# Patient Record
Sex: Female | Born: 1960 | Race: White | Hispanic: No | Smoking: Never smoker
Health system: Southern US, Community
[De-identification: ages and names within clinical notes are randomized; demographics above are authoritative.]

## PROBLEM LIST (undated history)

## (undated) DIAGNOSIS — K219 Gastro-esophageal reflux disease without esophagitis: Secondary | ICD-10-CM

## (undated) DIAGNOSIS — N179 Acute kidney failure, unspecified: Secondary | ICD-10-CM

## (undated) DIAGNOSIS — E039 Hypothyroidism, unspecified: Secondary | ICD-10-CM

## (undated) DIAGNOSIS — E0500 Thyrotoxicosis with diffuse goiter without thyrotoxic crisis or storm: Secondary | ICD-10-CM

## (undated) DIAGNOSIS — I10 Essential (primary) hypertension: Secondary | ICD-10-CM

## (undated) DIAGNOSIS — G479 Sleep disorder, unspecified: Secondary | ICD-10-CM

## (undated) DIAGNOSIS — E7800 Pure hypercholesterolemia, unspecified: Secondary | ICD-10-CM

## (undated) DIAGNOSIS — F419 Anxiety disorder, unspecified: Secondary | ICD-10-CM

## (undated) DIAGNOSIS — E785 Hyperlipidemia, unspecified: Secondary | ICD-10-CM

## (undated) HISTORY — PX: OTHER SURGICAL HISTORY: SHX169

## (undated) HISTORY — DX: Anxiety disorder, unspecified: F41.9

## (undated) HISTORY — DX: Gastro-esophageal reflux disease without esophagitis: K21.9

## (undated) HISTORY — DX: Thyrotoxicosis with diffuse goiter without thyrotoxic crisis or storm: E05.00

## (undated) HISTORY — DX: Pure hypercholesterolemia, unspecified: E78.00

## (undated) HISTORY — DX: Hypothyroidism, unspecified: E03.9

## (undated) HISTORY — DX: Essential (primary) hypertension: I10

## (undated) HISTORY — DX: Hyperlipidemia, unspecified: E78.5

## (undated) HISTORY — PX: CARPAL TUNNEL RELEASE: SHX101

## (undated) HISTORY — DX: Sleep disorder, unspecified: G47.9

---
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
## 2011-06-13 ENCOUNTER — Other Ambulatory Visit (HOSPITAL_BASED_OUTPATIENT_CLINIC_OR_DEPARTMENT_OTHER): Payer: Self-pay | Admitting: Family Medicine

## 2011-06-13 DIAGNOSIS — E041 Nontoxic single thyroid nodule: Secondary | ICD-10-CM

## 2011-06-15 ENCOUNTER — Ambulatory Visit (HOSPITAL_BASED_OUTPATIENT_CLINIC_OR_DEPARTMENT_OTHER)
Admission: RE | Admit: 2011-06-15 | Discharge: 2011-06-15 | Disposition: A | Discharge status: Home / Self Care | Payer: 59 | Source: Ambulatory Visit | Attending: Family Medicine | Admitting: Family Medicine

## 2011-06-15 DIAGNOSIS — E0500 Thyrotoxicosis with diffuse goiter without thyrotoxic crisis or storm: Secondary | ICD-10-CM | POA: Insufficient documentation

## 2011-06-15 DIAGNOSIS — E041 Nontoxic single thyroid nodule: Secondary | ICD-10-CM

## 2013-05-08 ENCOUNTER — Ambulatory Visit: Payer: 59 | Admitting: Cardiology

## 2013-05-15 ENCOUNTER — Ambulatory Visit: Payer: 59 | Admitting: Cardiology

## 2013-05-21 ENCOUNTER — Encounter: Payer: Self-pay | Admitting: Cardiology

## 2013-05-21 ENCOUNTER — Encounter: Payer: Self-pay | Admitting: *Deleted

## 2013-05-21 DIAGNOSIS — E039 Hypothyroidism, unspecified: Secondary | ICD-10-CM | POA: Insufficient documentation

## 2013-05-21 DIAGNOSIS — E785 Hyperlipidemia, unspecified: Secondary | ICD-10-CM | POA: Insufficient documentation

## 2013-05-21 DIAGNOSIS — E7800 Pure hypercholesterolemia, unspecified: Secondary | ICD-10-CM | POA: Insufficient documentation

## 2013-05-21 DIAGNOSIS — K219 Gastro-esophageal reflux disease without esophagitis: Secondary | ICD-10-CM | POA: Insufficient documentation

## 2013-05-21 DIAGNOSIS — F419 Anxiety disorder, unspecified: Secondary | ICD-10-CM | POA: Insufficient documentation

## 2013-05-21 DIAGNOSIS — E0500 Thyrotoxicosis with diffuse goiter without thyrotoxic crisis or storm: Secondary | ICD-10-CM | POA: Insufficient documentation

## 2013-05-21 DIAGNOSIS — G479 Sleep disorder, unspecified: Secondary | ICD-10-CM | POA: Insufficient documentation

## 2013-05-23 ENCOUNTER — Ambulatory Visit (INDEPENDENT_AMBULATORY_CARE_PROVIDER_SITE_OTHER): Payer: 59 | Admitting: Cardiology

## 2013-05-23 ENCOUNTER — Encounter: Payer: Self-pay | Admitting: Cardiology

## 2013-05-23 ENCOUNTER — Encounter (INDEPENDENT_AMBULATORY_CARE_PROVIDER_SITE_OTHER): Payer: Self-pay

## 2013-05-23 VITALS — BP 130/92 | HR 82 | Ht 65.0 in | Wt 169.4 lb

## 2013-05-23 DIAGNOSIS — F411 Generalized anxiety disorder: Secondary | ICD-10-CM

## 2013-05-23 DIAGNOSIS — Z8249 Family history of ischemic heart disease and other diseases of the circulatory system: Secondary | ICD-10-CM

## 2013-05-23 DIAGNOSIS — F419 Anxiety disorder, unspecified: Secondary | ICD-10-CM

## 2013-05-23 DIAGNOSIS — I10 Essential (primary) hypertension: Secondary | ICD-10-CM

## 2013-05-23 NOTE — Patient Instructions (Signed)
Your physician recommends that you continue on your current medications as directed. Please refer to the Current Medication list given to you today.  Your physician wants you to follow-up in: 1 year with Dr. Skains. You will receive a reminder letter in the mail two months in advance. If you don't receive a letter, please call our office to schedule the follow-up appointment.  

## 2013-05-23 NOTE — Progress Notes (Signed)
[address] [place], [place]  [address] Phone: [phone] Fax:  [phone]  Date:  05/23/2013   ID:  Kathryn [name], DOB 10/19/1960, MRN [id number]  PCP:  Joycelyn Rua, MD   History of Present Illness: Kathryn [name] is a 52 y.o. female with family hx of CAD with mother and brother having myocardial infarction at age 35, and she also has hx of hyperlipidemia, hypertension,and insulin resistance.  08/26/12 she saw Lovenia Kim who added norvasc 5 mg due to hypertension. She states she feels it has not really helped and that for the last few weeks she has been getting blood pressure readings in the 175-212/116-129 range.   On 09/02/12 with BP still elevated her Amlodipine was further increased to 10 mg po qd. Readings since then 160/104, 148/102, 177/105, She is very anxious, has headaches and feels exhausted. She previously was on Losartan HCT 100/25 mg qd and was switched due to her formulary and she is now on Triamterene/HCTZ, without losartan since 2/14.  10/03/12-blood pressure is better but still elevated at times. Fluctuating. I decided to increase her carvedilol to 12.5 mg twice a day. Continue with amlodipine 10 mg a day. Her heart rate should be able to tolerate this. Patient denies chest pain, palpitations, syncope, swelling, SOB, nor PND  05/23/13-unfortunately had labrum tear, biceps tear and currently is in a sling, right-sided. Undergoing physical therapy. No problems with chest pain, shortness of breath, syncope. Her blood pressure is actually under pretty good control.    Wt Readings from Last 3 Encounters:  05/23/13 169 lb 6.4 oz (76.839 kg)     Past Medical History  Diagnosis Date  . GERD (gastroesophageal reflux disease)   . Hyperlipidemia   . Hypothyroidism   . Anxiety   . Sleep disturbance   . Hypercholesteremia   . Graves disease     with hyperthyroidism diagnosed at age 89 (treated with propylthiouracil for 2 years)    Past Surgical  History  Procedure Laterality Date  . Rt ovary and fallopian tube removed due to cysts    . Carpal tunnel release      Current Outpatient Prescriptions  Medication Sig Dispense Refill  . ALPRAZolam (XANAX) 0.5 MG tablet Take 0.5 mg by mouth at bedtime as needed for anxiety.      Marland Kitchen amLODipine (NORVASC) 10 MG tablet Take 10 mg by mouth daily.      Marland Kitchen aspirin 81 MG tablet Take 81 mg by mouth daily.      . B Complex-C (SUPER B COMPLEX PO) Take 1 tablet by mouth daily.      Marland Kitchen BLACK COHOSH PO Take 135 mg by mouth daily.      . carvedilol (COREG) 6.25 MG tablet Take 6.25 mg by mouth 2 (two) times daily with a meal.      . cholecalciferol (VITAMIN D) 1000 UNITS tablet Take 1,000 Units by mouth 2 (two) times daily.      Marland Kitchen desvenlafaxine (PRISTIQ) 50 MG 24 hr tablet Take 50 mg by mouth daily.      Marland Kitchen estrogens, conjugated, (PREMARIN) 0.625 MG tablet Take 0.625 mg by mouth once a week. Take daily for 21 days then do not take for 7 days.      Marland Kitchen levothyroxine (SYNTHROID, LEVOTHROID) 75 MCG tablet Take 75 mcg by mouth daily before breakfast.      . lisdexamfetamine (VYVANSE) 40 MG capsule Take 40 mg by mouth every morning.      Marland Kitchen  losartan (COZAAR) 100 MG tablet Take 100 mg by mouth daily.      Marland Kitchen lovastatin (MEVACOR) 40 MG tablet Take 40 mg by mouth at bedtime.      . metFORMIN (GLUCOPHAGE-XR) 750 MG 24 hr tablet Take 750 mg by mouth daily with breakfast.      . traZODone (DESYREL) 100 MG tablet Take 50 mg by mouth as needed for sleep.      Marland Kitchen triamterene-hydrochlorothiazide (DYAZIDE) 37.5-25 MG per capsule Take 1 capsule by mouth daily.      . valACYclovir (VALTREX) 500 MG tablet Take 500 mg by mouth as needed.       No current facility-administered medications for this visit.    Allergies:   Not on File  Social History:  The patient   is a nonsmoker  ROS:  Please see the history of present illness.   Denies any syncope, bleeding, orthopnea, PND. Occasional ankle edema.    PHYSICAL EXAM: VS:  BP  130/92  Pulse 82  Ht 5\' 5"  (1.651 m)  Wt 169 lb 6.4 oz (76.839 kg)  BMI 28.19 kg/m2 Well nourished, well developed, in no acute distress HEENT: normal Neck: no JVD Cardiac:  normal S1, S2; RRR; no murmur Lungs:  clear to auscultation bilaterally, no wheezing, rhonchi or rales Abd: soft, nontender, no hepatomegaly Ext: no edema Skin: warm and dry Neuro: no focal abnormalities noted  EKG:  Sinus rhythm, rate 82.  Nuclear stress test-2012-no ischemia, normal EF  ASSESSMENT AND PLAN:  1. Strong family history of CAD-mother and brother MI at age 98. Continue with aggressive primary prevention. Blood pressure control, statin. Exercise. 2. Hypertension-currently well controlled. It is likely that some of her mild ankle edema is secondary from amlodipine side effect. Nonetheless, continue.  Signed, Donato Schultz, MD [facility]  05/23/2013 2:59 PM

## 2013-07-22 ENCOUNTER — Other Ambulatory Visit: Payer: Self-pay | Admitting: Obstetrics and Gynecology

## 2013-07-22 DIAGNOSIS — R928 Other abnormal and inconclusive findings on diagnostic imaging of breast: Secondary | ICD-10-CM

## 2013-07-30 ENCOUNTER — Ambulatory Visit
Admission: RE | Admit: 2013-07-30 | Discharge: 2013-07-30 | Disposition: A | Discharge status: Home / Self Care | Payer: No Typology Code available for payment source | Source: Ambulatory Visit | Attending: Obstetrics and Gynecology | Admitting: Obstetrics and Gynecology

## 2013-07-30 DIAGNOSIS — R928 Other abnormal and inconclusive findings on diagnostic imaging of breast: Secondary | ICD-10-CM

## 2013-11-27 ENCOUNTER — Other Ambulatory Visit (HOSPITAL_COMMUNITY): Payer: Self-pay | Admitting: Ophthalmology

## 2013-11-27 ENCOUNTER — Ambulatory Visit (HOSPITAL_COMMUNITY)
Admission: RE | Admit: 2013-11-27 | Discharge: 2013-11-27 | Disposition: A | Discharge status: Home / Self Care | Payer: No Typology Code available for payment source | Source: Ambulatory Visit | Attending: Ophthalmology | Admitting: Ophthalmology

## 2013-11-27 DIAGNOSIS — G319 Degenerative disease of nervous system, unspecified: Secondary | ICD-10-CM | POA: Insufficient documentation

## 2013-11-27 DIAGNOSIS — J3489 Other specified disorders of nose and nasal sinuses: Secondary | ICD-10-CM | POA: Insufficient documentation

## 2013-11-27 DIAGNOSIS — H469 Unspecified optic neuritis: Secondary | ICD-10-CM | POA: Insufficient documentation

## 2013-11-27 DIAGNOSIS — H4710 Unspecified papilledema: Secondary | ICD-10-CM

## 2013-11-27 DIAGNOSIS — R930 Abnormal findings on diagnostic imaging of skull and head, not elsewhere classified: Secondary | ICD-10-CM | POA: Insufficient documentation

## 2013-11-27 MED ORDER — GADOBENATE DIMEGLUMINE 529 MG/ML IV SOLN
15.0000 mL | Freq: Once | INTRAVENOUS | Status: AC | PRN
  Administered 2013-11-27: 15 mL via INTRAVENOUS

## 2013-12-04 ENCOUNTER — Inpatient Hospital Stay (HOSPITAL_COMMUNITY)
Admission: AD | Admit: 2013-12-04 | Payer: No Typology Code available for payment source | Source: Ambulatory Visit | Admitting: Internal Medicine

## 2013-12-05 ENCOUNTER — Ambulatory Visit (INDEPENDENT_AMBULATORY_CARE_PROVIDER_SITE_OTHER): Payer: No Typology Code available for payment source | Admitting: Neurology

## 2013-12-05 ENCOUNTER — Encounter: Payer: Self-pay | Admitting: Neurology

## 2013-12-05 VITALS — BP 138/80 | HR 81 | Ht 65.0 in | Wt 173.1 lb

## 2013-12-05 DIAGNOSIS — H469 Unspecified optic neuritis: Secondary | ICD-10-CM | POA: Insufficient documentation

## 2013-12-05 NOTE — Progress Notes (Signed)
NEUROLOGY CONSULTATION NOTE  Kathryn [name] MRN: [id number] DOB: 02/15/1981  Referring provider: Dr. Stephannie Li Primary care provider: Dr. Joycelyn Rua  Reason for consult:  Optic neuritis  Dear Dr Allyne Gee:  Thank you for your kind referral of Kathryn [name] for consultation of the above symptoms. Although her history is well known to you, please allow me to reiterate it for the purpose of our medical record. The patient was accompanied to the clinic by her husband who also provides collateral information. Records and images were personally reviewed where available.  HISTORY OF PRESENT ILLNESS: This is a pleasant 53 year old right-handed woman with vascular risk factors including hypertension, hyperlipidemia, and "insulin resistance" on Metformin, presenting for evaluation of right optic neuritis.  She woke up with a change in her vision on the right eye last 11/17/2013.  She denied any eye pain, stating that there was a sheer curtain in her vision that made everything look gray or brown.  She could not make out the details behind the sheer curtain.  She had seen retina specialist Dr. Allyne Gee, diagnosed with optic neuritis OD, with note of visual acuity of 20/800 OD and subtle APD.  She returned a week later on 12/03/2013 reporting that the grayish brown spot was bigger that she could see clearly only above the person's hairline. Visual acuity was improved at 20/350-1.  She started having bilateral temporal headaches, more on the right side, occurring multiple times a day, lasting up to 2 hours.  There is no associated nausea/vomiting, she has some sensitivity to bright lights and loud sounds. She has been taking Ibuprofen 2 tabs twice a day for the past 2 weeks.  The left eye is unaffected.  She denies any focal numbness/tingling/weakness, but reports that she occasionally has numbness in both feet when her ankles swell up.  She does feel the grip on her right hand is weaker, but has  attributed this to right rotator cuff surgery last October 2014.  She denies any prior history of vision loss.  She denies any neck/back pain, dysarthria/dysphagia, negative Lhermitte's sign.  No bowel/bladder dysfunction except for urinary urgency. No family history of similar symptoms.  IV steroids have been arranged by her ophthalmologist, and she had just received the first dose of IV Solumedrol 250mg  every 6 hours this morning. She will be administering the medication herself.  She has had hypertension and hyperlipidemia for at least 10 years. She reports that last year her BP was fluctuating and labile, but recently better controlled. She has a history of spinal meningitis in 1992 when she had headaches and neck pain.  I personally reviewed MRI brain and orbits with and without contrast done 11/27/2013 which showed bilateral non-enhancing small foci of white matter T2/FLAIR signal abnormality, none clearly seen radially along the corpus callosum.  There was no clear abnormal signal seen in the right optic nerve.  PAST MEDICAL HISTORY: Past Medical History  Diagnosis Date  . GERD (gastroesophageal reflux disease)   . Hyperlipidemia   . Hypothyroidism   . Anxiety   . Sleep disturbance   . Hypercholesteremia   . Graves disease     with hyperthyroidism diagnosed at age 18 (treated with propylthiouracil for 2 years)    PAST SURGICAL HISTORY: Past Surgical History  Procedure Laterality Date  . Rt ovary and fallopian tube removed due to cysts    . Carpal tunnel release      MEDICATIONS: Current Outpatient Prescriptions on File Prior to Visit  Medication Sig Dispense Refill  . ALPRAZolam (XANAX) 0.5 MG tablet Take 0.5 mg by mouth at bedtime as needed for anxiety.      Marland Kitchen amLODipine (NORVASC) 10 MG tablet Take 10 mg by mouth daily.      Marland Kitchen aspirin 81 MG tablet Take 81 mg by mouth daily.      . B Complex-C (SUPER B COMPLEX PO) Take 1 tablet by mouth daily.      Marland Kitchen BLACK COHOSH PO Take 135  mg by mouth daily.      . carvedilol (COREG) 6.25 MG tablet Take 6.25 mg by mouth 2 (two) times daily with a meal.      . cholecalciferol (VITAMIN D) 1000 UNITS tablet Take 1,000 Units by mouth 2 (two) times daily.      Marland Kitchen desvenlafaxine (PRISTIQ) 50 MG 24 hr tablet Take 50 mg by mouth daily.      Marland Kitchen estrogens, conjugated, (PREMARIN) 0.625 MG tablet Take 0.625 mg by mouth once a week. Take daily for 21 days then do not take for 7 days.      Marland Kitchen levothyroxine (SYNTHROID, LEVOTHROID) 75 MCG tablet Take 75 mcg by mouth daily before breakfast.      . lisdexamfetamine (VYVANSE) 40 MG capsule Take 40 mg by mouth every morning.      Marland Kitchen losartan (COZAAR) 100 MG tablet Take 100 mg by mouth daily.      Marland Kitchen lovastatin (MEVACOR) 40 MG tablet Take 40 mg by mouth at bedtime.      . metFORMIN (GLUCOPHAGE-XR) 750 MG 24 hr tablet Take 750 mg by mouth daily with breakfast.      . traZODone (DESYREL) 100 MG tablet Take 50 mg by mouth as needed for sleep.      Marland Kitchen triamterene-hydrochlorothiazide (DYAZIDE) 37.5-25 MG per capsule Take 1 capsule by mouth daily.      . valACYclovir (VALTREX) 500 MG tablet Take 500 mg by mouth as needed.       No current facility-administered medications on file prior to visit.    ALLERGIES: No Known Allergies  FAMILY HISTORY: History reviewed. No pertinent family history.  SOCIAL HISTORY: History   Social History  . Marital Status: Married    Spouse Name: N/A    Number of Children: N/A  . Years of Education: N/A   Occupational History  . Not on file.   Social History Main Topics  . Smoking status: Never Smoker   . Smokeless tobacco: Not on file  . Alcohol Use: Yes  . Drug Use: No  . Sexual Activity: Not on file   Other Topics Concern  . Not on file   Social History Narrative  . No narrative on file    REVIEW OF SYSTEMS: Constitutional: No fevers, chills, or sweats, no generalized fatigue, change in appetite Eyes: as above Ear, nose and throat: No hearing loss, ear  pain, nasal congestion, sore throat Cardiovascular: No chest pain, palpitations Respiratory:  No shortness of breath at rest or with exertion, wheezes GastrointestinaI: No nausea, vomiting, diarrhea, abdominal pain, fecal incontinence Genitourinary:  No dysuria, urinary retention or frequency Musculoskeletal:  No neck pain, back pain Integumentary: No rash, pruritus, skin lesions Neurological: as above Psychiatric: No depression, insomnia, anxiety Endocrine: No palpitations, fatigue, diaphoresis, mood swings, change in appetite, change in weight, increased thirst Hematologic/Lymphatic:  No anemia, purpura, petechiae. Allergic/Immunologic: no itchy/runny eyes, nasal congestion, recent allergic reactions, rashes  PHYSICAL EXAM: Filed Vitals:   12/05/13 1609  BP: 138/80  Pulse: 81  General: No acute distress Head:  Normocephalic/atraumatic Eyes: Fundoscopic exam shows blurred disc on the right, sharp on left.   Neck: supple, no paraspinal tenderness, full range of motion Back: No paraspinal tenderness Heart: regular rate and rhythm Lungs: Clear to auscultation bilaterally. Vascular: No carotid bruits. Skin/Extremities: No rash, no edema Neurological Exam: Mental status: alert and oriented to person, place, and time, no dysarthria or aphasia, Fund of knowledge is appropriate.  Recent and remote memory are intact.  Attention and concentration are normal.    Able to name objects and repeat phrases. Cranial nerves: CN I: not tested CN II: pupils equal, round and reactive to light, no APD noted today. visual fields intact but has some difficulty with inferior field on the right eye. Fundoscopy as above CN III, IV, VI:  full range of motion (no pain with eye movements), no nystagmus, no ptosis CN V: facial sensation intact CN VII: upper and lower face symmetric CN VIII: hearing intact to finger rub CN IX, X: gag intact, uvula midline CN XI: sternocleidomastoid and trapezius muscles  intact CN XII: tongue midline Bulk & Tone: normal, no fasciculations. Motor: 5/5 throughout with no pronator drift. Sensation: intact to light touch, cold, pin, vibration and joint position sense.  No extinction to double simultaneous stimulation.  Romberg test negative Deep Tendon Reflexes: +2 throughout, no ankle clonus Plantar responses: downgoing bilaterally Cerebellar: no incoordination on finger to nose, heel to shin. No dysdiadochokinesia Gait: narrow-based and steady, able to tandem walk adequately. Tremor: none  IMPRESSION: This is a pleasant 53 year old right-handed woman with a history of hypertension, hyperlipidemia, insulin resistance on Metformin, presenting with right optic neuritis with abnormal MRI brain.  I reviewed the MRI brain with and without contrast with the patient and her husband.  There were several bilateral white matter lesions that were non-enhancing, although there was a large amount of these, similar findings can be seen in patients with chronic small vessel disease.  Certainly in a patient with new optic neuritis, MS is a consideration, and further evaluation with an MRI C-spine and T-spine with and without contrast to evaluate for spinal lesions will be ordered.  A lumbar puncture under fluoroscopy to send for CSF cell count, protein, glucose, and oligoclonal bands will be ordered, in addition to other MS mimickers.  Bloodwork for HbA1c, TSH, CRP, vitamin B12, vitamin D, ANA, ACE level, RPR, NMO Ab will be ordered.  We discussed the typical prognosis of optic neuritis, vision usually beings to improve over a few weeks and can continue over months, IV steroids can hasten clinical improvement, however long-term visual outcome has not been found to be affected by this treatment.  She will finish the IV steroid course and  follow-up after the tests.  Thank you for allowing me to participate in the care of this patient. Please do not hesitate to call for any questions or  concerns.   Patrcia DollyKaren Aquino, M.D.  CC: Dr. Stephannie LiJason Sanders

## 2013-12-05 NOTE — Patient Instructions (Signed)
1. MRI cervical and thoracic spine with and without contrast 2. Schedule lumbar puncture 3. Continue IV steroids 4 .Minimize Ibuprofen intake

## 2013-12-09 ENCOUNTER — Other Ambulatory Visit: Payer: Self-pay | Admitting: Radiology

## 2013-12-09 ENCOUNTER — Encounter: Payer: Self-pay | Admitting: Neurology

## 2013-12-09 ENCOUNTER — Telehealth: Payer: Self-pay | Admitting: Neurology

## 2013-12-09 NOTE — Telephone Encounter (Signed)
Delice Bison from [facility] needs to have notes faxed to her at [phone] phone number is [phone]

## 2013-12-09 NOTE — Telephone Encounter (Signed)
Please let them know that note is done. Also, can you pls order the bloodwork I detailed on my note. Thanks!!!

## 2013-12-09 NOTE — Telephone Encounter (Signed)
[facility] health radiology department requesting a finished note. Patient not seen until Friday advise them to check again by Thursday evening patient was just seen on Friday afternoon.

## 2013-12-10 ENCOUNTER — Other Ambulatory Visit: Payer: Self-pay | Admitting: *Deleted

## 2013-12-10 ENCOUNTER — Other Ambulatory Visit: Payer: Self-pay | Admitting: Neurology

## 2013-12-10 ENCOUNTER — Telehealth: Payer: Self-pay | Admitting: Neurology

## 2013-12-10 DIAGNOSIS — H469 Unspecified optic neuritis: Secondary | ICD-10-CM

## 2013-12-10 LAB — C-REACTIVE PROTEIN: CRP: <0.5 mg/dL (ref <0.60)

## 2013-12-10 LAB — VITAMIN B12: VITAMIN B 12: 1326 pg/mL — AB (ref 211–911)

## 2013-12-10 LAB — TSH: TSH: 3.042 u[IU]/mL (ref 0.350–4.500)

## 2013-12-10 LAB — HEMOGLOBIN A1C
HEMOGLOBIN A1C: 5.9 % — AB (ref <5.7)
Mean Plasma Glucose: 123 mg/dL — ABNORMAL HIGH (ref <117)

## 2013-12-10 NOTE — Telephone Encounter (Signed)
Pt came by the office to pick up her lab work and wanted to ask what she could do or take for her headaches.

## 2013-12-10 NOTE — Telephone Encounter (Signed)
Please advise 

## 2013-12-10 NOTE — Telephone Encounter (Signed)
Patient notified about extra labs requested

## 2013-12-10 NOTE — Telephone Encounter (Signed)
Left voicemail, can try Aleve 2 tabs BID for now. If not effective, will consider Tramadol.

## 2013-12-11 LAB — RPR

## 2013-12-11 LAB — ANGIOTENSIN CONVERTING ENZYME: ANGIOTENSIN-CONVERTING ENZYME: 17 U/L (ref 8–52)

## 2013-12-11 LAB — VITAMIN D 25 HYDROXY (VIT D DEFICIENCY, FRACTURES): VIT D 25 HYDROXY: 44 ng/mL (ref 30–89)

## 2013-12-11 LAB — ANA: Anti Nuclear Antibody(ANA): NEGATIVE

## 2013-12-11 NOTE — Telephone Encounter (Signed)
Finished the IV steroids last Monday. Has HAs bounding all over the place. Has not noticed any improvement in the eye. Started yesterday on oral prednisone 20mg  TID (30 tabs), no taper instructions given by ophtho. She will call about instructions.  She had a 2-hour HA-free period this morning.  She is scheduled for LP tomorrow. Can use prn Tylenol while on steroids. She knows to call to update [place].

## 2013-12-12 ENCOUNTER — Ambulatory Visit (HOSPITAL_COMMUNITY)
Admission: RE | Admit: 2013-12-12 | Discharge: 2013-12-12 | Disposition: A | Discharge status: Home / Self Care | Payer: No Typology Code available for payment source | Source: Ambulatory Visit | Attending: Neurology | Admitting: Neurology

## 2013-12-12 DIAGNOSIS — H469 Unspecified optic neuritis: Secondary | ICD-10-CM | POA: Diagnosis not present

## 2013-12-12 LAB — GLUCOSE, CSF: Glucose, CSF: 75 mg/dL (ref 43–76)

## 2013-12-12 LAB — CSF CELL COUNT WITH DIFFERENTIAL
RBC Count, CSF: 3 /mm3 — ABNORMAL HIGH
Tube #: 3
WBC, CSF: 3 /mm3 (ref 0–5)

## 2013-12-12 LAB — PROTEIN, CSF: Total  Protein, CSF: 36 mg/dL (ref 15–45)

## 2013-12-12 NOTE — Discharge Instructions (Signed)
Lumbar Puncture Discharge Instructions ° °1. Go home and rest quietly for the next 24 hours.  It is important to lie flat for the next 24 hours.  Get up only to go to the restroom.  You may lie in the bed or on a couch on your back, your stomach, your left side or your right side.  You may have one pillow under your head.  You may have pillows between your knees while you are on your side or under your knees while you are on your back. ° °2. DO NOT drive today.  Recline the seat as far back as it will go, while still wearing your seat belt, on the way home. ° °3. You may get up to go to the bathroom as needed.  You may sit up for 10 minutes to eat.  You may resume your normal diet and medications unless otherwise indicated. ° °4. The incidence of headache, nausea, or vomiting is about 5% (one in 20 patients).  If you develop a headache, lie flat and drink plenty of fluids until the headache goes away.  Caffeinated beverages may be helpful.  If you develop severe nausea and vomiting or a headache that does not go away with flat bed rest, call [phone]. ° °5. You may resume normal activities after your 24 hours of bed rest is over; however, do not exert yourself strongly or do any heavy lifting tomorrow. ° °6. Call your physician for a follow-up appointment.  The results of your myelogram will be sent directly to your physician by the following day. ° °7. If you have any questions or if complications develop after you arrive home, please call [phone]. ° °Discharge instructions have been explained to the patient.  The patient, or the person responsible for the patient, fully understands these instructions. ° ° °

## 2013-12-12 NOTE — Procedures (Signed)
CLINICAL DATA: [Optic neuritis, possible multiple sclerosis.]  EXAM:  DIAGNOSTIC LUMBAR PUNCTURE UNDER FLUOROSCOPIC GUIDANCE  FLUOROSCOPY TIME: [0 min, 22 seconds]  PROCEDURE:  I discussed the risks (including hemorrhage, infection, headache, and nerve damage, among others), benefits, and alternatives to fluoroscopically guided lumbar puncture with the patient.  We specifically discussed the high technical likelihood of success of the procedure. The patient understood and elected to undergo the procedure.      Standard time-out was employed.  Following sterile skin prep and local anesthetic administration consisting of 1 percent lidocaine, a 22 gauge spinal needle was advanced without difficulty into the thecal sac at the at the [L3-4] level.  Clear CSF was returned.  Opening pressure was [19 cm of water].      18 cc of clear CSF was collected.  The needle was subsequently removed and the skin cleansed and bandaged.  No immediate complications were observed.       IMPRESSION: [ Technically successful fluoroscopically guided lumbar puncture yielding 18  cc of clear CSF for testing.  Opening pressure was 19 cm of water.   ]

## 2013-12-14 LAB — ANGIOTENSIN CONVERTING ENZYME, CSF: ANGIO CONVERT ENZYME: 3 U/L (ref <=15)

## 2013-12-15 LAB — MYELIN BASIC PROTEIN, CSF

## 2013-12-16 LAB — NEUROMYELITIS OPTICA AUTOAB, IGG: NMO-IgG: NEGATIVE

## 2013-12-17 LAB — CSF IGG: IGG CSF: 2.4 mg/dL (ref 0.8–7.7)

## 2013-12-19 ENCOUNTER — Ambulatory Visit (HOSPITAL_COMMUNITY)
Admission: RE | Admit: 2013-12-19 | Discharge: 2013-12-19 | Disposition: A | Discharge status: Home / Self Care | Payer: 59 | Source: Ambulatory Visit | Attending: Neurology | Admitting: Neurology

## 2013-12-19 ENCOUNTER — Ambulatory Visit (HOSPITAL_COMMUNITY): Payer: 59

## 2013-12-19 DIAGNOSIS — M5124 Other intervertebral disc displacement, thoracic region: Secondary | ICD-10-CM | POA: Insufficient documentation

## 2013-12-19 DIAGNOSIS — R209 Unspecified disturbances of skin sensation: Secondary | ICD-10-CM | POA: Insufficient documentation

## 2013-12-19 DIAGNOSIS — M5030 Other cervical disc degeneration, unspecified cervical region: Secondary | ICD-10-CM | POA: Insufficient documentation

## 2013-12-19 DIAGNOSIS — N289 Disorder of kidney and ureter, unspecified: Secondary | ICD-10-CM | POA: Diagnosis not present

## 2013-12-19 DIAGNOSIS — H469 Unspecified optic neuritis: Secondary | ICD-10-CM

## 2013-12-19 DIAGNOSIS — M5020 Other cervical disc displacement, unspecified cervical region: Secondary | ICD-10-CM | POA: Diagnosis not present

## 2013-12-19 LAB — OLIGOCLONAL BANDS, CSF + SERM

## 2013-12-19 MED ORDER — GADOBENATE DIMEGLUMINE 529 MG/ML IV SOLN
16.0000 mL | Freq: Once | INTRAVENOUS | Status: AC | PRN
  Administered 2013-12-19: 16 mL via INTRAVENOUS

## 2013-12-23 LAB — B. BURGDORFI ANTIBODIES, CSF: Lyme Ab: NEGATIVE

## 2014-01-02 ENCOUNTER — Ambulatory Visit (INDEPENDENT_AMBULATORY_CARE_PROVIDER_SITE_OTHER): Payer: No Typology Code available for payment source | Admitting: Neurology

## 2014-01-02 ENCOUNTER — Encounter: Payer: Self-pay | Admitting: Neurology

## 2014-01-02 VITALS — BP 140/80 | HR 101 | Ht 65.0 in | Wt 173.1 lb

## 2014-01-02 DIAGNOSIS — H469 Unspecified optic neuritis: Secondary | ICD-10-CM

## 2014-01-02 NOTE — Progress Notes (Signed)
NEUROLOGY FOLLOW UP OFFICE NOTE  Kathryn [name] [id number]  HISTORY OF PRESENT ILLNESS: I had the pleasure of seeing Kathryn [name] in follow-up in the neurology clinic on 01/02/2014.  The patient was last seen a month ago for right optic neuritis.  She is accompanied by her sister today.  Records and images were personally reviewed and reviewed with the patient.  Her MRI cervical and thoracic spine with and without contrast did not show any demyelinating lesions.  She underwent a lumbar puncture which was normal, no oligoclonal bands seen.  Her NMO antibody was negative.    She reports that her vision on the right eye is unchanged.  She has seen her eye doctor and has been told the inflammation has reduced, however she continues to have gray reduced vision after completing IV and PO steroids.  She denies any new symptoms, no focal numbness/tingling/weakness, no eye pain.  The headaches have improved.    PAST MEDICAL HISTORY: Past Medical History  Diagnosis Date  . GERD (gastroesophageal reflux disease)   . Hyperlipidemia   . Hypothyroidism   . Anxiety   . Sleep disturbance   . Hypercholesteremia   . Graves disease     with hyperthyroidism diagnosed at age 53 (treated with propylthiouracil for 2 years)    MEDICATIONS: Current Outpatient Prescriptions on File Prior to Visit  Medication Sig Dispense Refill  . ALPRAZolam (XANAX) 0.5 MG tablet Take 0.5 mg by mouth at bedtime as needed for anxiety.      Marland Kitchen. amLODipine (NORVASC) 10 MG tablet Take 10 mg by mouth daily.      Marland Kitchen. aspirin 81 MG tablet Take 81 mg by mouth daily.      . B Complex-C (SUPER B COMPLEX PO) Take 1 tablet by mouth daily.      Marland Kitchen. BLACK COHOSH PO Take 135 mg by mouth daily.      . carvedilol (COREG) 6.25 MG tablet Take 6.25 mg by mouth 2 (two) times daily with a meal.      . cholecalciferol (VITAMIN D) 1000 UNITS tablet Take 2,000 Units by mouth daily.       Marland Kitchen. desvenlafaxine (PRISTIQ) 50 MG 24 hr tablet Take 50 mg by mouth  daily.      Marland Kitchen. estrogens, conjugated, (PREMARIN) 0.625 MG tablet Take 0.625 mg by mouth once a week. Take daily for 21 days then do not take for 7 days.      Marland Kitchen. levothyroxine (SYNTHROID, LEVOTHROID) 75 MCG tablet Take 75 mcg by mouth daily before breakfast.      . lisdexamfetamine (VYVANSE) 40 MG capsule Take 40 mg by mouth every morning.      Marland Kitchen. losartan (COZAAR) 100 MG tablet Take 100 mg by mouth daily.      Marland Kitchen. lovastatin (MEVACOR) 40 MG tablet Take 40 mg by mouth at bedtime.      . Melatonin 5 MG TABS Take 5 mg by mouth at bedtime as needed (for sleep).      . metFORMIN (GLUCOPHAGE-XR) 750 MG 24 hr tablet Take 750 mg by mouth daily with breakfast.      . predniSONE (DELTASONE) 20 MG tablet Take 20 mg by mouth 3 (three) times daily. For 10 days. Started 12/10/13      . traZODone (DESYREL) 100 MG tablet Take 50 mg by mouth as needed for sleep.      Marland Kitchen. triamterene-hydrochlorothiazide (DYAZIDE) 37.5-25 MG per capsule Take 1 capsule by mouth daily.      .Marland Kitchen  valACYclovir (VALTREX) 500 MG tablet Take 500 mg by mouth daily as needed (for outbreaks).        No current facility-administered medications on file prior to visit.    ALLERGIES: Allergies  Allergen Reactions  . Tetracyclines & Related Nausea And Vomiting    FAMILY HISTORY: History reviewed. No pertinent family history.  SOCIAL HISTORY: History   Social History  . Marital Status: Married    Spouse Name: N/A    Number of Children: N/A  . Years of Education: N/A   Occupational History  . Not on file.   Social History Main Topics  . Smoking status: Never Smoker   . Smokeless tobacco: Not on file  . Alcohol Use: Yes  . Drug Use: No  . Sexual Activity: Not on file   Other Topics Concern  . Not on file   Social History Narrative  . No narrative on file    REVIEW OF SYSTEMS: Constitutional: No fevers, chills, or sweats, no generalized fatigue, change in appetite Eyes: as above Ear, nose and throat: No hearing loss, ear pain,  nasal congestion, sore throat Cardiovascular: No chest pain, palpitations Respiratory:  No shortness of breath at rest or with exertion, wheezes GastrointestinaI: No nausea, vomiting, diarrhea, abdominal pain, fecal incontinence Genitourinary:  No dysuria, urinary retention or frequency Musculoskeletal:  No neck pain, back pain Integumentary: No rash, pruritus, skin lesions Neurological: as above Psychiatric: No depression, insomnia, anxiety Endocrine: No palpitations, fatigue, diaphoresis, mood swings, change in appetite, change in weight, increased thirst Hematologic/Lymphatic:  No anemia, purpura, petechiae. Allergic/Immunologic: no itchy/runny eyes, nasal congestion, recent allergic reactions, rashes  PHYSICAL EXAM: Filed Vitals:   01/02/14 1420  BP: 140/80  Pulse: 101   General: No acute distress Head:  Normocephalic/atraumatic Neck: supple, no paraspinal tenderness, full range of motion Heart:  Regular rate and rhythm Lungs:  Clear to auscultation bilaterally Back: No paraspinal tenderness Skin/Extremities: No rash, no edema Neurological Exam:Mental status: alert and oriented to person, place, and time, no dysarthria or aphasia, Fund of knowledge is appropriate. Recent and remote memory are intact. Attention and concentration are normal. Able to name objects and repeat phrases.  Cranial nerves:  CN I: not tested  CN II: pupils equal, round and reactive to light, no APD. visual fields intact but has some difficulty with inferior field on the right eye (similar to prior). Fundoscopy showed slight blurring of right optic disc, sharp on left. VA 20/400 on the right, 20/40 on left, +red color desaturation CN III, IV, VI: full range of motion (no pain with eye movements), no nystagmus, no ptosis  CN V: facial sensation intact  CN VII: upper and lower face symmetric  CN VIII: hearing intact to finger rub  CN IX, X: gag intact, uvula midline  CN XI: sternocleidomastoid and trapezius  muscles intact  CN XII: tongue midline  Bulk & Tone: normal, no fasciculations.  Motor: 5/5 throughout with no pronator drift.  Sensation: intact to light touch, cold, pin, vibration and joint position sense. No extinction to double simultaneous stimulation. Romberg test negative  Deep Tendon Reflexes: +2 throughout, no ankle clonus  Plantar responses: downgoing bilaterally  Cerebellar: no incoordination on finger to nose, heel to shin. No dysdiadochokinesia  Gait: narrow-based and steady, able to tandem walk adequately.  Tremor: none  IMPRESSION: This is a pleasant 53 yo RH woman with a history of hypertension, hyperlipidemia, insulin resistance on Metformin, with isolated right optic neuritis.  Her MRI brain had shown non-enhancing white  matter changes that are likely due to chronic small vessel disease.  Her CSF was normal, no oligoclonal bands seen.  MRI C-spine and T-spine with and without contrast normal, NMO antibody negative.  We discussed typical prognosis of optic neuritis, vision usually beings to improve over a few weeks and can continue over months, and how IV steroids can hasten clinical improvement, however long-term visual outcome has not been found to be affected by this treatment. With all the unremarkable studies done, we discussed low likelihood for developing MS. She knows to call our office for any change in symptoms at which point repeat imaging will be done.  She will follow-up with ophthalmology and return to clinic in 6 months or earlier if needed.  Thank you for allowing me to participate in her care.  Please do not hesitate to call for any questions or concerns.  The duration of this appointment visit was 25 minutes of face-to-face time with the patient.  Greater than 50% of this time was spent in counseling, explanation of diagnosis, planning of further management, and coordination of care.   Patrcia Dolly, M.D.   CC: Dr. Stephannie Li

## 2014-01-02 NOTE — Patient Instructions (Signed)
1. Continue follow-up with your ophthalmologist 2. Follow-up in 6 months

## 2014-01-06 ENCOUNTER — Encounter: Payer: Self-pay | Admitting: Neurology

## 2014-01-07 LAB — FUNGUS CULTURE W SMEAR: Fungal Smear: NONE SEEN

## 2014-05-26 ENCOUNTER — Ambulatory Visit (INDEPENDENT_AMBULATORY_CARE_PROVIDER_SITE_OTHER): Payer: No Typology Code available for payment source | Admitting: Cardiology

## 2014-05-26 ENCOUNTER — Encounter: Payer: Self-pay | Admitting: Cardiology

## 2014-05-26 VITALS — BP 130/110 | HR 70 | Ht 65.0 in | Wt 180.8 lb

## 2014-05-26 DIAGNOSIS — E7800 Pure hypercholesterolemia, unspecified: Secondary | ICD-10-CM

## 2014-05-26 DIAGNOSIS — I10 Essential (primary) hypertension: Secondary | ICD-10-CM

## 2014-05-26 DIAGNOSIS — H5461 Unqualified visual loss, right eye, normal vision left eye: Secondary | ICD-10-CM

## 2014-05-26 DIAGNOSIS — Z8249 Family history of ischemic heart disease and other diseases of the circulatory system: Secondary | ICD-10-CM

## 2014-05-26 NOTE — Progress Notes (Signed)
[address] [place], [place]  [address] Phone: [phone] Fax:  [phone]  Date:  05/26/2014   ID:  Kathryn [name], DOB 07-19-1960, MRN [id number]  PCP:  Joycelyn Rua, MD   History of Present Illness: Kathryn [name] is a 53 y.o. female with family hx of CAD with mother and brother having myocardial infarction at age 5, and she also has hx of hyperlipidemia, hypertension,and insulin resistance.  08/26/12 she saw Lovenia Kim who added norvasc 5 mg due to hypertension. She states she feels it has not really helped and that for the last few weeks she has been getting blood pressure readings in the 175-212/116-129 range.   On 09/02/12 with BP still elevated her Amlodipine was further increased to 10 mg po qd. Readings since then 160/104, 148/102, 177/105, She is very anxious, has headaches and feels exhausted. She previously was on Losartan HCT 100/25 mg qd and was switched due to her formulary and she is now on Triamterene/HCTZ, without losartan since 2/14.  10/03/12-blood pressure is better but still elevated at times. Fluctuating. I decided to increase her carvedilol to 12.5 mg twice a day. Continue with amlodipine 10 mg a day. Her heart rate should be able to tolerate this. Patient denies chest pain, palpitations, syncope, swelling, SOB, nor PND  05/23/13-unfortunately had labrum tear, biceps tear and currently is in a sling, right-sided. Undergoing physical therapy. No problems with chest pain, shortness of breath, syncope. Her blood pressure is actually under pretty good control.   05/26/14 - right eye vision loss, ischemic optic neuropathy. MRI brain performed. Neurology consultation, 3 separate eye physicians. Blood pressure at today's visit is elevated. At home she states it has been running 137/84. She brought a new blood pressure cuff. She did bring her amlodipine down to 5 mg because of lower extremity edema. Continues with fatigue. Long-term menopause.   Wt  Readings from Last 3 Encounters:  05/26/14 180 lb 12.8 oz (82.01 kg)  01/02/14 173 lb 1.6 oz (78.518 kg)  12/12/13 170 lb (77.111 kg)     Past Medical History  Diagnosis Date  . GERD (gastroesophageal reflux disease)   . Hyperlipidemia   . Hypothyroidism   . Anxiety   . Sleep disturbance   . Hypercholesteremia   . Graves disease     with hyperthyroidism diagnosed at age 47 (treated with propylthiouracil for 2 years)    Past Surgical History  Procedure Laterality Date  . Rt ovary and fallopian tube removed due to cysts    . Carpal tunnel release      Current Outpatient Prescriptions  Medication Sig Dispense Refill  . amLODipine (NORVASC) 10 MG tablet Take 10 mg by mouth daily.    Marland Kitchen aspirin 81 MG tablet Take 81 mg by mouth daily.    . B Complex-C (SUPER B COMPLEX PO) Take 1 tablet by mouth daily.    Marland Kitchen BIOTIN 5000 PO Take by mouth. 5OOO MCG BID    . carvedilol (COREG) 6.25 MG tablet Take 6.25 mg by mouth 2 (two) times daily with a meal.    . cholecalciferol (VITAMIN D) 1000 UNITS tablet Take 2,000 Units by mouth daily.     . cyanocobalamin 1000 MCG tablet Take 100 mcg by mouth daily.    Marland Kitchen desvenlafaxine (PRISTIQ) 50 MG 24 hr tablet Take 50 mg by mouth daily.    Marland Kitchen levothyroxine (SYNTHROID, LEVOTHROID) 75 MCG tablet Take 75 mcg by mouth daily before breakfast.    .  lisdexamfetamine (VYVANSE) 40 MG capsule Take 40 mg by mouth every morning.    Marland Kitchen. losartan (COZAAR) 100 MG tablet Take 100 mg by mouth daily.    Marland Kitchen. lovastatin (MEVACOR) 40 MG tablet Take 40 mg by mouth at bedtime.    . Melatonin 5 MG TABS Take 10 mg by mouth at bedtime as needed (for sleep).     . metFORMIN (GLUCOPHAGE-XR) 750 MG 24 hr tablet Take 750 mg by mouth daily with breakfast.    . pyridOXINE (VITAMIN B-6) 100 MG tablet Take 100 mg by mouth daily.    Marland Kitchen. triamterene-hydrochlorothiazide (DYAZIDE) 37.5-25 MG per capsule Take 1 capsule by mouth daily.    Marland Kitchen. ALPRAZolam (XANAX) 0.5 MG tablet Take 0.5 mg by mouth at  bedtime as needed for anxiety.    Marland Kitchen. BLACK COHOSH PO Take 135 mg by mouth daily.    Marland Kitchen. estrogens, conjugated, (PREMARIN) 0.625 MG tablet Take 0.625 mg by mouth once a week. Take daily for 21 days then do not take for 7 days.    . predniSONE (DELTASONE) 20 MG tablet Take 20 mg by mouth 3 (three) times daily. For 10 days. Started 12/10/13    . traZODone (DESYREL) 100 MG tablet Take 50 mg by mouth as needed for sleep.    . valACYclovir (VALTREX) 500 MG tablet Take 500 mg by mouth daily as needed (for outbreaks).      No current facility-administered medications for this visit.    Allergies:    Allergies  Allergen Reactions  . Tetracyclines & Related Nausea And Vomiting    Social History:  The patient  reports that she has never smoked. She does not have any smokeless tobacco history on file. She reports that she drinks alcohol. She reports that she does not use illicit drugs. is a nonsmoker  ROS:  Please see the history of present illness.   Denies any syncope, bleeding, orthopnea, PND. Occasional ankle edema.    PHYSICAL EXAM: VS:  BP 130/110 mmHg  Pulse 70  Ht 5\' 5"  (1.651 m)  Wt 180 lb 12.8 oz (82.01 kg)  BMI 30.09 kg/m2 Well nourished, well developed, in no acute distress HEENT: normal Neck: no JVD Cardiac:  normal S1, S2; RRR; no murmur Lungs:  clear to auscultation bilaterally, no wheezing, rhonchi or rales Abd: soft, nontender, no hepatomegaly Ext: no edema Skin: warm and dry Neuro: no focal abnormalities noted  EKG:  05/26/14-sinus rhythm, 78, no other abnormalities previously Sinus rhythm, rate 82.  Nuclear stress test-2012-no ischemia, normal EF  ASSESSMENT AND PLAN:  1. Strong family history of CAD-mother and brother MI at age 53. Continue with aggressive primary prevention. Blood pressure control, statin. Exercise. 2. Right eye vision loss-I will check carotid Doppler. She has been diagnosed with anterior optic nerve ischemia. It is essential for her to maintain  excellent blood pressure, cholesterol, weight loss, exercise. Her vision loss has been frustrating for her. She has had Lasix surgery in the past and her right eye was for distance. 3. Hypertension-pulse too low on coreg 12.5. currently well controlled. It is likely that some of her mild ankle edema is secondary from amlodipine side effect. On her own, she decreased her amlodipine to 5 mg. We will continue. Edema is improved. I would like for her to monitor at home her blood pressures on a more regular basis and bring them back with her. Obviously if she has had an ischemic event causing right eye vision loss she needs to maintain excellent  blood pressure. Continue with weight loss. 4. Hyperlipidemia-Kathryn [name] has been checking. On  Lovastatin. 5. Two-month follow-up   Signed, Donato SchultzMark Dannell Gortney, MD [facility] Center[facility]  05/26/2014 2:25 PM

## 2014-05-26 NOTE — Patient Instructions (Addendum)
The current medical regimen is effective;  continue present plan and medications.  Your physician has requested that you have a carotid duplex. This test is an ultrasound of the carotid arteries in your neck. It looks at blood flow through these arteries that supply the brain with blood. Allow one hour for this exam. There are no restrictions or special instructions.  Monitor your blood pressures at home and bring with you to your next office visit.  Follow up in 2 months with Dr Anne FuSkains.  Thank you for choosing [facility]!!

## 2014-06-02 ENCOUNTER — Ambulatory Visit (HOSPITAL_COMMUNITY): Payer: No Typology Code available for payment source | Attending: Cardiology | Admitting: Cardiology

## 2014-06-02 DIAGNOSIS — H539 Unspecified visual disturbance: Secondary | ICD-10-CM

## 2014-06-02 DIAGNOSIS — H538 Other visual disturbances: Secondary | ICD-10-CM | POA: Insufficient documentation

## 2014-06-02 DIAGNOSIS — I10 Essential (primary) hypertension: Secondary | ICD-10-CM | POA: Diagnosis not present

## 2014-06-02 DIAGNOSIS — H5461 Unqualified visual loss, right eye, normal vision left eye: Secondary | ICD-10-CM

## 2014-06-02 DIAGNOSIS — E785 Hyperlipidemia, unspecified: Secondary | ICD-10-CM | POA: Insufficient documentation

## 2014-06-02 DIAGNOSIS — I6523 Occlusion and stenosis of bilateral carotid arteries: Secondary | ICD-10-CM

## 2014-06-02 NOTE — Progress Notes (Signed)
Carotid duplex performed 

## 2014-07-06 ENCOUNTER — Encounter: Payer: Self-pay | Admitting: Neurology

## 2014-07-06 ENCOUNTER — Ambulatory Visit (INDEPENDENT_AMBULATORY_CARE_PROVIDER_SITE_OTHER): Payer: No Typology Code available for payment source | Admitting: Neurology

## 2014-07-06 VITALS — BP 136/88 | HR 76 | Ht 65.0 in | Wt 177.0 lb

## 2014-07-06 DIAGNOSIS — H47011 Ischemic optic neuropathy, right eye: Secondary | ICD-10-CM

## 2014-07-06 NOTE — Patient Instructions (Signed)
1. Continue control of BP, cholesterol, daily aspirin 2. Follow-up in 1 year, call our office for any changes in symptoms

## 2014-07-06 NOTE — Progress Notes (Signed)
NEUROLOGY FOLLOW UP OFFICE NOTE  Kathryn [name] [id number]  HISTORY OF PRESENT ILLNESS: I had the pleasure of seeing Kathryn [name] in follow-up in the neurology clinic on 07/06/2014.  The patient was last seen 7 months ago for vision changes in the right eye concerning for optic neuritis. 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 Other differentials included demyelinating optic neuritis, or infectious, inflammatory, neoplastic, compressive, or other optic neuropathy, which seemed most unlikely in her clinical setting.  She reports that the vision in her right eye is unchanged, vision is covered by a grayish brown film, darker on the top. She occasionally sees a shadow crossing in the right periphery. No further headaches, no dizziness, focal numbness/tingling. She had right shoulder surgery and feels her right hand has not been back to baseline. She had some leg swelling that improved with lower dose Amlodipine, BP has been controlled.  Her MRI brain had shown bilateral non-enhancing small foci of white matter T2/FLAIR signal abnormality, none clearly seen radially along the corpus callosum. There was no clear abnormal signal seen in the right optic nerve. She had an MRI cervical and thoracic spine with and without contrast did not show any demyelinating lesions. She underwent a lumbar puncture which was normal, no oligoclonal bands seen. Her NMO antibody was negative.   HPI:  This is a pleasant 53 yo RH woman with vascular risk factors including hypertension, hyperlipidemia, and "insulin resistance" on Metformin, who presented for evaluation of right optic neuritis. She woke up with a change in her vision on the right eye last 11/17/2013. She denied any eye pain, stating that there was a sheer curtain in her vision that made everything look gray or brown. She could  not make out the details behind the sheer curtain. She had seen retina specialist Dr. [name], diagnosed with optic neuritis OD, with note of visual acuity of 20/800 OD and subtle APD. She returned a week later on 12/03/2013 reporting that the grayish brown spot was bigger that she could see clearly only above the person's hairline. Visual acuity was improved at 20/350-1. She started having bilateral temporal headaches, more on the right side, occurring multiple times a day, lasting up to 2 hours. There is no associated nausea/vomiting, she has some sensitivity to bright lights and loud sounds. She has been taking Ibuprofen 2 tabs twice a day for the past 2 weeks. The left eye is unaffected. She denies any focal numbness/tingling/weakness, but reports that she occasionally has numbness in both feet when her ankles swell up. She does feel the grip on her right hand is weaker, but has attributed this to right rotator cuff surgery last October 2014. She denies any prior history of vision loss. She denies any neck/back pain, dysarthria/dysphagia, negative Lhermitte's sign. No bowel/bladder dysfunction except for urinary urgency. No family history of similar symptoms. IV steroids have been arranged by her ophthalmologist.  She has had hypertension and hyperlipidemia for at least 10 years. She reports that last year her BP was fluctuating and labile, but recently better controlled. She has a history of spinal meningitis in 1992 when she had headaches and neck pain.  PAST MEDICAL HISTORY: Past Medical History  Diagnosis Date  . GERD (gastroesophageal reflux disease)   . Hyperlipidemia   . Hypothyroidism   . Anxiety   . Sleep disturbance   . Hypercholesteremia   . Graves disease  with hyperthyroidism diagnosed at age 53 (treated with propylthiouracil for 2 years)    MEDICATIONS: Current Outpatient Prescriptions on File Prior to Visit  Medication Sig Dispense Refill  . ALPRAZolam (XANAX) 0.5  MG tablet Take 0.5 mg by mouth at bedtime as needed for anxiety.    Marland Kitchen. amLODipine (NORVASC) 5 MG tablet Take 5 mg by mouth daily.    Marland Kitchen. aspirin 81 MG tablet Take 81 mg by mouth daily.    . B Complex-C (SUPER B COMPLEX PO) Take 1 tablet by mouth daily.    Marland Kitchen. BIOTIN 5000 PO Take by mouth. 5OOO MCG BID    . carvedilol (COREG) 6.25 MG tablet Take 6.25 mg by mouth 2 (two) times daily with a meal.    . cholecalciferol (VITAMIN D) 1000 UNITS tablet Take 2,000 Units by mouth daily.     . cyanocobalamin 1000 MCG tablet Take 100 mcg by mouth daily.    Marland Kitchen. desvenlafaxine (PRISTIQ) 50 MG 24 hr tablet Take 50 mg by mouth daily.    Marland Kitchen. levothyroxine (SYNTHROID, LEVOTHROID) 75 MCG tablet Take 75 mcg by mouth daily before breakfast.    . lisdexamfetamine (VYVANSE) 40 MG capsule Take 40 mg by mouth every morning.    Marland Kitchen. losartan (COZAAR) 100 MG tablet Take 100 mg by mouth daily.    Marland Kitchen. lovastatin (MEVACOR) 40 MG tablet Take 40 mg by mouth at bedtime.    . Melatonin 5 MG TABS Take 10 mg by mouth at bedtime as needed (for sleep).     . metFORMIN (GLUCOPHAGE-XR) 750 MG 24 hr tablet Take 750 mg by mouth daily with breakfast.    . pyridOXINE (VITAMIN B-6) 100 MG tablet Take 100 mg by mouth daily.    Marland Kitchen. triamterene-hydrochlorothiazide (DYAZIDE) 37.5-25 MG per capsule Take 1 capsule by mouth daily.    . valACYclovir (VALTREX) 500 MG tablet Take 500 mg by mouth daily as needed (for outbreaks).      No current facility-administered medications on file prior to visit.    ALLERGIES: Allergies  Allergen Reactions  . Tetracyclines & Related Nausea And Vomiting    FAMILY HISTORY: No family history on file.  SOCIAL HISTORY: History   Social History  . Marital Status: Married    Spouse Name: N/A    Number of Children: N/A  . Years of Education: N/A   Occupational History  . Not on file.   Social History Main Topics  . Smoking status: Never Smoker   . Smokeless tobacco: Not on file  . Alcohol Use: Yes  . Drug Use:  No  . Sexual Activity: Not on file   Other Topics Concern  . Not on file   Social History Narrative    REVIEW OF SYSTEMS: Constitutional: No fevers, chills, or sweats, no generalized fatigue, change in appetite Eyes: No visual changes, double vision, eye pain Ear, nose and throat: No hearing loss, ear pain, nasal congestion, sore throat Cardiovascular: No chest pain, palpitations Respiratory:  No shortness of breath at rest or with exertion, wheezes GastrointestinaI: No nausea, vomiting, diarrhea, abdominal pain, fecal incontinence Genitourinary:  No dysuria, urinary retention or frequency Musculoskeletal:  No neck pain, back pain Integumentary: No rash, pruritus, skin lesions Neurological: as above Psychiatric: No depression, insomnia, anxiety Endocrine: No palpitations, fatigue, diaphoresis, mood swings, change in appetite, change in weight, increased thirst Hematologic/Lymphatic:  No anemia, purpura, petechiae. Allergic/Immunologic: no itchy/runny eyes, nasal congestion, recent allergic reactions, rashes  PHYSICAL EXAM: Filed Vitals:   07/06/14 1305  BP:  136/88  Pulse: 76   General: No acute distress Head:  Normocephalic/atraumatic Neck: supple, no paraspinal tenderness, full range of motion Heart:  Regular rate and rhythm Lungs:  Clear to auscultation bilaterally Back: No paraspinal tenderness Skin/Extremities: No rash, no edema Neurological Exam: alert and oriented to person, place, and time, no dysarthria or aphasia, Fund of knowledge is appropriate. Recent and remote memory are intact. Attention and concentration are normal. Able to name objects and repeat phrases.  Cranial nerves:  CN I: not tested  CN II: pupils equal, round and reactive to light, no APD. visual fields intact but again has some difficulty with inferior field on the right eye (similar to prior). Fundoscopy showed slight blurring of right optic disc, sharp on left. CN III, IV, VI: full range of  motion (no pain with eye movements), no nystagmus, no ptosis  CN V: facial sensation intact  CN VII: upper and lower face symmetric  CN VIII: hearing intact to finger rub  CN IX, X: gag intact, uvula midline  CN XI: sternocleidomastoid and trapezius muscles intact  CN XII: tongue midline  Bulk & Tone: normal, no fasciculations.  Motor: 5/5 throughout with no pronator drift.  Sensation: intact to light touch. No extinction to double simultaneous stimulation. Romberg test negative  Deep Tendon Reflexes: +2 throughout, no ankle clonus  Plantar responses: downgoing bilaterally  Cerebellar: no incoordination on finger to nose, heel to shin. No dysdiadochokinesia  Gait: narrow-based and steady, able to tandem walk adequately.  Tremor: none  IMPRESSION: This is a pleasant 53 yo RH woman with a history of hypertension, hyperlipidemia, insulin resistance on Metformin, who presented with vision loss in the right eye diagnosed as optic neuritis. Her MRI brain had shown non-enhancing white matter changes that are likely due to chronic small vessel disease. Her CSF was normal, no oligoclonal bands seen. MRI C-spine and T-spine with and without contrast normal, NMO antibody negative. She had a second opinion with neuro-ophthalmology at [facility] and has been diagnosed with non-arteritic ischemic optic neuropathy. We again discussed that the MRI findings are most likely due to chronic microvascular change, no evidence of demyelinating disease. We discussed control of vascular risk factors and daily aspirin. She will follow-up in 1 year and knows to call our office for any change in symptoms.  Thank you for allowing me to participate in her care.  Please do not hesitate to call for any questions or concerns.  The duration of this appointment visit was 15 minutes of face-to-face time with the patient.  Greater than 50% of this time was spent in counseling, explanation of diagnosis, planning of  further management, and coordination of care.   Patrcia Dolly, M.D.   CC: Dr. Lenise Arena

## 2014-07-07 ENCOUNTER — Other Ambulatory Visit: Payer: Self-pay | Admitting: Obstetrics and Gynecology

## 2014-07-07 DIAGNOSIS — N63 Unspecified lump in unspecified breast: Secondary | ICD-10-CM

## 2014-07-08 ENCOUNTER — Encounter: Payer: Self-pay | Admitting: Neurology

## 2014-07-21 ENCOUNTER — Ambulatory Visit: Payer: No Typology Code available for payment source | Admitting: Cardiology

## 2014-08-03 ENCOUNTER — Ambulatory Visit
Admission: RE | Admit: 2014-08-03 | Discharge: 2014-08-03 | Disposition: A | Discharge status: Home / Self Care | Payer: No Typology Code available for payment source | Source: Ambulatory Visit | Attending: Obstetrics and Gynecology | Admitting: Obstetrics and Gynecology

## 2014-08-03 DIAGNOSIS — N63 Unspecified lump in unspecified breast: Secondary | ICD-10-CM

## 2014-08-12 ENCOUNTER — Other Ambulatory Visit: Payer: Self-pay | Admitting: *Deleted

## 2014-08-12 ENCOUNTER — Ambulatory Visit (INDEPENDENT_AMBULATORY_CARE_PROVIDER_SITE_OTHER): Payer: No Typology Code available for payment source | Admitting: Cardiology

## 2014-08-12 ENCOUNTER — Encounter: Payer: Self-pay | Admitting: Cardiology

## 2014-08-12 VITALS — BP 142/90 | HR 87 | Ht 65.0 in | Wt 176.0 lb

## 2014-08-12 DIAGNOSIS — H5461 Unqualified visual loss, right eye, normal vision left eye: Secondary | ICD-10-CM

## 2014-08-12 MED ORDER — NEBIVOLOL HCL 5 MG PO TABS: 5.0000 mg | ORAL_TABLET | Freq: Every day | ORAL | Status: DC

## 2014-08-12 NOTE — Progress Notes (Signed)
[address] [place], [place]  [address] Phone: [phone] Fax:  [phone]  Date:  08/12/2014   ID:  Kathryn [name], DOB Jan 18, 1961, MRN [id number]  PCP:  Kathryn Rua, MD   History of Present Illness: Kathryn [name] is a 54 y.o. female with family hx of CAD with mother and brother having myocardial infarction at age 50, and she also has hx of hyperlipidemia, hypertension,and insulin resistance.  08/26/12 she saw Lovenia Kim who added norvasc 5 mg due to hypertension. She states she feels it has not really helped and that for the last few weeks she has been getting blood pressure readings in the 175-212/116-129 range.   On 09/02/12 with BP still elevated her Amlodipine was further increased to 10 mg po qd. Readings since then 160/104, 148/102, 177/105, She is very anxious, has headaches and feels exhausted. She previously was on Losartan HCT 100/25 mg qd and was switched due to her formulary and she is now on Triamterene/HCTZ, without losartan since 2/14.  10/03/12-blood pressure is better but still elevated at times. Fluctuating. I decided to increase her carvedilol to 12.5 mg twice a day. Continue with amlodipine 10 mg a day. Her heart rate should be able to tolerate this. Patient denies chest pain, palpitations, syncope, swelling, SOB, nor PND  05/23/13-unfortunately had labrum tear, biceps tear and currently is in a sling, right-sided. Undergoing physical therapy. No problems with chest pain, shortness of breath, syncope. Her blood pressure is actually under pretty good control.   05/26/14 - right eye vision loss, ischemic optic neuropathy. MRI brain performed. Neurology consultation, 3 separate eye physicians. Blood pressure at today's visit is elevated. At home she states it has been running 137/84. She brought a new blood pressure cuff. She did bring her amlodipine down to 5 mg because of lower extremity edema. Continues with fatigue. Long-term  menopause.  08/12/14 - BPs sometimes >90 diastolic. Still not optimal control. Starting Bystolic 5 mg. We are going to stop carvedilol 6.25 mg twice a day.  Wt Readings from Last 3 Encounters:  08/12/14 176 lb (79.833 kg)  07/06/14 177 lb (80.287 kg)  05/26/14 180 lb 12.8 oz (82.01 kg)     Past Medical History  Diagnosis Date  . GERD (gastroesophageal reflux disease)   . Hyperlipidemia   . Hypothyroidism   . Anxiety   . Sleep disturbance   . Hypercholesteremia   . Graves disease     with hyperthyroidism diagnosed at age 67 (treated with propylthiouracil for 2 years)    Past Surgical History  Procedure Laterality Date  . Rt ovary and fallopian tube removed due to cysts    . Carpal tunnel release      Current Outpatient Prescriptions  Medication Sig Dispense Refill  . ALPRAZolam (XANAX) 0.5 MG tablet Take 0.5 mg by mouth at bedtime as needed for anxiety.    Marland Kitchen amLODipine (NORVASC) 5 MG tablet Take 5 mg by mouth daily.    Marland Kitchen aspirin 81 MG tablet Take 81 mg by mouth daily.    . B Complex-C (SUPER B COMPLEX PO) Take 1 tablet by mouth daily.    Marland Kitchen BIOTIN 5000 PO Take by mouth. 5OOO MCG BID    . carvedilol (COREG) 6.25 MG tablet Take 6.25 mg by mouth 2 (two) times daily with a meal.    . cholecalciferol (VITAMIN D) 1000 UNITS tablet Take 2,000 Units by mouth daily.     . cyanocobalamin 1000  MCG tablet Take 100 mcg by mouth daily.    Marland Kitchen. desvenlafaxine (PRISTIQ) 50 MG 24 hr tablet Take 50 mg by mouth daily.    Marland Kitchen. levothyroxine (SYNTHROID, LEVOTHROID) 75 MCG tablet Take 75 mcg by mouth daily before breakfast.    . lisdexamfetamine (VYVANSE) 40 MG capsule Take 40 mg by mouth every morning.    Marland Kitchen. losartan (COZAAR) 100 MG tablet Take 100 mg by mouth daily.    Marland Kitchen. lovastatin (MEVACOR) 40 MG tablet Take 40 mg by mouth at bedtime.    . Melatonin 5 MG TABS Take 10 mg by mouth at bedtime as needed (for sleep).     . metFORMIN (GLUCOPHAGE-XR) 750 MG 24 hr tablet Take 750 mg by mouth daily with  breakfast.    . pyridOXINE (VITAMIN B-6) 100 MG tablet Take 100 mg by mouth daily.    Marland Kitchen. triamterene-hydrochlorothiazide (DYAZIDE) 37.5-25 MG per capsule Take 1 capsule by mouth daily.    . valACYclovir (VALTREX) 500 MG tablet Take 500 mg by mouth daily as needed (for outbreaks).      No current facility-administered medications for this visit.    Allergies:    Allergies  Allergen Reactions  . Tetracyclines & Related Nausea And Vomiting    Social History:  The patient  reports that she has never smoked. She does not have any smokeless tobacco history on file. She reports that she drinks alcohol. She reports that she does not use illicit drugs. is a nonsmoker  ROS:  Please see the history of present illness.   Denies any syncope, bleeding, orthopnea, PND. Prior ankle edema on amlodipine.    PHYSICAL EXAM: VS:  BP 142/90 mmHg  Pulse 87  Ht 5\' 5"  (1.651 m)  Wt 176 lb (79.833 kg)  BMI 29.29 kg/m2 Well nourished, well developed, in no acute distress HEENT: normal Neck: no JVD Cardiac:  normal S1, S2; RRR; no murmur Lungs:  clear to auscultation bilaterally, no wheezing, rhonchi or rales Abd: soft, nontender, no hepatomegaly Ext: no edema Skin: warm and dry Neuro: no focal abnormalities noted  EKG:  05/26/14-sinus rhythm, 78, no other abnormalities previously Sinus rhythm, rate 82.  Nuclear stress test-2012-no ischemia, normal EF  ASSESSMENT AND PLAN:  1. Strong family history of CAD-mother and brother MI at age 54. Continue with aggressive primary prevention. Blood pressure control, statin. Exercise. 2. Right eye vision loss-carotid Doppler with mild bilateral plaque. Recommendation was to repeat in 2 years. This study was performed on 06/02/14. She has been diagnosed with anterior optic nerve ischemia. It is essential for her to maintain excellent blood pressure, cholesterol, weight loss, exercise. Her vision loss has been frustrating for her. She has had Lasix surgery in the past  and her right eye was for distance. 3. Hypertension-pulse too low on coreg 12.5.  It is likely that some of her mild ankle edema is secondary from amlodipine side effect. On her own, she decreased her amlodipine to 5 mg. We will continue. Edema is improved. I will try Bysotlic 5 QD. Stop coreg 6.25 BID. Obviously if she has had an ischemic event causing right eye vision loss she needs to maintain excellent blood pressure. Continue with weight loss. 4. Hyperlipidemia-Yuleni Burich Hepler has been checking. On  Lovastatin. 5. Two-month follow-up. She will call me with blood pressure readings   Signed, Donato SchultzMark Phinneas Shakoor, MD [facility]  08/12/2014 2:58 PM

## 2014-08-12 NOTE — Patient Instructions (Signed)
Your physician has recommended you make the following change in your medication:  1) STOP Coreg 2) START Bystolic 5 mg daily  Your physician recommends that you schedule a follow-up appointment in: 2 months with Dr. Anne FuSkains.  Please check your blood pressure daily for about 10 days and call with results.

## 2014-08-18 ENCOUNTER — Other Ambulatory Visit: Payer: Self-pay | Admitting: Obstetrics and Gynecology

## 2014-08-19 LAB — CYTOLOGY - PAP

## 2014-08-28 ENCOUNTER — Telehealth: Payer: Self-pay | Admitting: Cardiology

## 2014-08-28 DIAGNOSIS — I10 Essential (primary) hypertension: Secondary | ICD-10-CM

## 2014-08-28 NOTE — Telephone Encounter (Signed)
Spoke with patient who states she takes Losartan 100 mg once daily - states in 2014 there was a shortage of the Losartan with diuretic that she took, so Dr. Anne FuSkains changed her medications to Losartan 100 mg and the triamterene/hctz.  I reviewed Kathryn [name]'s advice with patient who verbalized understanding and agreement to increase dose of triamterene/HCTZ to 2 tabs for total of 75/50 mg daily and to d/c Bystolic.  Patient states her visual symptoms are not more pronounced than before, states symptoms since starting Bystolic are more related to fatigue, nausea, headache.  I scheduled patient for lab appointment for f/u BMET Friday 2/19.  I advised patient to call back with questions or concerns; she verbalized understanding and agreement with plan of care.

## 2014-08-28 NOTE — Telephone Encounter (Signed)
New Message     Pt c/o medication issue:  1. Name of Medication: Bystolic  2. How are you currently taking this medication (dosage and times per day)?5mg  daily   3. Are you having a reaction (difficulty breathing--STAT)? Terrible headaches that won't stop and nausea  4. What is your medication issue? For high blood pressure.

## 2014-08-28 NOTE — Telephone Encounter (Signed)
Patient calling to report recent BP/pulse readings.  On 08/12/14, Dr. Anne FuSkains had patient d/c Coreg and start Bystolic 5 mg  for better pulse control as patient was having some bradycardia and for better blood pressure control.   121/80, P 58 143/93, P 63 118/84, P 64 142/90, P 61 123/83, P 57 133/85, P 68 122/79, P 59 138/89, P 58 123/80, P 58 130/88, P 64 126/79, P 62 Today: 120/77, P 53 Patient c/o mild to medium nausea, states it is constant.  States she has headaches, feels lethargic and has noticed increased visuall problems, difficulty focusing; symptoms started 2 days after starting Bystolic Patient states today, changed from Pristiq to Venlafaxine HCL XR 75 mg due to insurance change - states this is just to note as her symptoms started well before today.  I advised patient that Dr. Anne FuSkains is out of the office today and Monday.  I advised that I will discuss with Ronie Spiesayna Dunn, PA-C who is APP flex in the office today and call her back.  Patient verbalized understanding and agreement.

## 2014-08-28 NOTE — Telephone Encounter (Signed)
Please have patient stop Bystolic. Per Dr. Anne FuSkains' note he recently changed beta blockers due to bradycardia. I called Dr. Lenise ArenaMeyers office to obtain most recent lytes/renal function. 06/2014: glu 116, BUN 17, Cr 0.89, Na 141, K 4.4, Cl 104, CO2 29, calcium 9.9.  Please increase triamterene/HCTZ 37.5/25mg  to 75/50mg  daily. Symptoms listed are consistent with what is previously noted in her chart. The headache/nausea could be due to the Bystolic. Visual changes is not usually associated with this medication especially in the presence of normal BP - this does not seem to be related to her current cardiovascular status so if there is any acute worsening of her symptoms she should go for evaluation to urgent care or ER. Otherwise, if this is similar to how she feels on occasion, will observe off beta blocker and see how she does.  Repeat BMET in 1 week.   Also please clarify if patient is still on Losartan. Her chart says she is but in the text of Dr. Anne FuSkains' note he mentions she is off it since 2014. Thank you!   Dayna Dunn PA-C

## 2014-08-31 NOTE — Telephone Encounter (Signed)
Agree with plan.  Celica Kotowski, MD  

## 2014-09-04 ENCOUNTER — Telehealth: Payer: Self-pay | Admitting: *Deleted

## 2014-09-04 ENCOUNTER — Other Ambulatory Visit (INDEPENDENT_AMBULATORY_CARE_PROVIDER_SITE_OTHER): Payer: No Typology Code available for payment source | Admitting: *Deleted

## 2014-09-04 DIAGNOSIS — I10 Essential (primary) hypertension: Secondary | ICD-10-CM

## 2014-09-04 LAB — BASIC METABOLIC PANEL
BUN: 18 mg/dL (ref 6–23)
CALCIUM: 9.8 mg/dL (ref 8.4–10.5)
CO2: 31 meq/L (ref 19–32)
Chloride: 97 mEq/L (ref 96–112)
Creatinine, Ser: 0.89 mg/dL (ref 0.40–1.20)
GFR: 70.37 mL/min (ref >60.00)
Glucose, Bld: 84 mg/dL (ref 70–99)
POTASSIUM: 3.5 meq/L (ref 3.5–5.1)
SODIUM: 137 meq/L (ref 135–145)

## 2014-09-04 NOTE — Telephone Encounter (Signed)
Spoke with pt and made her aware of lab results and recommendations. Scheduled appt for 09/18/14 for labs. Asked pt about BPs and she gave me readings for this week as follows:  Sat  BP 124/83, P 69 Sun  BP 128/89, P 71 Mon BP 128/87, P 71 Wed BP 133/93, P 69 Thurs BP 143/90, P 83 Fri BP 124/89, P 86  Pt also wanted to inform our office of a medication change as well.  Pt states that insurance will no longer cover Pristiq so she is now taking Effexor 75mg  daily. Will forward information to Ronie Spiesayna Dunn for review.

## 2014-09-04 NOTE — Telephone Encounter (Signed)
-----   Message from Laurann Montanaayna N Dunn, [place]PA-C sent at 09/04/2014  5:15 PM EST ----- Please find out how patient's BP is doing. BMET is stable. Potassium is normal but on the low end - please increase dietary sources of potassium (potatoes, squash, yogurt, fish, avocados, mushrooms, and bananas - one good way is to incorporate one banana a day). Recheck BMET in 2 weeks. Dayna Dunn PA-C

## 2014-09-07 NOTE — Telephone Encounter (Signed)
pt notified Ronie Spiesayna Dunn, PA reviewed BP readings from Friday 2/19, with her recommendations to continue exercise as well as to follow a low salt diet (ie) DASH diet. Pt verbalized plan of care. Confirmed lab and Dr. Anne FuSkains appt.

## 2014-09-07 NOTE — Telephone Encounter (Signed)
Diastolics still running a little high but in general BPs are better. Would also advise she work on lifestyle modification with regular physical exercise and DASH-type diet (lower salt). Keep f/u as planned with Dr. Anne FuSkains. Dayna Dunn PA-C

## 2014-09-18 ENCOUNTER — Other Ambulatory Visit: Payer: Self-pay

## 2014-09-18 ENCOUNTER — Other Ambulatory Visit (INDEPENDENT_AMBULATORY_CARE_PROVIDER_SITE_OTHER): Payer: No Typology Code available for payment source | Admitting: *Deleted

## 2014-09-18 DIAGNOSIS — I10 Essential (primary) hypertension: Secondary | ICD-10-CM

## 2014-09-18 LAB — BASIC METABOLIC PANEL
BUN: 19 mg/dL (ref 6–23)
CALCIUM: 9.4 mg/dL (ref 8.4–10.5)
CHLORIDE: 99 meq/L (ref 96–112)
CO2: 27 meq/L (ref 19–32)
Creatinine, Ser: 0.98 mg/dL (ref 0.40–1.20)
GFR: 62.96 mL/min (ref >60.00)
GLUCOSE: 156 mg/dL — AB (ref 70–99)
Potassium: 3.3 mEq/L — ABNORMAL LOW (ref 3.5–5.1)
Sodium: 136 mEq/L (ref 135–145)

## 2014-09-18 NOTE — Telephone Encounter (Signed)
Patient informed of results and verbal understanding expressed.   Instructed patient to START KCl 40 meq daily. Repeat BMET scheduled for next Friday.   Patient also gave BP log: 3/4: BP 126/90 (now)        BP 135/93 HR 101 (earlier today)  3/3: BP 139/98 HR 104 3/2: BP 145/96 HR 88 3/1: BP 150/98 HR 88 2/29: BP 137/93 HR 93 2/28: BP 139/92 HR 75 2/27: BP 138/96 HR 103 2/26: BP 139/97 HR 78 2/25: BP 139/94 HR 98  To Dr. Anne FuSkains for review.

## 2014-09-18 NOTE — Addendum Note (Signed)
Addended by: Tonita PhoenixBOWDEN, ROBIN K on: 09/18/2014 01:56 PM   Modules accepted: Orders

## 2014-09-18 NOTE — Telephone Encounter (Signed)
-----   Message from Laurann Montanaayna N Dunn, PA-C sent at 09/18/2014  4:48 PM EST ----- Please start KCl 40meq daily. Recheck BMET 1 week. Dayna Dunn PA-C

## 2014-09-21 ENCOUNTER — Other Ambulatory Visit: Payer: Self-pay

## 2014-09-21 ENCOUNTER — Telehealth: Payer: Self-pay

## 2014-09-21 MED ORDER — POTASSIUM CHLORIDE ER 20 MEQ PO TBCR: 40.0000 meq | EXTENDED_RELEASE_TABLET | Freq: Every day | ORAL | Status: DC

## 2014-09-21 NOTE — Telephone Encounter (Signed)
Per result note 09/18/14, verified with patient to start potassium 40 meq daily.  Med list updated.

## 2014-09-25 ENCOUNTER — Other Ambulatory Visit: Payer: No Typology Code available for payment source

## 2014-09-29 ENCOUNTER — Other Ambulatory Visit (INDEPENDENT_AMBULATORY_CARE_PROVIDER_SITE_OTHER): Payer: No Typology Code available for payment source | Admitting: *Deleted

## 2014-09-29 DIAGNOSIS — I10 Essential (primary) hypertension: Secondary | ICD-10-CM

## 2014-09-29 LAB — BASIC METABOLIC PANEL
BUN: 18 mg/dL (ref 6–23)
CALCIUM: 9.7 mg/dL (ref 8.4–10.5)
CO2: 29 meq/L (ref 19–32)
CREATININE: 0.93 mg/dL (ref 0.40–1.20)
Chloride: 101 mEq/L (ref 96–112)
GFR: 66.87 mL/min (ref >60.00)
GLUCOSE: 166 mg/dL — AB (ref 70–99)
Potassium: 3.7 mEq/L (ref 3.5–5.1)
SODIUM: 136 meq/L (ref 135–145)

## 2014-10-05 ENCOUNTER — Telehealth: Payer: Self-pay | Admitting: *Deleted

## 2014-10-05 NOTE — Telephone Encounter (Signed)
pt notified of lab results and to continue on the K+ supp. Pt advised to f/u w/PCP about elevated glucose 166. pt said ok and thank you.

## 2014-10-14 ENCOUNTER — Encounter: Payer: Self-pay | Admitting: Cardiology

## 2014-10-14 ENCOUNTER — Ambulatory Visit (INDEPENDENT_AMBULATORY_CARE_PROVIDER_SITE_OTHER): Payer: No Typology Code available for payment source | Admitting: Cardiology

## 2014-10-14 VITALS — BP 136/98 | HR 98 | Ht 65.0 in | Wt 179.0 lb

## 2014-10-14 DIAGNOSIS — I10 Essential (primary) hypertension: Secondary | ICD-10-CM | POA: Diagnosis not present

## 2014-10-14 DIAGNOSIS — F419 Anxiety disorder, unspecified: Secondary | ICD-10-CM | POA: Diagnosis not present

## 2014-10-14 DIAGNOSIS — E7800 Pure hypercholesterolemia, unspecified: Secondary | ICD-10-CM

## 2014-10-14 DIAGNOSIS — H5461 Unqualified visual loss, right eye, normal vision left eye: Secondary | ICD-10-CM | POA: Diagnosis not present

## 2014-10-14 MED ORDER — TRIAMTERENE-HCTZ 37.5-25 MG PO CAPS: 2.0000 | ORAL_CAPSULE | Freq: Every day | ORAL | Status: DC

## 2014-10-14 MED ORDER — AMLODIPINE BESYLATE 5 MG PO TABS: 5.0000 mg | ORAL_TABLET | Freq: Every day | ORAL | Status: DC

## 2014-10-14 MED ORDER — HYDRALAZINE HCL 25 MG PO TABS: 25.0000 mg | ORAL_TABLET | Freq: Two times a day (BID) | ORAL | Status: DC

## 2014-10-14 NOTE — Patient Instructions (Signed)
Your physician has recommended you make the following change in your medication:  1) START taking Hydralazine 25mg  twice daily  Your physician recommends that you schedule a follow-up appointment in: 4 weeks with Dr. Anne FuSkains.

## 2014-10-14 NOTE — Progress Notes (Signed)
[address] [place], [place]  [address] Phone: [phone]([phone] Fax:  [phone]  Date:  10/14/2014   ID:  Kathryn [name], DOB 04/01/1961, MRN [id number]  PCP:  Lenora BoysFRIED, ROBERT L, MD   History of Present Illness: Kathryn [name] is a 54 y.o. female with family hx of CAD with mother and brother having myocardial infarction at age 54, and she also has hx of hyperlipidemia, hypertension,and insulin resistance.  08/26/12 she saw Lovenia KimMark Hepler who added norvasc 5 mg due to hypertension. She states she feels it has not really helped and that for the last few weeks she has been getting blood pressure readings in the 175-212/116-129 range.   On 09/02/12 with BP still elevated her Amlodipine was further increased to 10 mg po qd. Readings since then 160/104, 148/102, 177/105, She is very anxious, has headaches and feels exhausted. She previously was on Losartan HCT 100/25 mg qd and was switched due to her formulary and she is now on Triamterene/HCTZ, without losartan since 2/14.  10/03/12-blood pressure is better but still elevated at times. Fluctuating. I decided to increase her carvedilol to 12.5 mg twice a day. Continue with amlodipine 10 mg a day. Her heart rate should be able to tolerate this. Patient denies chest pain, palpitations, syncope, swelling, SOB, nor PND  05/23/13-unfortunately had labrum tear, biceps tear and currently is in a sling, right-sided. Undergoing physical therapy. No problems with chest pain, shortness of breath, syncope. Her blood pressure is actually under pretty good control.   05/26/14 - right eye vision loss, ischemic optic neuropathy. MRI brain performed. Neurology consultation, 3 separate eye physicians. Blood pressure at today's visit is elevated. At home she states it has been running 137/84. She brought a new blood pressure cuff. She did bring her amlodipine down to 5 mg because of lower extremity edema. Continues with fatigue. Long-term  menopause.  08/12/14 - BPs sometimes >90 diastolic. Still not optimal control. Starting Bystolic 5 mg. We are going to stop carvedilol 6.25 mg twice a day.  10/14/14 - Bystolic did not tolerate. Doubled triamterine/HCT. Does not want to try beta blocker again. Depression. Cost of meds. No CP. SOB with stairs.   Wt Readings from Last 3 Encounters:  10/14/14 179 lb (81.194 kg)  08/12/14 176 lb (79.833 kg)  07/06/14 177 lb (80.287 kg)     Past Medical History  Diagnosis Date  . GERD (gastroesophageal reflux disease)   . Hyperlipidemia   . Hypothyroidism   . Anxiety   . Sleep disturbance   . Hypercholesteremia   . Graves disease     with hyperthyroidism diagnosed at age 54 (treated with propylthiouracil for 2 years)    Past Surgical History  Procedure Laterality Date  . Rt ovary and fallopian tube removed due to cysts    . Carpal tunnel release      Current Outpatient Prescriptions  Medication Sig Dispense Refill  . ALPRAZolam (XANAX) 0.5 MG tablet Take 0.5 mg by mouth at bedtime as needed for anxiety.    Marland Kitchen. amLODipine (NORVASC) 5 MG tablet Take 5 mg by mouth daily.    Marland Kitchen. aspirin 81 MG tablet Take 81 mg by mouth daily.    . B Complex-C (SUPER B COMPLEX PO) Take 1 tablet by mouth daily.    Marland Kitchen. BIOTIN 5000 PO Take by mouth. 5OOO MCG BID    . cholecalciferol (VITAMIN D) 1000 UNITS tablet Take 2,000 Units by mouth daily.     .Marland Kitchen  cyanocobalamin 1000 MCG tablet Take 100 mcg by mouth daily.    Marland Kitchen levothyroxine (SYNTHROID, LEVOTHROID) 88 MCG tablet Take 88 mcg by mouth daily before breakfast.    . lisdexamfetamine (VYVANSE) 40 MG capsule Take 40 mg by mouth every morning.    Marland Kitchen losartan (COZAAR) 100 MG tablet Take 100 mg by mouth daily.    Marland Kitchen lovastatin (MEVACOR) 40 MG tablet Take 40 mg by mouth at bedtime.    . Melatonin 5 MG TABS Take 10 mg by mouth at bedtime as needed (for sleep).     . metFORMIN (GLUCOPHAGE) 500 MG tablet Take 500 mg by mouth 2 (two) times daily with a meal.    . potassium  chloride 20 MEQ TBCR Take 40 mEq by mouth daily. 60 tablet 3  . pyridOXINE (VITAMIN B-6) 100 MG tablet Take 100 mg by mouth daily.    Marland Kitchen triamterene-hydrochlorothiazide (DYAZIDE) 37.5-25 MG per capsule Take 2 capsules by mouth daily.    . valACYclovir (VALTREX) 500 MG tablet Take 500 mg by mouth daily as needed (for outbreaks).     . venlafaxine XR (EFFEXOR-XR) 75 MG 24 hr capsule Take 75 mg by mouth daily with breakfast.     No current facility-administered medications for this visit.    Allergies:    Allergies  Allergen Reactions  . Flagyl [Metronidazole] Nausea Only    Nausea and Headache  . Tetracyclines & Related Nausea And Vomiting    Social History:  The patient  reports that she has never smoked. She does not have any smokeless tobacco history on file. She reports that she drinks alcohol. She reports that she does not use illicit drugs. is a nonsmoker  ROS:  Please see the history of present illness.   Excessive sweating, fatigue, depression, anxiety, headache Denies any syncope, bleeding, orthopnea, PND. Prior ankle edema on amlodipine.    PHYSICAL EXAM: VS:  BP 136/98 mmHg  Pulse 98  Ht  (1.651 m)  Wt 179 lb (81.194 kg)  BMI 29.79 kg/m2 Well nourished, well developed, in no acute distress HEENT: normal Neck: no JVD Cardiac:  normal S1, S2; RRR; no murmur Lungs:  clear to auscultation bilaterally, no wheezing, rhonchi or rales Abd: soft, nontender, no hepatomegaly Ext: no edema Skin: warm and dry Neuro: no focal abnormalities noted  EKG:  05/26/14-sinus rhythm, 78, no other abnormalities previously Sinus rhythm, rate 82.   Nuclear stress test-2012-no ischemia, normal EF  ASSESSMENT AND PLAN:  1. Strong family history of CAD-mother and brother MI at age 57. Continue with aggressive primary prevention. Blood pressure control, statin. Exercise. 2. Right eye vision loss-carotid Doppler with mild bilateral plaque. Recommendation was to repeat in 2 years. This study  was performed on 06/02/14. She has been diagnosed with anterior optic nerve ischemia. It is essential for her to maintain excellent blood pressure, cholesterol, weight loss, exercise. Her vision loss has been frustrating for her. She has had Lasik surgery in the past and her right eye was for distance. 3. Hypertension-pulse too low on coreg 12.5.  It is likely that some of her mild ankle edema is secondary from amlodipine side effect. On her own, she decreased her amlodipine to 5 mg. We will continue. Edema is improved. Tried Bysotlic 5 QD but did not tolerate and would cost her $150 a month. We will start hydralazine 25 mg twice a day. I think clonidine cause excessive fatigue. She does not wish to go back to a beta blocker because of fatigue.  I also encouraged walking, continued weight loss. Obviously if she has had an ischemic event causing right eye vision loss she needs to maintain excellent blood pressure. Continue with weight loss. 4. Hyperlipidemia-Mark Hepler has been checking. On  Lovastatin. 5. One-month follow-up. Continue to take blood pressure readings. 6. Depression - continues to struggle with this. Encourage exercise. Recent change to Effexor. Cost of her other antidepressant was an issue.    Signed, Donato Schultz, MD [facility]  10/14/2014 11:20 AM

## 2014-10-26 ENCOUNTER — Telehealth: Payer: Self-pay | Admitting: *Deleted

## 2014-10-26 NOTE — Telephone Encounter (Signed)
Pt requesting increase of TRIAM/HCTZ 37.5-25mg  to 75-50mg  tablets sent to [facility] [address]. Will forward to Dr. Anne FuSkains. Medication was filled on 10/15/14 #180 with 3 refills

## 2014-11-19 ENCOUNTER — Ambulatory Visit (INDEPENDENT_AMBULATORY_CARE_PROVIDER_SITE_OTHER): Payer: No Typology Code available for payment source | Admitting: Cardiology

## 2014-11-19 ENCOUNTER — Encounter: Payer: Self-pay | Admitting: Cardiology

## 2014-11-19 VITALS — BP 130/88 | HR 104 | Ht 65.0 in | Wt 180.8 lb

## 2014-11-19 DIAGNOSIS — I10 Essential (primary) hypertension: Secondary | ICD-10-CM

## 2014-11-19 MED ORDER — TRIAMTERENE-HCTZ 75-50 MG PO TABS: 1.0000 | ORAL_TABLET | Freq: Every day | ORAL | Status: DC

## 2014-11-19 NOTE — Patient Instructions (Signed)
Medication Instructions:  Please increase your Triam/HCTZ to 75-50 mg a day. Continue all other medications as listed  Labwork: None  Testing/Procedures: None  Follow-Up: Follow up in 6 months with Dr. Anne FuSkains.  You will receive a letter in the mail 2 months before you are due.  Please call us when you receive this letter to schedule your follow up appointment.  Thank you for choosing [facility]!!

## 2014-11-19 NOTE — Progress Notes (Signed)
[address] [place], [place]  [address] Phone: [phone](336) [phone] Fax:  [phone]  Date:  11/19/2014   ID:  Kathryn [name], DOB 09/04/1960, MRN [id number]  PCP:  Lenora BoysFRIED, ROBERT L, MD   History of Present Illness: Kathryn [name] is a 54 y.o. female with family hx of CAD with mother and brother having myocardial infarction at age 54, and she also has hx of hyperlipidemia, hypertension,and insulin resistance.  08/26/12 she saw Lovenia KimMark Hepler who added norvasc 5 mg due to hypertension. She states she feels it has not really helped and that for the last few weeks she has been getting blood pressure readings in the 175-212/116-129 range.   On 09/02/12 with BP still elevated her Amlodipine was further increased to 10 mg po qd. Readings since then 160/104, 148/102, 177/105, She is very anxious, has headaches and feels exhausted. She previously was on Losartan HCT 100/25 mg qd and was switched due to her formulary and she is now on Triamterene/HCTZ, without losartan since 2/14.  10/03/12-blood pressure is better but still elevated at times. Fluctuating. I decided to increase her carvedilol to 12.5 mg twice a day. Continue with amlodipine 10 mg a day. Her heart rate should be able to tolerate this. Patient denies chest pain, palpitations, syncope, swelling, SOB, nor PND  05/23/13-unfortunately had labrum tear, biceps tear and currently is in a sling, right-sided. Undergoing physical therapy. No problems with chest pain, shortness of breath, syncope. Her blood pressure is actually under pretty good control.   05/26/14 - right eye vision loss, ischemic optic neuropathy. MRI brain performed. Neurology consultation, 3 separate eye physicians. Blood pressure at today's visit is elevated. At home she states it has been running 137/84. She brought a new blood pressure cuff. She did bring her amlodipine down to 5 mg because of lower extremity edema. Continues with fatigue. Long-term  menopause.  08/12/14 - BPs sometimes >90 diastolic. Still not optimal control. Starting Bystolic 5 mg. We are going to stop carvedilol 6.25 mg twice a day.  10/14/14 - Bystolic did not tolerate. Doubled triamterine/HCT. Does not want to try beta blocker again. Depression. Cost of meds. No CP. SOB with stairs.   Wt Readings from Last 3 Encounters:  11/19/14 180 lb 12.8 oz (82.01 kg)  10/14/14 179 lb (81.194 kg)  08/12/14 176 lb (79.833 kg)     Past Medical History  Diagnosis Date  . GERD (gastroesophageal reflux disease)   . Hyperlipidemia   . Hypothyroidism   . Anxiety   . Sleep disturbance   . Hypercholesteremia   . Graves disease     with hyperthyroidism diagnosed at age 89 (treated with propylthiouracil for 2 years)    Past Surgical History  Procedure Laterality Date  . Rt ovary and fallopian tube removed due to cysts    . Carpal tunnel release      Current Outpatient Prescriptions  Medication Sig Dispense Refill  . ADDERALL XR 30 MG 24 hr capsule Take 30 mg by mouth daily.    Marland Kitchen. ALPRAZolam (XANAX) 0.5 MG tablet Take 0.5 mg by mouth at bedtime as needed for anxiety.    Marland Kitchen. amLODipine (NORVASC) 5 MG tablet Take 1 tablet (5 mg total) by mouth daily. 90 tablet 3  . aspirin 81 MG tablet Take 81 mg by mouth daily.    . B Complex-C (SUPER B COMPLEX PO) Take 1 tablet by mouth daily.    Marland Kitchen. BIOTIN 5000 PO Take  by mouth. 5OOO MCG BID    . cholecalciferol (VITAMIN D) 1000 UNITS tablet Take 2,000 Units by mouth daily.     . cyanocobalamin 1000 MCG tablet Take 100 mcg by mouth daily.    . hydrALAZINE (APRESOLINE) 25 MG tablet Take 1 tablet (25 mg total) by mouth 2 (two) times daily. 180 tablet 3  . levothyroxine (SYNTHROID, LEVOTHROID) 88 MCG tablet Take 88 mcg by mouth daily before breakfast.    . losartan (COZAAR) 100 MG tablet Take 100 mg by mouth daily.    Marland Kitchen lovastatin (MEVACOR) 40 MG tablet Take 40 mg by mouth at bedtime.    . Melatonin 5 MG TABS Take 10 mg by mouth at bedtime as  needed (for sleep).     . metFORMIN (GLUCOPHAGE) 500 MG tablet Take 500 mg by mouth 2 (two) times daily with a meal.    . potassium chloride 20 MEQ TBCR Take 40 mEq by mouth daily. 60 tablet 3  . pyridOXINE (VITAMIN B-6) 100 MG tablet Take 100 mg by mouth daily.    Marland Kitchen triamterene-hydrochlorothiazide (DYAZIDE) 37.5-25 MG per capsule Take 2 each (2 capsules total) by mouth daily. 180 capsule 3  . valACYclovir (VALTREX) 500 MG tablet Take 500 mg by mouth daily as needed (for outbreaks).     . venlafaxine XR (EFFEXOR-XR) 150 MG 24 hr capsule Take 150 mg by mouth daily.     No current facility-administered medications for this visit.    Allergies:    Allergies  Allergen Reactions  . Flagyl [Metronidazole] Nausea Only    Nausea and Headache  . Tetracyclines & Related Nausea And Vomiting    Social History:  The patient  reports that she has never smoked. She does not have any smokeless tobacco history on file. She reports that she drinks alcohol. She reports that she does not use illicit drugs. is a nonsmoker  ROS:  Please see the history of present illness.   Excessive sweating, fatigue, depression, anxiety, headache Denies any syncope, bleeding, orthopnea, PND. Prior ankle edema on amlodipine.    PHYSICAL EXAM: VS:  BP 130/88 mmHg  Pulse 104  Ht  (1.651 m)  Wt 180 lb 12.8 oz (82.01 kg)  BMI 30.09 kg/m2 Well nourished, well developed, in no acute distress HEENT: right eye blind Neck: no JVD Cardiac:  normal S1, S2; RRR; no murmur Lungs:  clear to auscultation bilaterally, no wheezing, rhonchi or rales Abd: soft, nontender, no hepatomegaly Ext: no edema Skin: warm and dry Neuro: no focal abnormalities noted  EKG:  05/26/14-sinus rhythm, 78, no other abnormalities previously Sinus rhythm, rate 82.   Nuclear stress test-2012-no ischemia, normal EF  ASSESSMENT AND PLAN:  1. Strong family history of CAD-mother and brother MI at age 66. Continue with aggressive primary  prevention. Heart rate mildly elevated secondary to ADD medication. Blood pressure control, statin. Exercise. 2. Right eye vision loss-carotid Doppler with mild bilateral plaque. Recommendation was to repeat in 2 years. This study was performed on 06/02/14. She has been diagnosed with anterior optic nerve ischemia. It is essential for her to maintain excellent blood pressure, cholesterol, weight loss, exercise. Her vision loss has been frustrating for her. She has had Lasik surgery in the past and her right eye was for distance. 3. Hypertension- better control current medications. in the past we had to stop beta blocker becausepulse too low on coreg 12.5.  It is likely that some of her mild ankle edema is secondary from amlodipine side effect.  On her own, she decreased her amlodipine to 5 mg. We will continue. Edema is improved. Tried Bysotlic 5 QD but did not tolerate and would cost her $150 a month.  Continue hydralazine 25 mg twice a day. I think clonidine cause excessive fatigue. She does not wish to go back to a beta blocker because of fatigue. I also encouraged walking, continued weight loss. Obviously if she has had an ischemic event causing right eye vision loss she needs to maintain excellent blood pressure. Continue with weight loss. Her home blood pressure monitoring has been reassuring. 4. Hyperlipidemia-Mark Hepler has been checking. On  Lovastatin. 5. One-month follow-up. Continue to take blood pressure readings. 6. Depression - continues to struggle with this. Encourage exercise. Recent change to Effexor. Cost of her other antidepressant was an issue. 7. 6 month follow-up    Signed, Donato SchultzMark Skains, MD [facility]  11/19/2014 2:45 PM

## 2015-01-21 ENCOUNTER — Other Ambulatory Visit: Payer: Self-pay | Admitting: Physician Assistant

## 2015-05-15 IMAGING — RF DG FLUORO GUIDE LUMBAR PUNCTURE
1 series · 1 of 1 positions shown · non-contrast
Comparison: none

CLINICAL DATA: Optic neuritis, possible multiple sclerosis.

[Series 1: run · 1 of 1 slices shown]
[im 1/1]
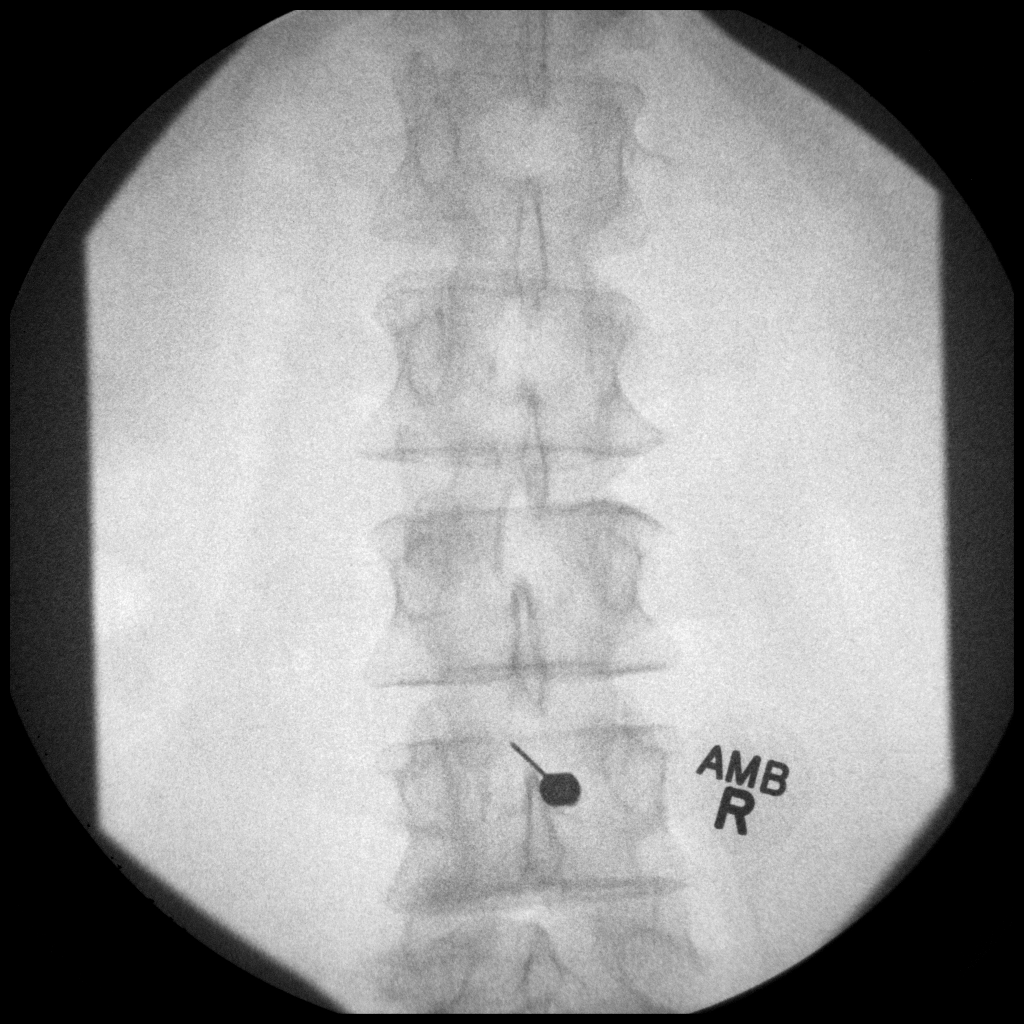

[1 of 1 positions shown; findings below may reference images not displayed]

EXAM:
DIAGNOSTIC LUMBAR PUNCTURE UNDER FLUOROSCOPIC GUIDANCE

FLUOROSCOPY TIME:  0 min, 22 seconds

PROCEDURE:
I discussed the risks (including hemorrhage, infection, headache,
and nerve damage, among others), benefits, and alternatives to
fluoroscopically guided lumbar puncture with the patient. We
specifically discussed the high technical likelihood of success of
the procedure. The patient understood and elected to undergo the
procedure.

Standard time-out was employed. Following sterile skin prep and
local anesthetic administration consisting of 1 percent lidocaine, a
22 gauge spinal needle was advanced without difficulty into the
thecal sac at the at the L3-4 level. Clear CSF was returned. Opening
pressure was 19 cm of water.

18 cc of clear CSF was collected. The needle was subsequently
removed and the skin cleansed and bandaged. No immediate
complications were observed.
IMPRESSION: 1. Technically successful fluoroscopically guided lumbar puncture
yielding 18 cc of clear CSF for testing. Opening pressure was 19 cm
of water.

## 2015-05-26 ENCOUNTER — Ambulatory Visit (INDEPENDENT_AMBULATORY_CARE_PROVIDER_SITE_OTHER): Payer: No Typology Code available for payment source | Admitting: Cardiology

## 2015-05-26 ENCOUNTER — Encounter: Payer: Self-pay | Admitting: Cardiology

## 2015-05-26 VITALS — BP 124/84 | HR 82 | Ht 65.0 in | Wt 173.4 lb

## 2015-05-26 DIAGNOSIS — I10 Essential (primary) hypertension: Secondary | ICD-10-CM | POA: Diagnosis not present

## 2015-05-26 DIAGNOSIS — H5461 Unqualified visual loss, right eye, normal vision left eye: Secondary | ICD-10-CM

## 2015-05-26 DIAGNOSIS — Z8249 Family history of ischemic heart disease and other diseases of the circulatory system: Secondary | ICD-10-CM

## 2015-05-26 DIAGNOSIS — H469 Unspecified optic neuritis: Secondary | ICD-10-CM

## 2015-05-26 DIAGNOSIS — F419 Anxiety disorder, unspecified: Secondary | ICD-10-CM | POA: Diagnosis not present

## 2015-05-26 NOTE — Patient Instructions (Signed)

## 2015-05-26 NOTE — Progress Notes (Signed)
[address] [place], [place]  [address] Phone: [phone] Fax:  [phone]  Date:  05/26/2015   ID:  Kathryn [name], DOB [birth year], MRN [id number]  PCP:  Lenora Boys, MD   History of Present Illness: Kathryn [name] is a 54 y.o. female with family hx of CAD with mother and brother having myocardial infarction at age 41, and she also has hx of hyperlipidemia, hypertension,and insulin resistance.  08/26/12 she saw Lovenia Kim who added norvasc 5 mg due to hypertension. She states she feels it has not really helped and that for the last few weeks she has been getting blood pressure readings in the 175-212/116-129 range.   On 09/02/12 with BP still elevated her Amlodipine was further increased to 10 mg po qd. Readings since then 160/104, 148/102, 177/105, She is very anxious, has headaches and feels exhausted. She previously was on Losartan HCT 100/25 mg qd and was switched due to her formulary and she is now on Triamterene/HCTZ, without losartan since 2/14.  10/03/12-blood pressure is better but still elevated at times. Fluctuating. I decided to increase her carvedilol to 12.5 mg twice a day. Continue with amlodipine 10 mg a day. Her heart rate should be able to tolerate this. Patient denies chest pain, palpitations, syncope, swelling, SOB, nor PND  05/23/13-unfortunately had labrum tear, biceps tear and currently is in a sling, right-sided. Undergoing physical therapy. No problems with chest pain, shortness of breath, syncope. Her blood pressure is actually under pretty good control.   05/26/14 - right eye vision loss, ischemic optic neuropathy. MRI brain performed. Neurology consultation, 3 separate eye physicians. Blood pressure at today's visit is elevated. At home she states it has been running 137/84. She brought a new blood pressure cuff. She did bring her amlodipine down to 5 mg because of lower extremity edema. Continues with fatigue. Long-term  menopause.  08/12/14 - BPs sometimes >90 diastolic. Still not optimal control. Starting Bystolic 5 mg. We are going to stop carvedilol 6.25 mg twice a day.  10/14/14 - Bystolic did not tolerate. Doubled triamterine/HCT. Does not want to try beta blocker again. Depression. Cost of meds. No CP. SOB with stairs.   05/26/15 - excessive sweating, dripping down. Went through menopause.   Wt Readings from Last 3 Encounters:  05/26/15 173 lb 6.4 oz (78.654 kg)  11/19/14 180 lb 12.8 oz (82.01 kg)  10/14/14 179 lb (81.194 kg)     Past Medical History  Diagnosis Date  . GERD (gastroesophageal reflux disease)   . Hyperlipidemia   . Hypothyroidism   . Anxiety   . Sleep disturbance   . Hypercholesteremia   . Graves disease     with hyperthyroidism diagnosed at age 62 (treated with propylthiouracil for 2 years)    Past Surgical History  Procedure Laterality Date  . Rt ovary and fallopian tube removed due to cysts    . Carpal tunnel release      Current Outpatient Prescriptions  Medication Sig Dispense Refill  . ADDERALL XR 30 MG 24 hr capsule Take 30 mg by mouth daily.    Marland Kitchen ALPRAZolam (XANAX) 0.5 MG tablet Take 0.5 mg by mouth at bedtime as needed for anxiety.    Marland Kitchen amLODipine (NORVASC) 5 MG tablet Take 1 tablet (5 mg total) by mouth daily. 90 tablet 3  . aspirin 81 MG tablet Take 81 mg by mouth daily.    . B Complex-C (SUPER B COMPLEX PO) Take  1 tablet by mouth daily.    Marland Kitchen. BIOTIN 5000 PO Take by mouth. 5OOO MCG BID    . cholecalciferol (VITAMIN D) 1000 UNITS tablet Take 2,000 Units by mouth daily.     . cyanocobalamin 1000 MCG tablet Take 100 mcg by mouth daily.    . hydrALAZINE (APRESOLINE) 25 MG tablet Take 1 tablet (25 mg total) by mouth 2 (two) times daily. 180 tablet 3  . levothyroxine (SYNTHROID, LEVOTHROID) 100 MCG tablet Take 100 mcg by mouth daily before breakfast.    . losartan (COZAAR) 100 MG tablet Take 100 mg by mouth daily.    Marland Kitchen. lovastatin (MEVACOR) 40 MG tablet Take 40 mg by  mouth at bedtime.    . Melatonin 5 MG TABS Take 10 mg by mouth at bedtime as needed (for sleep).     . metFORMIN (GLUCOPHAGE) 500 MG tablet Take 500 mg by mouth 2 (two) times daily with a meal.    . Potassium Chloride ER 20 MEQ TBCR TAKE 1 TABLET BY MOUTH 2 TIMES A DAY 60 tablet 6  . pyridOXINE (VITAMIN B-6) 100 MG tablet Take 100 mg by mouth daily.    Marland Kitchen. triamterene-hydrochlorothiazide (MAXZIDE) 75-50 MG per tablet Take 1 tablet by mouth daily. 90 tablet 3  . valACYclovir (VALTREX) 500 MG tablet Take 500 mg by mouth daily as needed (for outbreaks).     . venlafaxine XR (EFFEXOR-XR) 150 MG 24 hr capsule Take 150 mg by mouth daily.     No current facility-administered medications for this visit.    Allergies:    Allergies  Allergen Reactions  . Flagyl [Metronidazole] Nausea Only    Nausea and Headache  . Tetracyclines & Related Nausea And Vomiting    Social History:  The patient  reports that she has never smoked. She does not have any smokeless tobacco history on file. She reports that she drinks alcohol. She reports that she does not use illicit drugs. is a nonsmoker  ROS:  Please see the history of present illness.   Excessive sweating, fatigue, depression, anxiety, headache Denies any syncope, bleeding, orthopnea, PND. Prior ankle edema on amlodipine.    PHYSICAL EXAM: VS:  BP 124/84 mmHg  Pulse 82  Ht 5\' 5"  (1.651 m)  Wt 173 lb 6.4 oz (78.654 kg)  BMI 28.86 kg/m2 Well nourished, well developed, in no acute distress HEENT: right eye blind Neck: no JVD Cardiac:  normal S1, S2; RRR; no murmur Lungs:  clear to auscultation bilaterally, no wheezing, rhonchi or rales Abd: soft, nontender, no hepatomegaly Ext: no edema Skin: warm and dry Neuro: no focal abnormalities noted  EKG:  Today 05/26/15-sinus rhythm with PVC. No other abnormalities. Personally viewed-prior 05/26/14-sinus rhythm, 78, no other abnormalities previously Sinus rhythm, rate 82.   Nuclear stress test-2012-no  ischemia, normal EF  ASSESSMENT AND PLAN:  1. Strong family history of CAD-mother and brother MI at age 54. Continue with aggressive primary prevention. Heart rate mildly elevated secondary to ADD medication. Blood pressure control, statin. Exercise. 2. Right eye vision loss-carotid Doppler with mild bilateral plaque. Recommendation was to repeat in 2 years. This study was performed on 06/02/14. She has been diagnosed with anterior optic nerve ischemia. It is essential for her to maintain excellent blood pressure, cholesterol, weight loss, exercise. Her vision loss has been frustrating for her. She has had Lasik surgery in the past and her right eye was for distance. She describes it like a gray contact lens over her right eye. 3. Hypertension-much  better control with current medications. In the past we had to stop beta blocker because pulse too low on coreg 12.5.  Amlodipine to 5 mg. We will continue. Edema is improved. Tried Bysotlic 5 QD but did not tolerate and would cost her $150 a month.  Continue hydralazine 25 mg twice a day. I think clonidine cause excessive fatigue. She does not wish to go back to a beta blocker because of fatigue. I also encouraged walking, continued weight loss. Obviously if she has had an ischemic event causing right eye vision loss she needs to maintain excellent blood pressure. Continue with weight loss. Her home blood pressure monitoring has been reassuring. 4. Hyperlipidemia-Eann Cleland Hepler has been checking. On  Lovastatin. 5. One-month follow-up. Continue to take blood pressure readings. 6. Depression - continues to struggle with this. Encourage exercise.  Effexor. Cost of her other antidepressant was an issue. 7. Hyperhidrosis-likely hormonal type symptom. 8. 6 month follow-up    Signed, Donato Schultz, MD [facility]  05/26/2015 4:16 PM

## 2015-08-26 ENCOUNTER — Other Ambulatory Visit: Payer: Self-pay | Admitting: Physician Assistant

## 2015-09-09 ENCOUNTER — Other Ambulatory Visit: Payer: Self-pay | Admitting: Obstetrics and Gynecology

## 2015-09-10 LAB — CYTOLOGY - PAP

## 2015-10-01 ENCOUNTER — Other Ambulatory Visit: Payer: Self-pay | Admitting: Cardiology

## 2015-11-11 ENCOUNTER — Other Ambulatory Visit: Payer: Self-pay | Admitting: Cardiology

## 2015-11-30 ENCOUNTER — Ambulatory Visit (INDEPENDENT_AMBULATORY_CARE_PROVIDER_SITE_OTHER): Payer: 59 | Admitting: Cardiology

## 2015-11-30 ENCOUNTER — Encounter: Payer: Self-pay | Admitting: Cardiology

## 2015-11-30 VITALS — BP 118/72 | HR 85 | Ht 65.0 in | Wt 172.6 lb

## 2015-11-30 DIAGNOSIS — E7800 Pure hypercholesterolemia, unspecified: Secondary | ICD-10-CM

## 2015-11-30 DIAGNOSIS — I10 Essential (primary) hypertension: Secondary | ICD-10-CM

## 2015-11-30 DIAGNOSIS — Z79899 Other long term (current) drug therapy: Secondary | ICD-10-CM

## 2015-11-30 DIAGNOSIS — H5461 Unqualified visual loss, right eye, normal vision left eye: Secondary | ICD-10-CM

## 2015-11-30 DIAGNOSIS — Z8249 Family history of ischemic heart disease and other diseases of the circulatory system: Secondary | ICD-10-CM

## 2015-11-30 LAB — BASIC METABOLIC PANEL
BUN: 24 mg/dL (ref 7–25)
CALCIUM: 9.9 mg/dL (ref 8.6–10.4)
CO2: 27 mmol/L (ref 20–31)
Chloride: 99 mmol/L (ref 98–110)
Creat: 0.95 mg/dL (ref 0.50–1.05)
Glucose, Bld: 90 mg/dL (ref 65–99)
POTASSIUM: 3.8 mmol/L (ref 3.5–5.3)
SODIUM: 139 mmol/L (ref 135–146)

## 2015-11-30 MED ORDER — TRIAMTERENE-HCTZ 75-50 MG PO TABS: 1.0000 | ORAL_TABLET | Freq: Every day | ORAL | Status: DC

## 2015-11-30 MED ORDER — LOSARTAN POTASSIUM 100 MG PO TABS: 100.0000 mg | ORAL_TABLET | Freq: Every day | ORAL | Status: DC

## 2015-11-30 MED ORDER — AMLODIPINE BESYLATE 5 MG PO TABS: 5.0000 mg | ORAL_TABLET | Freq: Every day | ORAL | Status: DC

## 2015-11-30 MED ORDER — POTASSIUM CHLORIDE ER 20 MEQ PO TBCR: 1.0000 | EXTENDED_RELEASE_TABLET | Freq: Two times a day (BID) | ORAL | Status: DC

## 2015-11-30 NOTE — Patient Instructions (Signed)
Medication Instructions:  The current medical regimen is effective;  continue present plan and medications.  Labwork: Please have blood work today.  (BMP)  Follow-Up: Follow up in 1 year with Dr. Skains.  You will receive a letter in the mail 2 months before you are due.  Please call us when you receive this letter to schedule your follow up appointment.  If you need a refill on your cardiac medications before your next appointment, please call your pharmacy.  Thank you for choosing [facility]!!     

## 2015-11-30 NOTE — Progress Notes (Signed)
[address] [place], [place]  [address] Phone: [phone]) [phone] Fax:  [phone]  Date:  11/30/2015   ID:  Kathryn GarreLaura C [name], DOB 06/13/1961, MRN [id number]  PCP:  Lenora BoysFRIED, ROBERT L, MD   History of Present Illness: Kathryn GarreLaura C [name] is a 55 y.o. female with family hx of CAD with mother and brother having myocardial infarction at age 55, and she also has hx of hyperlipidemia, hypertension and insulin resistance.  Since our last meeting, she was in the current emergency department in February 2017. She was having chest discomfort, central chest pressure. Troponins were normal, EKG normal. A nuclear stress test was performed and the low risk with no ischemia. Reassurance.  Her blood pressure has been under excellent control over the past year. Medications reviewed. Could not tolerate beta blocker. She does admit to high anxiety at times.  She also had hypokalemia and is taking potassium supplementation despite potassium sparing diuretic combination pill. At one point, she remembers it was hard to raise her legs up because of low potassium.  She still frustrated by her right eye vision loss, anterior optic nerve ischemia.     Wt Readings from Last 3 Encounters:  11/30/15 172 lb 9.6 oz (78.291 kg)  05/26/15 173 lb 6.4 oz (78.654 kg)  11/19/14 180 lb 12.8 oz (82.01 kg)     Past Medical History  Diagnosis Date  . GERD (gastroesophageal reflux disease)   . Hyperlipidemia   . Hypothyroidism   . Anxiety   . Sleep disturbance   . Hypercholesteremia   . Graves disease     with hyperthyroidism diagnosed at age 55 (treated with propylthiouracil for 2 years)    Past Surgical History  Procedure Laterality Date  . Rt ovary and fallopian tube removed due to cysts    . Carpal tunnel release      Current Outpatient Prescriptions  Medication Sig Dispense Refill  . ADDERALL XR 30 MG 24 hr capsule Take 30 mg by mouth daily.    Marland Kitchen. ALPRAZolam (XANAX) 0.5 MG tablet Take 0.5 mg by  mouth at bedtime as needed for anxiety.    Marland Kitchen. amLODipine (NORVASC) 5 MG tablet Take 1 tablet (5 mg total) by mouth daily. 90 tablet 3  . aspirin 81 MG tablet Take 81 mg by mouth daily.    . B Complex-C (SUPER B COMPLEX PO) Take 1 tablet by mouth daily.    Marland Kitchen. BIOTIN 5000 PO Take by mouth. 5OOO MCG BID    . cholecalciferol (VITAMIN D) 1000 UNITS tablet Take 2,000 Units by mouth daily.     . cyanocobalamin 1000 MCG tablet Take 100 mcg by mouth daily.    . hydrALAZINE (APRESOLINE) 25 MG tablet Take 1 tablet (25 mg total) by mouth 2 (two) times daily. 180 tablet 3  . levothyroxine (SYNTHROID, LEVOTHROID) 100 MCG tablet Take 100 mcg by mouth daily before breakfast.    . losartan (COZAAR) 100 MG tablet Take 100 mg by mouth daily.    Marland Kitchen. lovastatin (MEVACOR) 40 MG tablet Take 40 mg by mouth at bedtime.    . Melatonin 5 MG TABS Take 10 mg by mouth at bedtime as needed (for sleep).     . metFORMIN (GLUCOPHAGE) 500 MG tablet Take 500 mg by mouth 2 (two) times daily with a meal.    . Potassium Chloride ER 20 MEQ TBCR TAKE 1 TABLET BY MOUTH 2 TIMES A DAY 60 tablet 8  . pyridOXINE (VITAMIN  B-6) 100 MG tablet Take 100 mg by mouth daily.    Marland Kitchen triamterene-hydrochlorothiazide (MAXZIDE) 75-50 MG per tablet Take 1 tablet by mouth daily. 90 tablet 3  . valACYclovir (VALTREX) 500 MG tablet Take 500 mg by mouth daily as needed (for outbreaks).     . venlafaxine XR (EFFEXOR-XR) 150 MG 24 hr capsule Take 150 mg by mouth daily.     No current facility-administered medications for this visit.    Allergies:    Allergies  Allergen Reactions  . Flagyl [Metronidazole] Nausea Only    Nausea and Headache  . Tetracyclines & Related Nausea And Vomiting    Social History:  The patient  reports that she has never smoked. She does not have any smokeless tobacco history on file. She reports that she drinks alcohol. She reports that she does not use illicit drugs. is a nonsmoker  ROS:  Please see the history of present illness.    Excessive sweating, fatigue, depression, anxiety, headache Denies any syncope, bleeding, orthopnea, PND. Prior ankle edema on amlodipine.    PHYSICAL EXAM: VS:  BP 118/72 mmHg  Pulse 85  Ht  (1.651 m)  Wt 172 lb 9.6 oz (78.291 kg)  BMI 28.72 kg/m2 Well nourished, well developed, in no acute distress HEENT: right eye blind Neck: no JVD Cardiac:  normal S1, S2; RRR; no murmur Lungs:  clear to auscultation bilaterally, no wheezing, rhonchi or rales Abd: soft, nontender, no hepatomegaly Ext: no edema Skin: warm and dry Neuro: no focal abnormalities noted  EKG:  Prior 05/26/15-sinus rhythm with PVC. No other abnormalities. Personally viewed-prior 05/26/14-sinus rhythm, 78, no other abnormalities previously Sinus rhythm, rate 82. Personally viewed  Nuclear stress test-2012-no ischemia, normal EF Nuclear stress test 08/2015-[place], -low risk, no ischemia. Troponin normal.  ASSESSMENT AND PLAN:  1. Strong family history of CAD-mother and brother MI at age 37. Continue with aggressive primary prevention. Heart rate mildly elevated secondary to ADD medication. Blood pressure control, statin. Exercise. 2. Right eye vision loss-carotid Doppler with mild bilateral plaque. Recommendation was to repeat in 2 years. This study was performed on 06/02/14. She has been diagnosed with anterior optic nerve ischemia. It is essential for her to maintain excellent blood pressure, cholesterol, weight loss, exercise. Her vision loss has been frustrating for her. She has had Lasik surgery in the past and her right eye was for distance. She describes it like a gray contact lens over her right eye. 3. Hypertension-much better control with current medications. In the past we had to stop beta blocker because pulse too low on coreg 12.5.  Amlodipine to 5 mg. We will continue. Edema is improved. Tried Bysotlic 5 QD but did not tolerate and would cost her $150 a month.  Continue hydralazine 25 mg twice a day.  I think clonidine could cause excessive fatigue. She does not wish to go back to a beta blocker because of fatigue. I also encouraged walking, continued weight loss. Obviously if she has had an ischemic event causing right eye vision loss she needs to maintain excellent blood pressure. Continue with weight loss. Her home blood pressure monitoring has been reassuring. Doing great over past year.  4. Hyperlipidemia-Mark Hepler has been checking. On  Lovastatin. 5. Hypokalemia-continue with potassium supplementation. Checking basic metabolic profile to see.  6. Depression - continues to struggle with this. Encourage exercise.  Effexor. Cost of her other antidepressant was an issue. 7. Hyperhidrosis-likely hormonal type symptom. 8. 12 month follow-up    Signed,  Donato Schultz, MD [facility]  11/30/2015 4:34 PM

## 2016-10-29 ENCOUNTER — Other Ambulatory Visit: Payer: Self-pay | Admitting: Cardiology

## 2016-11-07 ENCOUNTER — Other Ambulatory Visit: Payer: Self-pay | Admitting: Obstetrics and Gynecology

## 2016-11-09 LAB — CYTOLOGY - PAP

## 2017-01-16 ENCOUNTER — Other Ambulatory Visit: Payer: Self-pay | Admitting: Cardiology

## 2017-01-30 ENCOUNTER — Other Ambulatory Visit: Payer: Self-pay | Admitting: Cardiology

## 2018-09-09 ENCOUNTER — Ambulatory Visit: Payer: PRIVATE HEALTH INSURANCE | Admitting: Psychology

## 2018-09-09 DIAGNOSIS — F331 Major depressive disorder, recurrent, moderate: Secondary | ICD-10-CM

## 2018-09-30 ENCOUNTER — Other Ambulatory Visit: Payer: Self-pay

## 2018-09-30 ENCOUNTER — Ambulatory Visit: Payer: PRIVATE HEALTH INSURANCE | Admitting: Psychology

## 2018-09-30 DIAGNOSIS — F331 Major depressive disorder, recurrent, moderate: Secondary | ICD-10-CM

## 2018-10-14 ENCOUNTER — Ambulatory Visit (INDEPENDENT_AMBULATORY_CARE_PROVIDER_SITE_OTHER): Payer: PRIVATE HEALTH INSURANCE | Admitting: Psychology

## 2018-10-14 DIAGNOSIS — F331 Major depressive disorder, recurrent, moderate: Secondary | ICD-10-CM

## 2018-11-12 ENCOUNTER — Ambulatory Visit (INDEPENDENT_AMBULATORY_CARE_PROVIDER_SITE_OTHER): Payer: PRIVATE HEALTH INSURANCE | Admitting: Psychology

## 2018-11-12 DIAGNOSIS — F331 Major depressive disorder, recurrent, moderate: Secondary | ICD-10-CM

## 2018-11-26 ENCOUNTER — Ambulatory Visit (INDEPENDENT_AMBULATORY_CARE_PROVIDER_SITE_OTHER): Payer: PRIVATE HEALTH INSURANCE | Admitting: Psychology

## 2018-11-26 DIAGNOSIS — F331 Major depressive disorder, recurrent, moderate: Secondary | ICD-10-CM

## 2018-12-10 ENCOUNTER — Ambulatory Visit (INDEPENDENT_AMBULATORY_CARE_PROVIDER_SITE_OTHER): Payer: PRIVATE HEALTH INSURANCE | Admitting: Psychology

## 2018-12-10 DIAGNOSIS — F331 Major depressive disorder, recurrent, moderate: Secondary | ICD-10-CM | POA: Diagnosis not present

## 2018-12-24 ENCOUNTER — Ambulatory Visit (INDEPENDENT_AMBULATORY_CARE_PROVIDER_SITE_OTHER): Payer: PRIVATE HEALTH INSURANCE | Admitting: Psychology

## 2018-12-24 DIAGNOSIS — F331 Major depressive disorder, recurrent, moderate: Secondary | ICD-10-CM

## 2019-01-07 ENCOUNTER — Ambulatory Visit (INDEPENDENT_AMBULATORY_CARE_PROVIDER_SITE_OTHER): Payer: PRIVATE HEALTH INSURANCE | Admitting: Psychology

## 2019-01-07 DIAGNOSIS — F331 Major depressive disorder, recurrent, moderate: Secondary | ICD-10-CM

## 2019-01-20 ENCOUNTER — Emergency Department (HOSPITAL_COMMUNITY): Payer: PRIVATE HEALTH INSURANCE

## 2019-01-20 ENCOUNTER — Inpatient Hospital Stay (HOSPITAL_COMMUNITY)
Admission: EM | Admit: 2019-01-20 | Discharge: 2019-01-23 | DRG: 392 | Disposition: A | Discharge status: Home / Self Care | Payer: PRIVATE HEALTH INSURANCE | Attending: Internal Medicine | Admitting: Internal Medicine

## 2019-01-20 ENCOUNTER — Encounter (HOSPITAL_COMMUNITY): Payer: Self-pay

## 2019-01-20 ENCOUNTER — Other Ambulatory Visit: Payer: Self-pay

## 2019-01-20 DIAGNOSIS — Z8249 Family history of ischemic heart disease and other diseases of the circulatory system: Secondary | ICD-10-CM

## 2019-01-20 DIAGNOSIS — E0500 Thyrotoxicosis with diffuse goiter without thyrotoxic crisis or storm: Secondary | ICD-10-CM | POA: Diagnosis present

## 2019-01-20 DIAGNOSIS — N183 Chronic kidney disease, stage 3 (moderate): Secondary | ICD-10-CM | POA: Diagnosis present

## 2019-01-20 DIAGNOSIS — R079 Chest pain, unspecified: Secondary | ICD-10-CM | POA: Diagnosis not present

## 2019-01-20 DIAGNOSIS — E1122 Type 2 diabetes mellitus with diabetic chronic kidney disease: Secondary | ICD-10-CM | POA: Diagnosis present

## 2019-01-20 DIAGNOSIS — I1310 Hypertensive heart and chronic kidney disease without heart failure, with stage 1 through stage 4 chronic kidney disease, or unspecified chronic kidney disease: Secondary | ICD-10-CM | POA: Diagnosis present

## 2019-01-20 DIAGNOSIS — Z79890 Hormone replacement therapy (postmenopausal): Secondary | ICD-10-CM

## 2019-01-20 DIAGNOSIS — K2970 Gastritis, unspecified, without bleeding: Principal | ICD-10-CM | POA: Diagnosis present

## 2019-01-20 DIAGNOSIS — E039 Hypothyroidism, unspecified: Secondary | ICD-10-CM | POA: Diagnosis present

## 2019-01-20 DIAGNOSIS — E041 Nontoxic single thyroid nodule: Secondary | ICD-10-CM | POA: Diagnosis present

## 2019-01-20 DIAGNOSIS — Z7984 Long term (current) use of oral hypoglycemic drugs: Secondary | ICD-10-CM

## 2019-01-20 DIAGNOSIS — E860 Dehydration: Secondary | ICD-10-CM | POA: Diagnosis present

## 2019-01-20 DIAGNOSIS — Z7982 Long term (current) use of aspirin: Secondary | ICD-10-CM

## 2019-01-20 DIAGNOSIS — Z1159 Encounter for screening for other viral diseases: Secondary | ICD-10-CM

## 2019-01-20 DIAGNOSIS — N179 Acute kidney failure, unspecified: Secondary | ICD-10-CM | POA: Diagnosis present

## 2019-01-20 DIAGNOSIS — K219 Gastro-esophageal reflux disease without esophagitis: Secondary | ICD-10-CM | POA: Diagnosis present

## 2019-01-20 DIAGNOSIS — E7800 Pure hypercholesterolemia, unspecified: Secondary | ICD-10-CM | POA: Diagnosis present

## 2019-01-20 DIAGNOSIS — E876 Hypokalemia: Secondary | ICD-10-CM | POA: Diagnosis present

## 2019-01-20 DIAGNOSIS — I209 Angina pectoris, unspecified: Secondary | ICD-10-CM

## 2019-01-20 DIAGNOSIS — E785 Hyperlipidemia, unspecified: Secondary | ICD-10-CM | POA: Diagnosis present

## 2019-01-20 DIAGNOSIS — Z883 Allergy status to other anti-infective agents status: Secondary | ICD-10-CM

## 2019-01-20 DIAGNOSIS — I119 Hypertensive heart disease without heart failure: Secondary | ICD-10-CM | POA: Diagnosis present

## 2019-01-20 DIAGNOSIS — F419 Anxiety disorder, unspecified: Secondary | ICD-10-CM | POA: Diagnosis present

## 2019-01-20 DIAGNOSIS — I10 Essential (primary) hypertension: Secondary | ICD-10-CM | POA: Diagnosis present

## 2019-01-20 LAB — CBC
HCT: 43 % (ref 36.0–46.0)
Hemoglobin: 15.5 g/dL — ABNORMAL HIGH (ref 12.0–15.0)
MCH: 30.8 pg (ref 26.0–34.0)
MCHC: 36 g/dL (ref 30.0–36.0)
MCV: 85.5 fL (ref 80.0–100.0)
Platelets: 358 10*3/uL (ref 150–400)
RBC: 5.03 MIL/uL (ref 3.87–5.11)
RDW: 11.6 % (ref 11.5–15.5)
WBC: 9.4 10*3/uL (ref 4.0–10.5)
nRBC: 0 % (ref 0.0–0.2)

## 2019-01-20 LAB — BASIC METABOLIC PANEL
Anion gap: 16 — ABNORMAL HIGH (ref 5–15)
BUN: 28 mg/dL — ABNORMAL HIGH (ref 6–20)
CO2: 19 mmol/L — ABNORMAL LOW (ref 22–32)
Calcium: 10 mg/dL (ref 8.9–10.3)
Chloride: 99 mmol/L (ref 98–111)
Creatinine, Ser: 2.11 mg/dL — ABNORMAL HIGH (ref 0.44–1.00)
GFR calc Af Amer: 29 mL/min — ABNORMAL LOW (ref >60)
GFR calc non Af Amer: 25 mL/min — ABNORMAL LOW (ref >60)
Glucose, Bld: 101 mg/dL — ABNORMAL HIGH (ref 70–99)
Potassium: 3.2 mmol/L — ABNORMAL LOW (ref 3.5–5.1)
Sodium: 134 mmol/L — ABNORMAL LOW (ref 135–145)

## 2019-01-20 LAB — I-STAT BETA HCG BLOOD, ED (MC, WL, AP ONLY): I-stat hCG, quantitative: <5 m[IU]/mL (ref <5)

## 2019-01-20 LAB — TROPONIN I (HIGH SENSITIVITY): Troponin I (High Sensitivity): 32 ng/L — ABNORMAL HIGH (ref <18)

## 2019-01-20 NOTE — ED Triage Notes (Signed)
Pt reports [place] was [place] yesterday and stated that she developed chest pressure while hiking yesterday. States associated SOB w/ worsening pain today and HA. Pt endorses diaphoresis, chills and labile BP when checking it at home

## 2019-01-21 ENCOUNTER — Encounter (HOSPITAL_COMMUNITY): Payer: Self-pay | Admitting: General Practice

## 2019-01-21 DIAGNOSIS — R509 Fever, unspecified: Secondary | ICD-10-CM | POA: Diagnosis not present

## 2019-01-21 DIAGNOSIS — E0500 Thyrotoxicosis with diffuse goiter without thyrotoxic crisis or storm: Secondary | ICD-10-CM

## 2019-01-21 DIAGNOSIS — R079 Chest pain, unspecified: Secondary | ICD-10-CM | POA: Diagnosis present

## 2019-01-21 DIAGNOSIS — I10 Essential (primary) hypertension: Secondary | ICD-10-CM | POA: Diagnosis not present

## 2019-01-21 DIAGNOSIS — E041 Nontoxic single thyroid nodule: Secondary | ICD-10-CM | POA: Diagnosis present

## 2019-01-21 DIAGNOSIS — N179 Acute kidney failure, unspecified: Secondary | ICD-10-CM

## 2019-01-21 DIAGNOSIS — E7800 Pure hypercholesterolemia, unspecified: Secondary | ICD-10-CM

## 2019-01-21 HISTORY — DX: Acute kidney failure, unspecified: N17.9

## 2019-01-21 LAB — BASIC METABOLIC PANEL
Anion gap: 13 (ref 5–15)
BUN: 34 mg/dL — ABNORMAL HIGH (ref 6–20)
CO2: 21 mmol/L — ABNORMAL LOW (ref 22–32)
Calcium: 9 mg/dL (ref 8.9–10.3)
Chloride: 102 mmol/L (ref 98–111)
Creatinine, Ser: 2.31 mg/dL — ABNORMAL HIGH (ref 0.44–1.00)
GFR calc Af Amer: 26 mL/min — ABNORMAL LOW (ref >60)
GFR calc non Af Amer: 23 mL/min — ABNORMAL LOW (ref >60)
Glucose, Bld: 106 mg/dL — ABNORMAL HIGH (ref 70–99)
Potassium: 3.5 mmol/L (ref 3.5–5.1)
Sodium: 136 mmol/L (ref 135–145)

## 2019-01-21 LAB — URINALYSIS, COMPLETE (UACMP) WITH MICROSCOPIC
Bilirubin Urine: NEGATIVE
Glucose, UA: NEGATIVE mg/dL
Hgb urine dipstick: NEGATIVE
Ketones, ur: NEGATIVE mg/dL
Nitrite: NEGATIVE
Protein, ur: NEGATIVE mg/dL
Specific Gravity, Urine: 1.01 (ref 1.005–1.030)
pH: 5 (ref 5.0–8.0)

## 2019-01-21 LAB — HIV ANTIBODY (ROUTINE TESTING W REFLEX): HIV Screen 4th Generation wRfx: NONREACTIVE

## 2019-01-21 LAB — T4, FREE: Free T4: 1.51 ng/dL — ABNORMAL HIGH (ref 0.61–1.12)

## 2019-01-21 LAB — CBG MONITORING, ED
Glucose-Capillary: 103 mg/dL — ABNORMAL HIGH (ref 70–99)
Glucose-Capillary: 112 mg/dL — ABNORMAL HIGH (ref 70–99)
Glucose-Capillary: 102 mg/dL — ABNORMAL HIGH (ref 70–99)

## 2019-01-21 LAB — TSH: TSH: 3.858 u[IU]/mL (ref 0.350–4.500)

## 2019-01-21 LAB — HEMOGLOBIN A1C
Hgb A1c MFr Bld: 5.8 % — ABNORMAL HIGH (ref 4.8–5.6)
Mean Plasma Glucose: 119.76 mg/dL

## 2019-01-21 LAB — GLUCOSE, CAPILLARY
Glucose-Capillary: 113 mg/dL — ABNORMAL HIGH (ref 70–99)
Glucose-Capillary: 123 mg/dL — ABNORMAL HIGH (ref 70–99)

## 2019-01-21 LAB — TROPONIN I (HIGH SENSITIVITY)
Troponin I (High Sensitivity): 29 ng/L — ABNORMAL HIGH (ref <18)
Troponin I (High Sensitivity): 27 ng/L — ABNORMAL HIGH (ref <18)

## 2019-01-21 LAB — SARS CORONAVIRUS 2 BY RT PCR (HOSPITAL ORDER, PERFORMED IN ~~LOC~~ HOSPITAL LAB): SARS Coronavirus 2: NEGATIVE

## 2019-01-21 MED ORDER — PANTOPRAZOLE SODIUM 40 MG PO TBEC
40.0000 mg | DELAYED_RELEASE_TABLET | Freq: Every day | ORAL | Status: DC
  Administered 2019-01-21 – 2019-01-23 (×3): 40 mg via ORAL
  Filled 2019-01-21 (×3): qty 1

## 2019-01-21 MED ORDER — ACETAMINOPHEN 325 MG PO TABS
650.0000 mg | ORAL_TABLET | Freq: Once | ORAL | Status: AC
  Administered 2019-01-21: 650 mg via ORAL
  Filled 2019-01-21: qty 2

## 2019-01-21 MED ORDER — HYDRALAZINE HCL 25 MG PO TABS
25.0000 mg | ORAL_TABLET | Freq: Every day | ORAL | Status: DC
  Administered 2019-01-21 – 2019-01-22 (×3): 25 mg via ORAL
  Filled 2019-01-21 (×3): qty 1

## 2019-01-21 MED ORDER — POTASSIUM CHLORIDE CRYS ER 20 MEQ PO TBCR
40.0000 meq | EXTENDED_RELEASE_TABLET | Freq: Once | ORAL | Status: AC
  Administered 2019-01-21: 01:00:00 40 meq via ORAL
  Filled 2019-01-21: qty 2

## 2019-01-21 MED ORDER — SODIUM CHLORIDE 0.9 % IV BOLUS
1000.0000 mL | Freq: Once | INTRAVENOUS | Status: AC
  Administered 2019-01-21: 1000 mL via INTRAVENOUS

## 2019-01-21 MED ORDER — LOSARTAN POTASSIUM 50 MG PO TABS: 100.0000 mg | ORAL_TABLET | Freq: Every day | ORAL | Status: DC

## 2019-01-21 MED ORDER — INSULIN ASPART 100 UNIT/ML ~~LOC~~ SOLN: 0.0000 [IU] | SUBCUTANEOUS | Status: DC

## 2019-01-21 MED ORDER — SODIUM CHLORIDE 0.9 % IV SOLN
INTRAVENOUS | Status: DC
  Administered 2019-01-21 – 2019-01-23 (×5): via INTRAVENOUS

## 2019-01-21 MED ORDER — ALPRAZOLAM 0.5 MG PO TABS
0.5000 mg | ORAL_TABLET | Freq: Every evening | ORAL | Status: DC | PRN
  Administered 2019-01-21 – 2019-01-22 (×2): 0.5 mg via ORAL
  Filled 2019-01-21 (×2): qty 1

## 2019-01-21 MED ORDER — BUPROPION HCL ER (XL) 150 MG PO TB24
300.0000 mg | ORAL_TABLET | Freq: Every day | ORAL | Status: DC
  Administered 2019-01-21 – 2019-01-23 (×3): 300 mg via ORAL
  Filled 2019-01-21 (×3): qty 2

## 2019-01-21 MED ORDER — ASPIRIN 81 MG PO CHEW
324.0000 mg | CHEWABLE_TABLET | Freq: Once | ORAL | Status: AC
  Administered 2019-01-21: 01:00:00 324 mg via ORAL
  Filled 2019-01-21: qty 4

## 2019-01-21 MED ORDER — ENOXAPARIN SODIUM 30 MG/0.3ML ~~LOC~~ SOLN
30.0000 mg | Freq: Every day | SUBCUTANEOUS | Status: DC
  Administered 2019-01-21 – 2019-01-22 (×2): 30 mg via SUBCUTANEOUS
  Filled 2019-01-21 (×3): qty 0.3

## 2019-01-21 MED ORDER — DESVENLAFAXINE SUCCINATE ER 100 MG PO TB24
100.0000 mg | ORAL_TABLET | Freq: Every day | ORAL | Status: DC
  Administered 2019-01-21 – 2019-01-23 (×3): 100 mg via ORAL
  Filled 2019-01-21 (×3): qty 1

## 2019-01-21 MED ORDER — ACETAMINOPHEN 325 MG PO TABS
650.0000 mg | ORAL_TABLET | ORAL | Status: DC | PRN
  Administered 2019-01-22: 650 mg via ORAL
  Filled 2019-01-21: qty 2

## 2019-01-21 MED ORDER — ONDANSETRON HCL 4 MG/2ML IJ SOLN: 4.0000 mg | Freq: Four times a day (QID) | INTRAMUSCULAR | Status: DC | PRN

## 2019-01-21 MED ORDER — VITAMIN B-12 1000 MCG PO TABS
3000.0000 ug | ORAL_TABLET | Freq: Every day | ORAL | Status: DC
  Administered 2019-01-21 – 2019-01-23 (×3): 3000 ug via ORAL
  Filled 2019-01-21 (×3): qty 3

## 2019-01-21 MED ORDER — NITROGLYCERIN 0.4 MG SL SUBL
0.4000 mg | SUBLINGUAL_TABLET | SUBLINGUAL | Status: DC | PRN
  Administered 2019-01-21 (×2): 0.4 mg via SUBLINGUAL
  Filled 2019-01-21: qty 1

## 2019-01-21 MED ORDER — PRAVASTATIN SODIUM 40 MG PO TABS
40.0000 mg | ORAL_TABLET | Freq: Every day | ORAL | Status: DC
  Administered 2019-01-21 – 2019-01-22 (×2): 40 mg via ORAL
  Filled 2019-01-21 (×2): qty 1

## 2019-01-21 MED ORDER — AMLODIPINE BESYLATE 5 MG PO TABS
5.0000 mg | ORAL_TABLET | Freq: Every day | ORAL | Status: DC
  Administered 2019-01-21 – 2019-01-23 (×3): 5 mg via ORAL
  Filled 2019-01-21 (×3): qty 1

## 2019-01-21 MED ORDER — LEVOTHYROXINE SODIUM 112 MCG PO TABS
112.0000 ug | ORAL_TABLET | Freq: Every day | ORAL | Status: DC
  Administered 2019-01-21 – 2019-01-23 (×3): 112 ug via ORAL
  Filled 2019-01-21 (×3): qty 1

## 2019-01-21 MED ORDER — SUCRALFATE 1 GM/10ML PO SUSP
1.0000 g | Freq: Three times a day (TID) | ORAL | Status: DC
  Administered 2019-01-21 – 2019-01-23 (×7): 1 g via ORAL
  Filled 2019-01-21 (×7): qty 10

## 2019-01-21 MED ORDER — ASPIRIN EC 81 MG PO TBEC
81.0000 mg | DELAYED_RELEASE_TABLET | Freq: Every day | ORAL | Status: DC
  Administered 2019-01-21 – 2019-01-23 (×3): 81 mg via ORAL
  Filled 2019-01-21 (×3): qty 1

## 2019-01-21 NOTE — H&P (Signed)
History and Physical    Kathryn [name] RKY:[id number] DOB: 1961-03-02 DOA: 01/20/2019  PCP: Jettie Booze, NP  Patient coming from: Home  I have personally briefly reviewed patient's old medical records in [place]  Chief Complaint: CP  HPI: Kathryn [name] is a 58 y.o. female with medical history significant of Graves disease, HLD.  Patient presents to ED with c/o chest pain.  Pressure like sensation, associated with SOB, worsening throughout day yesterday.  Symptoms onset [place]  Associated chills, and ? Of fever.  Associated diaphoresis.  Not sure if she got dehydrated.   ED Course: Tm 99.9.  Trops were 32 and 29.  K 3.2, Sodium 134, Creat 2.11 (up from 1.2 x1 month ago).  BUN 28.  CXR neg.  EKG just shows borderline left axis deviation.  CP not really helped by NTG.   Review of Systems: As per HPI otherwise 10 point review of systems negative.   Past Medical History:  Diagnosis Date  . Anxiety   . GERD (gastroesophageal reflux disease)   . Graves disease    with hyperthyroidism diagnosed at age 37 (treated with propylthiouracil for 2 years)  . Hypercholesteremia   . Hyperlipidemia   . Hypothyroidism   . Sleep disturbance     Past Surgical History:  Procedure Laterality Date  . CARPAL TUNNEL RELEASE    . Rt ovary and fallopian tube removed due to cysts       reports that she has never smoked. She does not have any smokeless tobacco history on file. She reports current alcohol use. She reports that she does not use drugs.  Allergies  Allergen Reactions  . Flagyl [Metronidazole] Nausea Only    Nausea and Headache  . Tetracyclines & Related Nausea And Vomiting    Family History  Problem Relation Age of Onset  . Heart disease Mother        died of heart failure  . Heart attack Mother 38     Prior to Admission medications   Medication Sig Start Date End Date Taking? Authorizing Provider  ADDERALL XR 30 MG 24 hr capsule Take  30 mg by mouth daily. 10/28/14  Yes [provider]  ALPRAZolam Duanne Moron) 0.5 MG tablet Take 0.5 mg by mouth at bedtime as needed for anxiety.   Yes [provider]  amLODipine (NORVASC) 5 MG tablet TAKE 1 TABLET BY MOUTH  DAILY Patient taking differently: Take 5 mg by mouth daily.  01/16/17  Yes Jerline Pain, MD  aspirin 81 MG tablet Take 81 mg by mouth daily.   Yes [provider]  BIOTIN 5000 PO Take 5,000 mg by mouth at bedtime. 5OOO MCG BID    Yes [provider]  buPROPion (WELLBUTRIN XL) 300 MG 24 hr tablet Take 300 mg by mouth every morning. 10/31/18  Yes [provider]  cyanocobalamin 1000 MCG tablet Take 3,000 mcg by mouth daily.    Yes [provider]  desvenlafaxine (PRISTIQ) 100 MG 24 hr tablet Take 100 mg by mouth every morning. 10/31/18  Yes [provider]  Ginger, Zingiber officinalis, (GINGER PO) Take 12 mg by mouth every morning.   Yes [provider]  hydrALAZINE (APRESOLINE) 25 MG tablet Take 1 tablet (25 mg total) by mouth 2 (two) times daily. Patient taking differently: Take 25 mg by mouth at bedtime.  10/14/14  Yes Jerline Pain, MD  levothyroxine (SYNTHROID, LEVOTHROID) 100 MCG tablet Take 112  mcg by mouth daily before breakfast.    Yes [provider]  losartan (COZAAR) 100 MG tablet TAKE 1 TABLET BY MOUTH  DAILY Patient taking differently: Take 100 mg by mouth every morning.  01/30/17  Yes Jake BatheSkains, Mark C, MD  lovastatin (MEVACOR) 40 MG tablet Take 40 mg by mouth at bedtime.   Yes [provider]  Melatonin 5 MG TABS Take 10 mg by mouth at bedtime as needed (for sleep).    Yes [provider]  metFORMIN (GLUCOPHAGE) 500 MG tablet Take 500 mg by mouth 2 (two) times daily with a meal.   Yes [provider]  Potassium Chloride ER 20 MEQ TBCR TAKE 1 TABLET BY MOUTH TWO  TIMES DAILY Patient taking differently: Take 20 mEq by mouth daily as needed (low potassium).  01/30/17   Yes Jake BatheSkains, Mark C, MD  triamterene-hydrochlorothiazide (MAXZIDE) 75-50 MG tablet Take 1 tablet by mouth daily. 11/30/15  Yes Jake BatheSkains, Mark C, MD  TURMERIC PO Take 270 mg by mouth every morning.   Yes [provider]  valACYclovir (VALTREX) 500 MG tablet Take 500 mg by mouth daily as needed (for outbreaks).    Yes [provider]    Physical Exam: Vitals:   01/20/19 2008 01/20/19 2242 01/21/19 0122 01/21/19 0257  BP: (!) 141/87 (!) 153/92 (!) 153/82 136/75  Pulse: 65 68 61 (!) 54  Resp: 18 18 13 15   Temp:    98.4 F (36.9 C)  TempSrc:    Oral  SpO2: 100% 100% 99% 100%  Weight:      Height:        Constitutional: NAD, calm, comfortable Eyes: PERRL, lids and conjunctivae normal ENMT: Mucous membranes are moist. Posterior pharynx clear of any exudate or lesions.Normal dentition.  Neck: normal, supple, no masses, no thyromegaly Respiratory: clear to auscultation bilaterally, no wheezing, no crackles. Normal respiratory effort. No accessory muscle use.  Cardiovascular: Regular rate and rhythm, no murmurs / rubs / gallops. No extremity edema. 2+ pedal pulses. No carotid bruits.  Abdomen: no tenderness, no masses palpated. No hepatosplenomegaly. Bowel sounds positive.  Musculoskeletal: no clubbing / cyanosis. No joint deformity upper and lower extremities. Good ROM, no contractures. Normal muscle tone.  Skin: no rashes, lesions, ulcers. No induration Neurologic: CN 2-12 grossly intact. Sensation intact, DTR normal. Strength 5/5 in all 4.  Psychiatric: Normal judgment and insight. Alert and oriented x 3. Normal mood.    Labs on Admission: I have personally reviewed following labs and imaging studies  CBC: Recent Labs  Lab 01/20/19 1820  WBC 9.4  HGB 15.5*  HCT 43.0  MCV 85.5  PLT 358   Basic Metabolic Panel: Recent Labs  Lab 01/20/19 1820  NA 134*  K 3.2*  CL 99  CO2 19*  GLUCOSE 101*  BUN 28*  CREATININE 2.11*  CALCIUM 10.0   GFR: Estimated  Creatinine Clearance: 29.8 mL/min (A) (by C-G formula based on SCr of 2.11 mg/dL (H)). Liver Function Tests: No results for input(s): AST, ALT, ALKPHOS, BILITOT, PROT, ALBUMIN in the last 168 hours. No results for input(s): LIPASE, AMYLASE in the last 168 hours. No results for input(s): AMMONIA in the last 168 hours. Coagulation Profile: No results for input(s): INR, PROTIME in the last 168 hours. Cardiac Enzymes: No results for input(s): CKTOTAL, CKMB, CKMBINDEX, TROPONINI in the last 168 hours. BNP (last 3 results) No results for input(s): PROBNP in the last 8760 hours. HbA1C: No results for input(s): HGBA1C in the last 72  hours. CBG: No results for input(s): GLUCAP in the last 168 hours. Lipid Profile: No results for input(s): CHOL, HDL, LDLCALC, TRIG, CHOLHDL, LDLDIRECT in the last 72 hours. Thyroid Function Tests: No results for input(s): TSH, T4TOTAL, FREET4, T3FREE, THYROIDAB in the last 72 hours. Anemia Panel: No results for input(s): VITAMINB12, FOLATE, FERRITIN, TIBC, IRON, RETICCTPCT in the last 72 hours. Urine analysis: No results found for: COLORURINE, APPEARANCEUR, LABSPEC, PHURINE, GLUCOSEU, HGBUR, BILIRUBINUR, KETONESUR, PROTEINUR, UROBILINOGEN, NITRITE, LEUKOCYTESUR  Radiological Exams on Admission: Dg Chest 2 View  Result Date: 01/20/2019 CLINICAL DATA:  Chest pain EXAM: CHEST - 2 VIEW COMPARISON:  None. FINDINGS: The heart size and mediastinal contours are within normal limits. Both lungs are clear. The visualized skeletal structures are unremarkable. IMPRESSION: No active cardiopulmonary disease. Electronically Signed   By: Katherine Mantlehristopher  Green M.D.   On: 01/20/2019 18:56    EKG: Independently reviewed.  Assessment/Plan Principal Problem:   AKI (acute kidney injury) (HCC) Active Problems:   Hypercholesteremia   Graves disease   Essential hypertension   Chest pain, rule out acute myocardial infarction   Fever and chills   Thyroid nodule    1. CP - 1. CP  obs pathway 2. ASA 324 in ED given, continue 81 daily. 3. Serial trops 4. Tele monitor 5. NPO 6. May need cards eval in AM depending on remainder of work up. 2. AKI - 1. Dehydration due to hiking on Sunday? Especially in setting of chronic diuretic use for HTN? 2. IVF: 1L NS bolus then 125 cc/hr NS 3. Strict intake and output 4. Will need further work up (urine lytes, renal US, etc) if not improving with hydration. 3. HTN - 1. Hold diuretics and losartan due to AKI 2. Continue amlodipine 4. Chills and ?Fever - 1. Tm 99.9 in ED 2. COVID-19 testing pending 3. CXR neg and no O2 requirement 4. No pericarditis findings on EKG, and CP not positional 5. UA pending 5. H/o graves - 1. Now hypothyroid chronically and on synthroid 2. TSH and FT4 looked okay last month 3. But given presentation and fever will recheck these 4. Also has a thyroid nodule that it looks like she needs FNA for based on [facility] imaging results from 6/30.  Looks like PCP is scheduling it based on office visit 7/2 note. 6. HLD - continue statin  DVT prophylaxis: Lovenox Code Status: Full Family Communication: No family in room Disposition Plan: Home after admit Consults called: None Admission status: Place in 24obs    GARDNER, JARED M. DO [facility]  How to contact the [facility] Attending or Consulting provider 7A - 7P or covering provider during after hours 7P -7A, for this patient?  1. Check the care team in [facility] and look for a) attending/consulting [facility] provider listed and b) the [facility] team listed 2. Log into [email or web address]  Amion Physician Scheduling and messaging for groups and whole hospitals  On call and physician scheduling software for group practices, residents, hospitalists and other medical providers for call, clinic, rotation and shift schedules. On[employer] is a hospital-wide system for scheduling doctors and paging doctors on call. EasyPlot is for scientific plotting and data analysis.   [email or web address]  and use 's universal password to access. If you do not have the password, please contact the hospital operator.  3. Locate the [facility] provider you are looking for under [facility] and page to a number that you can be directly reached. 4. If you still have difficulty reaching the provider, please page  the [facility] (Director on Call) for the Hospitalists listed on amion for assistance.  01/21/2019, 3:04 AM

## 2019-01-21 NOTE — ED Provider Notes (Signed)
[facility] Provider Note   CSN: [id number] Arrival date & time: 01/20/19  1807    History   Chief Complaint Chief Complaint  Patient presents with  . Chest Pain    HPI Kathryn [name] is a 58 y.o. female.  HPI: A 58 year old patient with a history of hypertension and hypercholesterolemia presents for evaluation of chest pain. Initial onset of pain was more than 6 hours ago. The patient's chest pain is described as heaviness/pressure/tightness and is worse with exertion. The patient complains of nausea and reports some diaphoresis. The patient's chest pain is middle- or left-sided, is not well-localized, is not sharp and does not radiate to the arms/jaw/neck. The patient has a family history of coronary artery disease in a first-degree relative with onset less than age 58. The patient has no history of stroke, has no history of peripheral artery disease, has not smoked in the past 90 days, denies any history of treated diabetes and does not have an elevated BMI (>=30).   Patient's chest pain has been present since Sunday when she had gone for hiking.  Since then she has been having heartburn type discomfort along with pressure and heaviness over her chest described as someone sitting on her.  The pain is also radiated to her neck and to her scapular region.  She has associated shortness of breath and has broke out in sweats.  Pain is not positional.  No recent infection.  HPI  Past Medical History:  Diagnosis Date  . Anxiety   . GERD (gastroesophageal reflux disease)   . Graves disease    with hyperthyroidism diagnosed at age 58 (treated with propylthiouracil for 2 years)  . Hypercholesteremia   . Hyperlipidemia   . Hypothyroidism   . Sleep disturbance     Patient Active Problem List   Diagnosis Date Noted  . AKI (acute kidney injury) (HCC) 01/21/2019  . Chest pain, rule out acute myocardial infarction 01/21/2019  . Fever and chills  01/21/2019  . Thyroid nodule 01/21/2019  . Vision loss, right eye 05/26/2014  . Optic neuritis 12/05/2013  . Essential hypertension 05/23/2013  . Family history of ischemic heart disease 05/23/2013  . GERD (gastroesophageal reflux disease)   . Hyperlipidemia   . Hypothyroidism   . Anxiety   . Sleep disturbance   . Hypercholesteremia   . Graves disease     Past Surgical History:  Procedure Laterality Date  . CARPAL TUNNEL RELEASE    . Rt ovary and fallopian tube removed due to cysts       OB History   No obstetric history on file.      Home Medications    Prior to Admission medications   Medication Sig Start Date End Date Taking? Authorizing Provider  ADDERALL XR 30 MG 24 hr capsule Take 30 mg by mouth daily. 10/28/14  Yes [provider]  ALPRAZolam Prudy Feeler(XANAX) 0.5 MG tablet Take 0.5 mg by mouth at bedtime as needed for anxiety.   Yes [provider]  amLODipine (NORVASC) 5 MG tablet TAKE 1 TABLET BY MOUTH  DAILY Patient taking differently: Take 5 mg by mouth daily.  01/16/17  Yes Jake BatheSkains, Mark C, MD  aspirin 81 MG tablet Take 81 mg by mouth daily.   Yes [provider]  BIOTIN 5000 PO Take 5,000 mg by mouth at bedtime. 5OOO MCG BID    Yes [provider]  buPROPion (WELLBUTRIN XL) 300 MG 24 hr tablet Take 300 mg  by mouth every morning. 10/31/18  Yes [provider]  cyanocobalamin 1000 MCG tablet Take 3,000 mcg by mouth daily.    Yes [provider]  desvenlafaxine (PRISTIQ) 100 MG 24 hr tablet Take 100 mg by mouth every morning. 10/31/18  Yes [provider]  Ginger, Zingiber officinalis, (GINGER PO) Take 12 mg by mouth every morning.   Yes [provider]  hydrALAZINE (APRESOLINE) 25 MG tablet Take 1 tablet (25 mg total) by mouth 2 (two) times daily. Patient taking differently: Take 25 mg by mouth at bedtime.  10/14/14  Yes Jake BatheSkains, Mark C, MD  levothyroxine (SYNTHROID, LEVOTHROID) 100 MCG tablet Take 112 mcg by  mouth daily before breakfast.    Yes [provider]  losartan (COZAAR) 100 MG tablet TAKE 1 TABLET BY MOUTH  DAILY Patient taking differently: Take 100 mg by mouth every morning.  01/30/17  Yes Jake BatheSkains, Mark C, MD  lovastatin (MEVACOR) 40 MG tablet Take 40 mg by mouth at bedtime.   Yes [provider]  Melatonin 5 MG TABS Take 10 mg by mouth at bedtime as needed (for sleep).    Yes [provider]  metFORMIN (GLUCOPHAGE) 500 MG tablet Take 500 mg by mouth 2 (two) times daily with a meal.   Yes [provider]  Potassium Chloride ER 20 MEQ TBCR TAKE 1 TABLET BY MOUTH TWO  TIMES DAILY Patient taking differently: Take 20 mEq by mouth daily as needed (low potassium).  01/30/17  Yes Jake BatheSkains, Mark C, MD  triamterene-hydrochlorothiazide (MAXZIDE) 75-50 MG tablet Take 1 tablet by mouth daily. 11/30/15  Yes Jake BatheSkains, Mark C, MD  TURMERIC PO Take 270 mg by mouth every morning.   Yes [provider]  valACYclovir (VALTREX) 500 MG tablet Take 500 mg by mouth daily as needed (for outbreaks).    Yes [provider]    Family History Family History  Problem Relation Age of Onset  . Heart disease Mother        died of heart failure  . Heart attack Mother 2148    Social History Social History   Tobacco Use  . Smoking status: Never Smoker  Substance Use Topics  . Alcohol use: Yes  . Drug use: No     Allergies   Flagyl [metronidazole] and Tetracyclines & related   Review of Systems Review of Systems  Constitutional: Positive for activity change and diaphoresis.  Respiratory: Positive for chest tightness and shortness of breath.   Cardiovascular: Positive for chest pain.  Gastrointestinal: Negative for nausea and vomiting.  Hematological: Does not bruise/bleed easily.  All other systems reviewed and are negative.    Physical Exam Updated Vital Signs BP 132/70   Pulse (!) 55   Temp 98.4 F (36.9 C) (Oral)   Resp 15   Ht 5\' 5"  (1.651 m)    Wt 74.8 kg   SpO2 100%   BMI 27.46 kg/m   Physical Exam Vitals signs and nursing note reviewed.  Constitutional:      Appearance: She is well-developed.  HENT:     Head: Normocephalic and atraumatic.  Eyes:     Conjunctiva/sclera: Conjunctivae normal.     Pupils: Pupils are equal, round, and reactive to light.  Neck:     Musculoskeletal: Normal range of motion and neck supple.  Cardiovascular:     Rate and Rhythm: Normal rate and regular rhythm.     Heart sounds: Normal heart sounds. No murmur.  Pulmonary:     Effort:  Pulmonary effort is normal. No respiratory distress.     Breath sounds: Normal breath sounds.  Abdominal:     General: Bowel sounds are normal. There is no distension.     Palpations: Abdomen is soft.     Tenderness: There is no abdominal tenderness. There is no guarding or rebound.  Musculoskeletal:     Right lower leg: She exhibits no tenderness. No edema.     Left lower leg: She exhibits no tenderness. No edema.  Skin:    General: Skin is warm and dry.  Neurological:     Mental Status: She is alert and oriented to person, place, and time.      ED Treatments / Results  Labs (all labs ordered are listed, but only abnormal results are displayed) Labs Reviewed  BASIC METABOLIC PANEL - Abnormal; Notable for the following components:      Result Value   Sodium 134 (*)    Potassium 3.2 (*)    CO2 19 (*)    Glucose, Bld 101 (*)    BUN 28 (*)    Creatinine, Ser 2.11 (*)    GFR calc non Af Amer 25 (*)    GFR calc Af Amer 29 (*)    Anion gap 16 (*)    All other components within normal limits  CBC - Abnormal; Notable for the following components:   Hemoglobin 15.5 (*)    All other components within normal limits  TROPONIN I (HIGH SENSITIVITY) - Abnormal; Notable for the following components:   Troponin I (High Sensitivity) 32 (*)    All other components within normal limits  TROPONIN I (HIGH SENSITIVITY) - Abnormal; Notable for the following  components:   Troponin I (High Sensitivity) 29 (*)    All other components within normal limits  SARS CORONAVIRUS 2 (HOSPITAL ORDER, PERFORMED IN [facility])  TROPONIN I (HIGH SENSITIVITY)  HEMOGLOBIN A1C  HIV ANTIBODY (ROUTINE TESTING W REFLEX)  TSH  T4, FREE  BASIC METABOLIC PANEL  URINALYSIS, COMPLETE (UACMP) WITH MICROSCOPIC  I-STAT BETA HCG BLOOD, ED ([facility], [facility], [facility] ONLY)    EKG EKG Interpretation  Date/Time:  Monday January 20 2019 18:19:43 EDT Ventricular Rate:  78 PR Interval:  144 QRS Duration: 80 QT Interval:  402 QTC Calculation: 458 R Axis:   18 Text Interpretation:  Normal sinus rhythm Low voltage QRS Borderline ECG No acute changes No old tracing to compare Confirmed by Derwood KaplanNanavati, [place] [phone]([id number]) on 01/21/2019 1:05:42 AM    EKG Interpretation  Date/Time:  Tuesday January 21 2019 02:06:36 EDT Ventricular Rate:  55 PR Interval:  144 QRS Duration: 99 QT Interval:  470 QTC Calculation: 450 R Axis:   -16 Text Interpretation:  Sinus rhythm Borderline left axis deviation Low voltage, precordial leads No acute changes No significant change since last tracing Confirmed by Derwood KaplanNanavati, Nakhia Levitan [phone]([id number]) on 01/21/2019 3:29:34 AM        Radiology Dg Chest 2 View  Result Date: 01/20/2019 CLINICAL DATA:  Chest pain EXAM: CHEST - 2 VIEW COMPARISON:  None. FINDINGS: The heart size and mediastinal contours are within normal limits. Both lungs are clear. The visualized skeletal structures are unremarkable. IMPRESSION: No active cardiopulmonary disease. Electronically Signed   By: Katherine Mantlehristopher  Green M.D.   On: 01/20/2019 18:56    Procedures Procedures (including critical care time)  Medications Ordered in ED Medications  nitroGLYCERIN (NITROSTAT) SL tablet 0.4 mg (0.4 mg Sublingual Given 01/21/19 0207)  acetaminophen (TYLENOL) tablet 650 mg (has no administration  in time range)  0.9 %  sodium chloride infusion (has no administration in time range)  amLODipine (NORVASC) tablet 5  mg (has no administration in time range)  ALPRAZolam (XANAX) tablet 0.5 mg (has no administration in time range)  aspirin EC tablet 81 mg (has no administration in time range)  buPROPion (WELLBUTRIN XL) 24 hr tablet 300 mg (has no administration in time range)  vitamin B-12 (CYANOCOBALAMIN) tablet 3,000 mcg (has no administration in time range)  desvenlafaxine (PRISTIQ) 24 hr tablet 100 mg (has no administration in time range)  hydrALAZINE (APRESOLINE) tablet 25 mg (has no administration in time range)  levothyroxine (SYNTHROID) tablet 112 mcg (has no administration in time range)  pravastatin (PRAVACHOL) tablet 40 mg (has no administration in time range)  insulin aspart (novoLOG) injection 0-9 Units (has no administration in time range)  acetaminophen (TYLENOL) tablet 650 mg (has no administration in time range)  ondansetron (ZOFRAN) injection 4 mg (has no administration in time range)  enoxaparin (LOVENOX) injection 30 mg (has no administration in time range)  aspirin chewable tablet 324 mg (324 mg Oral Given 01/21/19 0124)  potassium chloride SA (K-DUR) CR tablet 40 mEq (40 mEq Oral Given 01/21/19 0124)  sodium chloride 0.9 % bolus 1,000 mL (1,000 mLs Intravenous New Bag/Given 01/21/19 0125)     Initial Impression / Assessment and Plan / ED Course  I have reviewed the triage vital signs and the nursing notes.  Pertinent labs & imaging results that were available during my care of the patient were reviewed by me and considered in my medical decision making (see chart for details).     HEAR Score: 7  58 year old comes in a chief complaint of chest pain.  She has some typical features to her chest pain, namely the pain is described as heaviness and it is radiating to her back and neck area.  She also has concerning constitutional's like associated shortness of breath and diaphoresis.  However the pain is been present now for more than 24 hours, which is not typical for ACS.  Clinically  however there is no other diagnoses that is more likely than ACS -as we considered PE, pericarditis, myocarditis, gastritis, esophagitis all in the differential diagnosis for this patient.  Given patient's elevated HEAR score with persistent chest discomfort, we will likely admit her. Of note, patient's troponin was slightly elevated at 32 and the repeat was 29.  With the troponin going down, there is a slight possibility that she might have had a small MI few days ago.  Additionally she is also noted to have elevated creatinine.  We will give her IV fluid for her AKI.  Repeat EKG is not showing any concerning findings.  She will get aspirin and nitro.  Holding heparin for now.  Final Clinical Impressions(s) / ED Diagnoses   Final diagnoses:  AKI (acute kidney injury) ([place])  Angina pectoris [facility])    ED Discharge Orders    None       Varney Biles, MD 01/21/19 [phone]

## 2019-01-21 NOTE — ED Notes (Signed)
Urine culture collected and sent to main lab. 

## 2019-01-21 NOTE — ED Notes (Signed)
Lunch Tray Ordered @ [phone].

## 2019-01-21 NOTE — ED Notes (Signed)
Attempted to call report to [phone]  

## 2019-01-21 NOTE — Progress Notes (Signed)
PROGRESS NOTE    Kathryn [name]  CNO:[id number] DOB: Nov 22, 1960 DOA: 01/20/2019 PCP: Jettie Booze, NP    Brief Narrative:  58 year old female who presented with chest pain.  She does have significant past medical history for Graves' disease, dyslipidemia.  She reported pressure-like chest pain, associated with dyspnea, worsening over 24 hours.  Apparently triggered while hiking and doing physical exertion.  On her initial physical examination blood pressure 141/87, heart rate 65, respiratory rate 18, temperature 98.4, oxygen saturation 99%.  Her lungs are clear to auscultation bilaterally, heart S1-S2 present and rhythmic, her abdomen was soft nontender, no lower extremity edema.  Sodium 134, potassium 3.2, chloride 99, bicarb 19, glucose 101, BUN 28, creatinine 2.1, white count 9.4, hemoglobin 15.5, hematocrit 43.0, platelets 358, TSH 3.8.  Troponin 32, 29, 27.  her chest radiograph was negative for infiltrates.  EKG 78 bpm, left axis deviation, normal intervals, sinus rhythm, no ST segment or T wave changes.  Patient was admitted to the hospital working diagnosis of atypical chest pain rule out acute coronary syndrome.   Assessment & Plan:   Principal Problem:   AKI (acute kidney injury) ([place]) Active Problems:   Hypercholesteremia   Graves disease   Essential hypertension   Chest pain, rule out acute myocardial infarction   Fever and chills   Thyroid nodule   1. Atypical chest pain. Patient continue to have chest pressure, troponin high sensitive flat at 32, 29, 27, non ischemic pattern, ekg with no ischemic changes. Will continue telemetry monitoring and check troponin in am. Patient low pretest probability for ACS. Recent stress test 3 year ago negative, per her report.   2. GERD with suspected gastritis. Will add bid pantoprazole and sucralfate with meals, continue as needed antiemetics therapy and will advance diet as tolerated.  3. AKI on CKD stage 3a (base cr 1,2) with  hypokalemia. Renal function with persistent elevation in serum cr at 2,31 with K at 3,5 and serum bicarbonate at 21. Will continue hydration with IV isotonic saline at 75 ml per H, will follow on panel in am, avoid hypotension and nephrotoxic medications.   4, Hypothyroid. Continue levothyroxine.  5. HTN. Continue blood pressure control with amlodipine and hydralazine.   6. Dyslipidemia. Continue pravastatin.   7. Depression/ anxiety. Continue bupropion, alprazolam, desvenlafaxine.   DVT prophylaxis: enoxaparin   Code Status:  full Family Communication: no family at the bedside Disposition Plan/ discharge barriers: pending clinical improvement.  Body mass index is 27.46 kg/m. Malnutrition Type:      Malnutrition Characteristics:      Nutrition Interventions:     RN Pressure Injury Documentation:     Consultants:     Procedures:     Antimicrobials:       Subjective: Patient continue to have indigestion and acid reflux, chest pressure, no nausea or vomiting. Is active at home with no angina symptoms, had routine stress test 3 year ago which was negative   Objective: Vitals:   01/21/19 1130 01/21/19 1145 01/21/19 1215 01/21/19 1230  BP: 137/82 (!) 138/91 136/90 140/82  Pulse: (!) 59 68 (!) 59 62  Resp: 13 14 19 18   Temp:      TempSrc:      SpO2: 96% 97% 98% 98%  Weight:      Height:       No intake or output data in the 24 hours ending 01/21/19 1300 Filed Weights   01/20/19 1818  Weight: 74.8 kg    Examination:  General: deconditioned, not in pain Neurology: Awake and alert, non focal  E ENT: mild pallor, no icterus, oral mucosa moist Cardiovascular: No JVD. S1-S2 present, rhythmic, no gallops, rubs, or murmurs. No lower extremity edema. Pulmonary: vesicular breath sounds bilaterally, adequate air movement, no wheezing, rhonchi or rales. Gastrointestinal. Abdomen mild distended, no organomegaly, non tender, no rebound or guarding Skin. No  rashes Musculoskeletal: no joint deformities     Data Reviewed: I have personally reviewed following labs and imaging studies  CBC: Recent Labs  Lab 01/20/19 1820  WBC 9.4  HGB 15.5*  HCT 43.0  MCV 85.5  PLT 358   Basic Metabolic Panel: Recent Labs  Lab 01/20/19 1820 01/21/19 0333  NA 134* 136  K 3.2* 3.5  CL 99 102  CO2 19* 21*  GLUCOSE 101* 106*  BUN 28* 34*  CREATININE 2.11* 2.31*  CALCIUM 10.0 9.0   GFR: Estimated Creatinine Clearance: 27.2 mL/min (A) (by C-G formula based on SCr of 2.31 mg/dL (H)). Liver Function Tests: No results for input(s): AST, ALT, ALKPHOS, BILITOT, PROT, ALBUMIN in the last 168 hours. No results for input(s): LIPASE, AMYLASE in the last 168 hours. No results for input(s): AMMONIA in the last 168 hours. Coagulation Profile: No results for input(s): INR, PROTIME in the last 168 hours. Cardiac Enzymes: No results for input(s): CKTOTAL, CKMB, CKMBINDEX, TROPONINI in the last 168 hours. BNP (last 3 results) No results for input(s): PROBNP in the last 8760 hours. HbA1C: Recent Labs    01/21/19 0333  HGBA1C 5.8*   CBG: Recent Labs  Lab 01/21/19 0437 01/21/19 0741 01/21/19 1203  GLUCAP 103* 112* 102*   Lipid Profile: No results for input(s): CHOL, HDL, LDLCALC, TRIG, CHOLHDL, LDLDIRECT in the last 72 hours. Thyroid Function Tests: Recent Labs    01/21/19 0333  TSH 3.858  FREET4 1.51*   Anemia Panel: No results for input(s): VITAMINB12, FOLATE, FERRITIN, TIBC, IRON, RETICCTPCT in the last 72 hours.    Radiology Studies: I have reviewed all of the imaging during this hospital visit personally     Scheduled Meds: . amLODipine  5 mg Oral Daily  . aspirin EC  81 mg Oral Daily  . buPROPion  300 mg Oral Daily  . desvenlafaxine  100 mg Oral Daily  . enoxaparin (LOVENOX) injection  30 mg Subcutaneous Daily  . hydrALAZINE  25 mg Oral QHS  . insulin aspart  0-9 Units Subcutaneous Q4H  . levothyroxine  112 mcg Oral QAC  breakfast  . pravastatin  40 mg Oral q1800  . cyanocobalamin  3,000 mcg Oral Daily   Continuous Infusions: . sodium chloride 125 mL/hr at 01/21/19 1207     LOS: 0 days        Glyn Gerads Annett Gulaaniel Abdi Husak, MD

## 2019-01-22 ENCOUNTER — Encounter (HOSPITAL_COMMUNITY): Payer: Self-pay | Admitting: Internal Medicine

## 2019-01-22 ENCOUNTER — Ambulatory Visit: Payer: PRIVATE HEALTH INSURANCE | Admitting: Psychology

## 2019-01-22 ENCOUNTER — Inpatient Hospital Stay (HOSPITAL_COMMUNITY): Payer: PRIVATE HEALTH INSURANCE

## 2019-01-22 DIAGNOSIS — K219 Gastro-esophageal reflux disease without esophagitis: Secondary | ICD-10-CM | POA: Diagnosis present

## 2019-01-22 DIAGNOSIS — E876 Hypokalemia: Secondary | ICD-10-CM | POA: Diagnosis present

## 2019-01-22 DIAGNOSIS — Z1159 Encounter for screening for other viral diseases: Secondary | ICD-10-CM | POA: Diagnosis not present

## 2019-01-22 DIAGNOSIS — N179 Acute kidney failure, unspecified: Secondary | ICD-10-CM | POA: Diagnosis present

## 2019-01-22 DIAGNOSIS — I1310 Hypertensive heart and chronic kidney disease without heart failure, with stage 1 through stage 4 chronic kidney disease, or unspecified chronic kidney disease: Secondary | ICD-10-CM | POA: Diagnosis present

## 2019-01-22 DIAGNOSIS — Z7982 Long term (current) use of aspirin: Secondary | ICD-10-CM | POA: Diagnosis not present

## 2019-01-22 DIAGNOSIS — Z883 Allergy status to other anti-infective agents status: Secondary | ICD-10-CM | POA: Diagnosis not present

## 2019-01-22 DIAGNOSIS — E7800 Pure hypercholesterolemia, unspecified: Secondary | ICD-10-CM | POA: Diagnosis present

## 2019-01-22 DIAGNOSIS — R079 Chest pain, unspecified: Secondary | ICD-10-CM

## 2019-01-22 DIAGNOSIS — K2970 Gastritis, unspecified, without bleeding: Secondary | ICD-10-CM | POA: Diagnosis present

## 2019-01-22 DIAGNOSIS — N183 Chronic kidney disease, stage 3 (moderate): Secondary | ICD-10-CM | POA: Diagnosis present

## 2019-01-22 DIAGNOSIS — Z8249 Family history of ischemic heart disease and other diseases of the circulatory system: Secondary | ICD-10-CM | POA: Diagnosis not present

## 2019-01-22 DIAGNOSIS — Z7984 Long term (current) use of oral hypoglycemic drugs: Secondary | ICD-10-CM | POA: Diagnosis not present

## 2019-01-22 DIAGNOSIS — E785 Hyperlipidemia, unspecified: Secondary | ICD-10-CM | POA: Diagnosis present

## 2019-01-22 DIAGNOSIS — Z79890 Hormone replacement therapy (postmenopausal): Secondary | ICD-10-CM | POA: Diagnosis not present

## 2019-01-22 DIAGNOSIS — F419 Anxiety disorder, unspecified: Secondary | ICD-10-CM | POA: Diagnosis present

## 2019-01-22 DIAGNOSIS — E860 Dehydration: Secondary | ICD-10-CM | POA: Diagnosis present

## 2019-01-22 DIAGNOSIS — E039 Hypothyroidism, unspecified: Secondary | ICD-10-CM | POA: Diagnosis present

## 2019-01-22 DIAGNOSIS — E1122 Type 2 diabetes mellitus with diabetic chronic kidney disease: Secondary | ICD-10-CM | POA: Diagnosis present

## 2019-01-22 LAB — BASIC METABOLIC PANEL
Anion gap: 11 (ref 5–15)
BUN: 29 mg/dL — ABNORMAL HIGH (ref 6–20)
CO2: 24 mmol/L (ref 22–32)
Calcium: 8.5 mg/dL — ABNORMAL LOW (ref 8.9–10.3)
Chloride: 103 mmol/L (ref 98–111)
Creatinine, Ser: 2.14 mg/dL — ABNORMAL HIGH (ref 0.44–1.00)
GFR calc Af Amer: 29 mL/min — ABNORMAL LOW (ref >60)
GFR calc non Af Amer: 25 mL/min — ABNORMAL LOW (ref >60)
Glucose, Bld: 120 mg/dL — ABNORMAL HIGH (ref 70–99)
Potassium: 3.1 mmol/L — ABNORMAL LOW (ref 3.5–5.1)
Sodium: 138 mmol/L (ref 135–145)

## 2019-01-22 LAB — TROPONIN I (HIGH SENSITIVITY): Troponin I (High Sensitivity): 37 ng/L — ABNORMAL HIGH (ref <18)

## 2019-01-22 LAB — ECHOCARDIOGRAM COMPLETE
Height: 65 in
Weight: 2670.4 oz

## 2019-01-22 LAB — GLUCOSE, CAPILLARY: Glucose-Capillary: 119 mg/dL — ABNORMAL HIGH (ref 70–99)

## 2019-01-22 MED ORDER — POTASSIUM CHLORIDE CRYS ER 20 MEQ PO TBCR
40.0000 meq | EXTENDED_RELEASE_TABLET | Freq: Once | ORAL | Status: AC
  Administered 2019-01-22: 12:00:00 40 meq via ORAL
  Filled 2019-01-22: qty 2

## 2019-01-22 MED ORDER — SODIUM CHLORIDE 0.9 % IV BOLUS: 500.0000 mL | Freq: Once | INTRAVENOUS | Status: DC

## 2019-01-22 NOTE — Plan of Care (Signed)
Patient has no C/O chest pain or discomfort at this time, will continue to monitor.

## 2019-01-22 NOTE — Progress Notes (Signed)
[facility] PROGRESS NOTE  SCOTTIE STANISH WUJ:[id number] DOB: April 12, 1961 DOA: 01/20/2019 PCP: Jettie Booze, NP  Assessment/Plan:  1. Atypical chest pain.  Resolved this am. Described as chest pressure, troponin high sensitive flat at 32, 29, 27, non ischemic pattern, ekg with no ischemic changes. No events on telemetry. Recent stress test 3 year ago negative, per her report.  -obtain echo -supportive therapy  2. GERD with suspected gastritis. Resolved this am.  -PPI  3. AKI on CKD stage 3a (base cr 1,2) with hypokalemia. Renal function with persistent elevation in serum cr at 2,31 with K at 3,5 and serum bicarbonate at 21. Creatinine trended up to 2.3. patient reports hiking 2 days ago without water -bolus  500cc now -continue IV fluids -hold nephrotoxins -monitor urine output -recheck in am -if no improvement tomorrow consider renal US  4, Hypothyroid. Continue levothyroxine.  5. HTN. BP high end of normal. Home med include amlodipine, hydralazine, losartan and maxide. -Continue  amlodipine and hydralazine -hold maxide and losartan secondary #3 -monitor   6. Dyslipidemia. Continue pravastatin.   7. Depression/ anxiety.  Stable at baseline. Continue bupropion, alprazolam, desvenlafaxine.   #8. Diabetes. Home meds include metformin. HgA1c 5.8. does not monitor cbg -hold metformin -heart healthy diet  #9. Hypokalemia. Likely related to dehydration in setting of HCTZ -replete -recheck   Code Status: full Family Communication: patient Disposition Plan: home tomorrow   Consultants:    Procedures:  echo  Antibiotics:    HPI/Subjective: 58 yo hx htn, diabetes, anxiety admitted for cp rule out. Found to have aki. Likely related to dehydration in setting of arb, hctz. Ruled out. Echo pending. Iv fluids.   Objective: Vitals:   01/22/19 0542 01/22/19 0821  BP: 131/81 (!) 145/89  Pulse: (!) 58   Resp:    Temp: 98 F (36.7 C)   SpO2: 98%      Intake/Output Summary (Last 24 hours) at 01/22/2019 1152 Last data filed at 01/22/2019 0600 Gross per 24 hour  Intake 3319.59 ml  Output 1400 ml  Net 1919.59 ml   Filed Weights   01/20/19 1818 01/21/19 1606 01/22/19 0542  Weight: 74.8 kg 75.5 kg 75.7 kg    Exam:   General:  Awake alert no acute distress  Cardiovascular: rrr no mgr no LE edema  Respiratory: normal effort BS clear bilaterally no wheeze  Abdomen: non-distended non-tender +BS no guarding or rebounding  Musculoskeletal: joints without swelling /erythema   Data Reviewed: Basic Metabolic Panel: Recent Labs  Lab 01/20/19 1820 01/21/19 0333 01/22/19 0654  NA 134* 136 138  K 3.2* 3.5 3.1*  CL 99 102 103  CO2 19* 21* 24  GLUCOSE 101* 106* 120*  BUN 28* 34* 29*  CREATININE 2.11* 2.31* 2.14*  CALCIUM 10.0 9.0 8.5*   Liver Function Tests: No results for input(s): AST, ALT, ALKPHOS, BILITOT, PROT, ALBUMIN in the last 168 hours. No results for input(s): LIPASE, AMYLASE in the last 168 hours. No results for input(s): AMMONIA in the last 168 hours. CBC: Recent Labs  Lab 01/20/19 1820  WBC 9.4  HGB 15.5*  HCT 43.0  MCV 85.5  PLT 358   Cardiac Enzymes: No results for input(s): CKTOTAL, CKMB, CKMBINDEX, TROPONINI in the last 168 hours. BNP (last 3 results) No results for input(s): BNP in the last 8760 hours.  ProBNP (last 3 results) No results for input(s): PROBNP in the last 8760 hours.  CBG: Recent Labs  Lab 01/21/19 0437 01/21/19 0741 01/21/19 1203 01/21/19 1645  01/21/19 2144  GLUCAP 103* 112* 102* 113* 123*    Recent Results (from the past 240 hour(s))  SARS Coronavirus 2 (CEPHEID - Performed in [facility] hospital lab), Hosp Order     Status: None   Collection Time: 01/21/19  3:03 AM   Specimen: Nasopharyngeal Swab  Result Value Ref Range Status   SARS Coronavirus 2 NEGATIVE NEGATIVE Final    Comment: (NOTE) If result is NEGATIVE SARS-CoV-2 target nucleic acids are NOT  DETECTED. The SARS-CoV-2 RNA is generally detectable in upper and lower  respiratory specimens during the acute phase of infection. The lowest  concentration of SARS-CoV-2 viral copies this assay can detect is 250  copies / mL. A negative result does not preclude SARS-CoV-2 infection  and should not be used as the sole basis for treatment or other  patient management decisions.  A negative result may occur with  improper specimen collection / handling, submission of specimen other  than nasopharyngeal swab, presence of viral mutation(s) within the  areas targeted by this assay, and inadequate number of viral copies  (<250 copies / mL). A negative result must be combined with clinical  observations, patient history, and epidemiological information. If result is POSITIVE SARS-CoV-2 target nucleic acids are DETECTED. The SARS-CoV-2 RNA is generally detectable in upper and lower  respiratory specimens dur ing the acute phase of infection.  Positive  results are indicative of active infection with SARS-CoV-2.  Clinical  correlation with patient history and other diagnostic information is  necessary to determine patient infection status.  Positive results do  not rule out bacterial infection or co-infection with other viruses. If result is PRESUMPTIVE POSTIVE SARS-CoV-2 nucleic acids MAY BE PRESENT.   A presumptive positive result was obtained on the submitted specimen  and confirmed on repeat testing.  While 2019 novel coronavirus  (SARS-CoV-2) nucleic acids may be present in the submitted sample  additional confirmatory testing may be necessary for epidemiological  and / or clinical management purposes  to differentiate between  SARS-CoV-2 and other Sarbecovirus currently known to infect humans.  If clinically indicated additional testing with an alternate test  methodology [phone]([id number]) is advised. The SARS-CoV-2 RNA is generally  detectable in upper and lower respiratory sp ecimens during  the acute  phase of infection. The expected result is Negative. Fact Sheet for Patients:  BoilerBrush.com.cyhttps://www.fda.gov/media/136312/download Fact Sheet for Healthcare Providers: https://pope.com/https://www.fda.gov/media/136313/download This test is not yet approved or cleared by the [place] FDA and has been authorized for detection and/or diagnosis of SARS-CoV-2 by FDA under an Emergency Use Authorization (EUA).  This EUA will remain in effect (meaning this test can be used) for the duration of the COVID-19 declaration under Section 564(b)(1) of the Act, 21 U.S.C. section 360bbb-3(b)(1), unless the authorization is terminated or revoked sooner. Performed at [facility], [address] [place], [place] [address]      Studies: Dg Chest 2 View  Result Date: 01/20/2019 CLINICAL DATA:  Chest pain EXAM: CHEST - 2 VIEW COMPARISON:  None. FINDINGS: The heart size and mediastinal contours are within normal limits. Both lungs are clear. The visualized skeletal structures are unremarkable. IMPRESSION: No active cardiopulmonary disease. Electronically Signed   By: Katherine Mantlehristopher  Green M.D.   On: 01/20/2019 18:56    Scheduled Meds: . amLODipine  5 mg Oral Daily  . aspirin EC  81 mg Oral Daily  . buPROPion  300 mg Oral Daily  . desvenlafaxine  100 mg Oral Daily  . enoxaparin (LOVENOX) injection  30 mg Subcutaneous Daily  . hydrALAZINE  25 mg Oral QHS  . levothyroxine  112 mcg Oral QAC breakfast  . pantoprazole  40 mg Oral Daily  . potassium chloride  40 mEq Oral Once  . pravastatin  40 mg Oral q1800  . sucralfate  1 g Oral TID WC & HS  . cyanocobalamin  3,000 mcg Oral Daily   Continuous Infusions: . sodium chloride Stopped (01/22/19 0906)  . sodium chloride 500 mL/hr at 01/22/19 0907    Principal Problem:   AKI (acute kidney injury) [facility](HCC) Active Problems:   Chest pain, rule out acute myocardial infarction   Hypercholesteremia   Graves disease   Essential hypertension   Thyroid  nodule    Time spent: 45 minutes    Gwenyth BenderBLACK,KAREN M NP  [facility]  If 7PM-7AM, please contact night-coverage at [email or web address], password [email or web address] 01/22/2019, 11:52 AM  LOS: 0 days

## 2019-01-23 DIAGNOSIS — I119 Hypertensive heart disease without heart failure: Secondary | ICD-10-CM | POA: Diagnosis present

## 2019-01-23 LAB — CBC
HCT: 34.7 % — ABNORMAL LOW (ref 36.0–46.0)
Hemoglobin: 12.3 g/dL (ref 12.0–15.0)
MCH: 31.1 pg (ref 26.0–34.0)
MCHC: 35.4 g/dL (ref 30.0–36.0)
MCV: 87.8 fL (ref 80.0–100.0)
Platelets: 211 10*3/uL (ref 150–400)
RBC: 3.95 MIL/uL (ref 3.87–5.11)
RDW: 11.5 % (ref 11.5–15.5)
WBC: 6 10*3/uL (ref 4.0–10.5)
nRBC: 0 % (ref 0.0–0.2)

## 2019-01-23 LAB — BASIC METABOLIC PANEL
Anion gap: 9 (ref 5–15)
BUN: 21 mg/dL — ABNORMAL HIGH (ref 6–20)
CO2: 25 mmol/L (ref 22–32)
Calcium: 8.2 mg/dL — ABNORMAL LOW (ref 8.9–10.3)
Chloride: 107 mmol/L (ref 98–111)
Creatinine, Ser: 1.8 mg/dL — ABNORMAL HIGH (ref 0.44–1.00)
GFR calc Af Amer: 36 mL/min — ABNORMAL LOW (ref >60)
GFR calc non Af Amer: 31 mL/min — ABNORMAL LOW (ref >60)
Glucose, Bld: 97 mg/dL (ref 70–99)
Potassium: 3.2 mmol/L — ABNORMAL LOW (ref 3.5–5.1)
Sodium: 141 mmol/L (ref 135–145)

## 2019-01-23 LAB — GLUCOSE, CAPILLARY: Glucose-Capillary: 102 mg/dL — ABNORMAL HIGH (ref 70–99)

## 2019-01-23 MED ORDER — LOSARTAN POTASSIUM 100 MG PO TABS: 100.0000 mg | ORAL_TABLET | ORAL | Status: DC

## 2019-01-23 MED ORDER — TRIAMTERENE-HCTZ 75-50 MG PO TABS: 1.0000 | ORAL_TABLET | Freq: Every day | ORAL | Status: AC

## 2019-01-23 MED ORDER — POTASSIUM CHLORIDE CRYS ER 20 MEQ PO TBCR
40.0000 meq | EXTENDED_RELEASE_TABLET | Freq: Once | ORAL | Status: AC
  Administered 2019-01-23: 40 meq via ORAL
  Filled 2019-01-23: qty 2

## 2019-01-23 MED ORDER — ENOXAPARIN SODIUM 40 MG/0.4ML ~~LOC~~ SOLN: 40.0000 mg | Freq: Every day | SUBCUTANEOUS | Status: DC

## 2019-01-23 MED ORDER — TRIAMTERENE-HCTZ 75-50 MG PO TABS: 1.0000 | ORAL_TABLET | Freq: Every day | ORAL | 3 refills | Status: DC

## 2019-01-23 NOTE — Discharge Summary (Signed)
Physician Discharge Summary  Kathryn [name] ALP:[id number] DOB: 03/23/1961 DOA: 01/20/2019  PCP: Jettie Booze, NP  Admit date: 01/20/2019 Discharge date: 01/23/2019  Time spent: 45 minutes  Recommendations for Outpatient Follow-up:  1. Follow up with cardiology 1-2 weeks evaluate symptoms. May benefit OP stress test 2. Follow up PCP 1-2 weeks for evaluation of BP control and kidney function as well as diabetes control and need for metformin.  Recommend bmet to evaluate kidney function 3. Hold maxide and losartan for 1 week    Discharge Diagnoses:  Principal Problem:   AKI (acute kidney injury) ([place]) Active Problems:   Chest pain, rule out acute myocardial infarction   Hypercholesteremia   Graves disease   Essential hypertension   Hypokalemia   Thyroid nodule   Chest pain   LVH (left ventricular hypertrophy) due to hypertensive disease   Discharge Condition: stable  Diet recommendation: heart healthy carb modified  Filed Weights   01/21/19 1606 01/22/19 0542 01/23/19 0644  Weight: 75.5 kg 75.7 kg 76.7 kg    History of present illness:  Kathryn [name] is a 58 y.o. female with medical history significant of Graves disease, HLD.  Patient presented to ED 01/21/19 with c/o chest pain.  Pressure like sensation, associated with SOB, worsening throughout day yesterday.  Symptoms onset [place].  Associated chills, and ? Of fever.  Associated diaphoresis.    Hospital Course:  1. Atypical chest pain.  Resolved quickly. Described as chest pressure, troponin high sensitive flat at 32, 29, 27, ekg with no ischemic changes. No events on telemetry. Recent stress test 3 year ago negative, per her report. Likely GI related. Echo with EF >65%, moderate basal septal hypertrophy. Follow up with primary cardiology 1-2 weeks for evaluation of symptoms  2. GERD with suspected gastritis. Resolved   3. AKI on CKD stage 3a (base cr 1,2) with hypokalemia. Renal function  with persistent elevation in serum cr at 2,31 with K at 3,5 and serum bicarbonate at 21. likely related to dehydration in setting of ARB and HCTZ. Provided with IV fluids, nephrotoxins held and creatinine 1.8 on discharge. Recommend holding ARB and HCTZ 1 week with close OP follow up with PCP  4, Hypothyroid. TSH 3.8 Continue levothyroxine.  5. HTN. BP high end of normal. Home med include amlodipine, hydralazine, losartan and maxide. Maxide and losartan held during hospitalization. Recommend holding for 1 more week and following up with PCP 1 week for evaluation of BP control and kidney function.    6. Dyslipidemia. Continue pravastatin.   7. Depression/ anxiety.  Stable at baseline. Continue bupropion, alprazolam, desvenlafaxine.  #8. Diabetes. Home meds include metformin. HgA1c 5.8. does not monitor cbg  #9. Hypokalemia. Likely related to dehydration in setting of HCTZ -repleted and resolved.   Procedures:echo Consultations:    Discharge Exam: Vitals:   01/23/19 0644 01/23/19 0834  BP: 136/87 (!) 142/84  Pulse: (!) 59   Resp: 18   Temp: 98.1 F (36.7 C)   SpO2: 98%     General: awake alert no acute distress Cardiovascular: rrr no mgr no LE edema Respiratory: normal effort BS clear bilaterally  Discharge Instructions   Discharge Instructions    Call MD for:  extreme fatigue   Complete by: As directed    Call MD for:  persistant dizziness or light-headedness   Complete by: As directed    Diet - low sodium heart healthy   Complete by: As directed    Discharge  instructions   Complete by: As directed    Hold losartin and maxide for 1 week before resuming Follow up with PCP 1 week for bmet to track kidney function Monitor BP daily. Take BP at same time every day. Document BP daily. Share results with PCP at follow up visit   Increase activity slowly   Complete by: As directed       Allergies  Allergen Reactions  . Flagyl [Metronidazole] Nausea Only     Nausea and Headache  . Tetracyclines & Related Nausea And Vomiting   Follow-up Information    April MansonWhite, Marsha L, NP. Schedule an appointment as soon as possible for a visit in 1 week(s).   Specialty: Family Medicine Why: with labs Contact information: [address] [place] [address] [phone]        Jake BatheSkains, Mark C, MD. Schedule an appointment as soon as possible for a visit in 1 month(s).   Specialty: Cardiology Contact information: [address] [place] [address] [phone]            The results of significant diagnostics from this hospitalization (including imaging, microbiology, ancillary and laboratory) are listed below for reference.    Significant Diagnostic Studies: Dg Chest 2 View  Result Date: 01/20/2019 CLINICAL DATA:  Chest pain EXAM: CHEST - 2 VIEW COMPARISON:  None. FINDINGS: The heart size and mediastinal contours are within normal limits. Both lungs are clear. The visualized skeletal structures are unremarkable. IMPRESSION: No active cardiopulmonary disease. Electronically Signed   By: Katherine Mantlehristopher  Green M.D.   On: 01/20/2019 18:56    Microbiology: Recent Results (from the past 240 hour(s))  SARS Coronavirus 2 (CEPHEID - Performed in [facility] hospital lab), Hosp Order     Status: None   Collection Time: 01/21/19  3:03 AM   Specimen: Nasopharyngeal Swab  Result Value Ref Range Status   SARS Coronavirus 2 NEGATIVE NEGATIVE Final    Comment: (NOTE) If result is NEGATIVE SARS-CoV-2 target nucleic acids are NOT DETECTED. The SARS-CoV-2 RNA is generally detectable in upper and lower  respiratory specimens during the acute phase of infection. The lowest  concentration of SARS-CoV-2 viral copies this assay can detect is 250  copies / mL. A negative result does not preclude SARS-CoV-2 infection  and should not be used as the sole basis for treatment or other  patient management decisions.  A negative result may occur with   improper specimen collection / handling, submission of specimen other  than nasopharyngeal swab, presence of viral mutation(s) within the  areas targeted by this assay, and inadequate number of viral copies  (<250 copies / mL). A negative result must be combined with clinical  observations, patient history, and epidemiological information. If result is POSITIVE SARS-CoV-2 target nucleic acids are DETECTED. The SARS-CoV-2 RNA is generally detectable in upper and lower  respiratory specimens dur ing the acute phase of infection.  Positive  results are indicative of active infection with SARS-CoV-2.  Clinical  correlation with patient history and other diagnostic information is  necessary to determine patient infection status.  Positive results do  not rule out bacterial infection or co-infection with other viruses. If result is PRESUMPTIVE POSTIVE SARS-CoV-2 nucleic acids MAY BE PRESENT.   A presumptive positive result was obtained on the submitted specimen  and confirmed on repeat testing.  While 2019 novel coronavirus  (SARS-CoV-2) nucleic acids may be present in the submitted sample  additional confirmatory testing may be necessary for  epidemiological  and / or clinical management purposes  to differentiate between  SARS-CoV-2 and other Sarbecovirus currently known to infect humans.  If clinically indicated additional testing with an alternate test  methodology [phone]) is advised. The SARS-CoV-2 RNA is generally  detectable in upper and lower respiratory sp ecimens during the acute  phase of infection. The expected result is Negative. Fact Sheet for Patients:  BoilerBrush.com.cyhttps://www.fda.gov/media/136312/download Fact Sheet for Healthcare Providers: https://pope.com/https://www.fda.gov/media/136313/download This test is not yet approved or cleared by the [place] FDA and has been authorized for detection and/or diagnosis of SARS-CoV-2 by FDA under an Emergency Use Authorization (EUA).  This EUA will  remain in effect (meaning this test can be used) for the duration of the COVID-19 declaration under Section 564(b)(1) of the Act, 21 U.S.C. section 360bbb-3(b)(1), unless the authorization is terminated or revoked sooner. Performed at [facility], [address] [place], [place] [address]      Labs: Basic Metabolic Panel: Recent Labs  Lab 01/20/19 1820 01/21/19 0333 01/22/19 0654 01/23/19 0428  NA 134* 136 138 141  K 3.2* 3.5 3.1* 3.2*  CL 99 102 103 107  CO2 19* 21* 24 25  GLUCOSE 101* 106* 120* 97  BUN 28* 34* 29* 21*  CREATININE 2.11* 2.31* 2.14* 1.80*  CALCIUM 10.0 9.0 8.5* 8.2*   Liver Function Tests: No results for input(s): AST, ALT, ALKPHOS, BILITOT, PROT, ALBUMIN in the last 168 hours. No results for input(s): LIPASE, AMYLASE in the last 168 hours. No results for input(s): AMMONIA in the last 168 hours. CBC: Recent Labs  Lab 01/20/19 1820 01/23/19 0428  WBC 9.4 6.0  HGB 15.5* 12.3  HCT 43.0 34.7*  MCV 85.5 87.8  PLT 358 211   Cardiac Enzymes: No results for input(s): CKTOTAL, CKMB, CKMBINDEX, TROPONINI in the last 168 hours. BNP: BNP (last 3 results) No results for input(s): BNP in the last 8760 hours.  ProBNP (last 3 results) No results for input(s): PROBNP in the last 8760 hours.  CBG: Recent Labs  Lab 01/21/19 1203 01/21/19 1645 01/21/19 2144 01/22/19 2307 01/23/19 0723  GLUCAP 102* 113* 123* 119* 102*       Signed:  Gwenyth BenderBLACK,Cecila Satcher M NP [facility] 01/23/2019, 11:46 AM

## 2019-02-05 ENCOUNTER — Ambulatory Visit: Payer: PRIVATE HEALTH INSURANCE | Admitting: Psychology

## 2019-02-12 ENCOUNTER — Ambulatory Visit (INDEPENDENT_AMBULATORY_CARE_PROVIDER_SITE_OTHER): Payer: PRIVATE HEALTH INSURANCE | Admitting: Psychology

## 2019-02-12 DIAGNOSIS — F331 Major depressive disorder, recurrent, moderate: Secondary | ICD-10-CM

## 2019-03-06 ENCOUNTER — Ambulatory Visit: Payer: PRIVATE HEALTH INSURANCE | Admitting: Psychology

## 2019-03-12 ENCOUNTER — Other Ambulatory Visit (HOSPITAL_COMMUNITY): Payer: Self-pay | Admitting: Nephrology

## 2019-03-12 DIAGNOSIS — I10 Essential (primary) hypertension: Secondary | ICD-10-CM

## 2019-03-14 NOTE — Progress Notes (Signed)
Referring-Karen Black NP Reason for referral-hypertension and chest pain  HPI: 58 year old female for evaluation of hypertension and chest pain at request of Toya SmothersKaren Black NP.  Previously seen by Dr. Anne FuSkains but not since May 2017.  Carotid Dopplers November 2015 showed 1 to 39% bilateral stenosis.  Nuclear stress test 2017 showed no ischemia and normal LV function.  Echocardiogram July 2020 showed normal LV function.  Chest x-ray July 2020 showed no active cardiopulmonary disease.  Patient does have family history of coronary disease with both mother and brother with myocardial infarction in their 440s.  Patient was hospitalized in July following admission for chest pain and acute kidney injury with creatinine of 2.3.  Maxide and losartan were discontinued.  Troponins were not consistent with acute coronary syndrome.  Asked to follow-up with cardiology following discharge.  Patient states that on the day she was admitted she was climbing a hill and had significant dyspnea.  She also developed weakness.  After returning down she developed chest heaviness that did not radiate.  It was not pleuritic, positional or related to food.  She had some nausea and dyspnea.  Pain persisted for 48 hours and resolved.  [facility] as outlined above and following discharge she was asked to follow-up with cardiology.  Current Outpatient Medications  Medication Sig Dispense Refill  . ADDERALL XR 30 MG 24 hr capsule Take 30 mg by mouth daily.    Marland Kitchen. ALPRAZolam (XANAX) 0.5 MG tablet Take 0.5 mg by mouth at bedtime as needed for anxiety.    Marland Kitchen. amLODipine (NORVASC) 5 MG tablet TAKE 1 TABLET BY MOUTH  DAILY (Patient taking differently: Take 5 mg by mouth daily. ) 45 tablet 0  . aspirin 81 MG tablet Take 81 mg by mouth daily.    Marland Kitchen. BIOTIN 5000 PO Take 5,000 mg by mouth at bedtime. 5OOO MCG BID     . buPROPion (WELLBUTRIN XL) 300 MG 24 hr tablet Take 300 mg by mouth every morning.    . cyanocobalamin 1000 MCG  tablet Take 3,000 mcg by mouth daily.     Marland Kitchen. desvenlafaxine (PRISTIQ) 100 MG 24 hr tablet Take 100 mg by mouth every morning.    . Ginger, Zingiber officinalis, (GINGER PO) Take 12 mg by mouth every morning.    . hydrALAZINE (APRESOLINE) 25 MG tablet Take 1 tablet (25 mg total) by mouth 2 (two) times daily. (Patient taking differently: Take 25 mg by mouth 2 (two) times daily. ) 180 tablet 3  . levothyroxine (SYNTHROID, LEVOTHROID) 100 MCG tablet Take 112 mcg by mouth daily before breakfast.     . lovastatin (MEVACOR) 40 MG tablet Take 40 mg by mouth at bedtime.    . Melatonin 5 MG TABS Take 10 mg by mouth at bedtime as needed (for sleep).     . Potassium Chloride ER 20 MEQ TBCR TAKE 1 TABLET BY MOUTH TWO  TIMES DAILY (Patient taking differently: Take 20 mEq by mouth daily as needed (low potassium). ) 30 tablet 0  . triamterene-hydrochlorothiazide (MAXZIDE) 75-50 MG tablet Take 1 tablet by mouth daily. Restart this after FU with PCP    . TURMERIC PO Take 270 mg by mouth every morning.    . valACYclovir (VALTREX) 500 MG tablet Take 500 mg by mouth daily as needed (for outbreaks).      No current facility-administered medications for this visit.     Allergies  Allergen Reactions  . Flagyl [Metronidazole] Nausea Only  Nausea and Headache  . Tetracyclines & Related Nausea And Vomiting     Past Medical History:  Diagnosis Date  . AKI (acute kidney injury) (HCC) 01/21/2019  . Anxiety   . GERD (gastroesophageal reflux disease)   . Graves disease    with hyperthyroidism diagnosed at age 75 (treated with propylthiouracil for 2 years)  . Hyperlipidemia   . Hypertension   . Hypothyroidism   . Sleep disturbance     Past Surgical History:  Procedure Laterality Date  . CARPAL TUNNEL RELEASE    . CESAREAN SECTION    . Rt ovary and fallopian tube removed due to cysts      Social History   Socioeconomic History  . Marital status: Married    Spouse name: Not on file  . Number of  children: 1  . Years of education: Not on file  . Highest education level: Not on file  Occupational History  . Not on file  Social Needs  . Financial resource strain: Not on file  . Food insecurity    Worry: Not on file    Inability: Not on file  . Transportation needs    Medical: Not on file    Non-medical: Not on file  Tobacco Use  . Smoking status: Never Smoker  . Smokeless tobacco: Never Used  Substance and Sexual Activity  . Alcohol use: Yes  . Drug use: No  . Sexual activity: Not on file  Lifestyle  . Physical activity    Days per week: Not on file    Minutes per session: Not on file  . Stress: Not on file  Relationships  . Social Musician on phone: Not on file    Gets together: Not on file    Attends religious service: Not on file    Active member of club or organization: Not on file    Attends meetings of clubs or organizations: Not on file    Relationship status: Not on file  . Intimate partner violence    Fear of current or ex partner: Not on file    Emotionally abused: Not on file    Physically abused: Not on file    Forced sexual activity: Not on file  Other Topics Concern  . Not on file  Social History Narrative  . Not on file    Family History  Problem Relation Age of Onset  . Heart disease Mother        died of heart failure  . Heart attack Mother 33  . CAD Brother     ROS: no fevers or chills, productive cough, hemoptysis, dysphasia, odynophagia, melena, hematochezia, dysuria, hematuria, rash, seizure activity, orthopnea, PND, pedal edema, claudication. Remaining systems are negative.  Physical Exam:   Blood pressure 138/82, pulse 82, temperature (!) 97.5 F (36.4 C), height 5\' 5"  (1.651 m), weight 173 lb (78.5 kg).  General:  Well developed/well nourished in NAD Skin warm/dry Patient not depressed No peripheral clubbing Back-normal HEENT-normal/normal eyelids Neck supple/normal carotid upstroke bilaterally; no bruits; no  JVD; no thyromegaly chest - CTA/ normal expansion CV - RRR/normal S1 and S2; no murmurs, rubs or gallops;  PMI nondisplaced Abdomen -NT/ND, no HSM, no mass, + bowel sounds, no bruit 2+ femoral pulses, no bruits Ext-no edema, chords, 2+ DP Neuro-grossly nonfocal  ECG -January 20, 2019-sinus rhythm, prolonged QT.  Personally reviewed  A/P  1 chest pain-symptoms atypical.  Troponins not consistent with acute coronary syndrome.  We will arrange a  stress nuclear study for risk stratification.  2 hypertension-blood pressure is controlled.  Continue present medications and follow.  3 hyperlipidemia-continue statin.  Followed by primary care.  4 renal insufficiency-follow-up nephrology.  Kirk Ruths, MD

## 2019-03-17 ENCOUNTER — Encounter: Payer: Self-pay | Admitting: Cardiology

## 2019-03-17 ENCOUNTER — Ambulatory Visit (INDEPENDENT_AMBULATORY_CARE_PROVIDER_SITE_OTHER): Payer: PRIVATE HEALTH INSURANCE | Admitting: Cardiology

## 2019-03-17 ENCOUNTER — Other Ambulatory Visit: Payer: Self-pay

## 2019-03-17 VITALS — BP 138/82 | HR 82 | Temp 97.5°F | Ht 65.0 in | Wt 173.0 lb

## 2019-03-17 DIAGNOSIS — R072 Precordial pain: Secondary | ICD-10-CM

## 2019-03-17 DIAGNOSIS — I10 Essential (primary) hypertension: Secondary | ICD-10-CM

## 2019-03-17 DIAGNOSIS — E7800 Pure hypercholesterolemia, unspecified: Secondary | ICD-10-CM

## 2019-03-17 NOTE — Patient Instructions (Signed)
Medication Instructions:  NO CHANGE If you need a refill on your cardiac medications before your next appointment, please call your pharmacy.   Lab work: If you have labs (blood work) drawn today and your tests are completely normal, you will receive your results only by: Marland Kitchen MyChart Message (if you have MyChart) OR . A paper copy in the mail If you have any lab test that is abnormal or we need to change your treatment, we will call you to review the results.  Testing/Procedures: Your physician has requested that you have a lexiscan myoview. For further information please visit [email or web address]. Please follow instruction sheet, as given.[place]    Follow-Up: At [facility], you and your health needs are our priority.  As part of our continuing mission to provide you with exceptional heart care, we have created designated Provider Care Teams.  These Care Teams include your primary Cardiologist (physician) and Advanced Practice Providers (APPs -  Physician Assistants and Nurse Practitioners) who all work together to provide you with the care you need, when you need it. You will need a follow up appointment in 12 months.  Please call our office 2 months in advance to schedule this appointment.  You may see Kirk Ruths MD or one of the following Advanced Practice Providers on your designated Care Team:   Kerin Ransom, PA-C Roby Lofts, [place] . Sande Rives, PA-C

## 2019-03-18 ENCOUNTER — Ambulatory Visit (HOSPITAL_COMMUNITY)
Admission: RE | Admit: 2019-03-18 | Discharge: 2019-03-18 | Disposition: A | Discharge status: Home / Self Care | Payer: PRIVATE HEALTH INSURANCE | Source: Ambulatory Visit | Attending: Nephrology | Admitting: Nephrology

## 2019-03-18 DIAGNOSIS — I10 Essential (primary) hypertension: Secondary | ICD-10-CM | POA: Insufficient documentation

## 2019-03-18 NOTE — Progress Notes (Signed)
Renal artery duplex completed. Refer to "CV Proc" under chart review to view preliminary results.  03/18/2019 10:09 AM Maudry Mayhew, MHA, RVT, RDCS, RDMS

## 2019-03-25 ENCOUNTER — Telehealth (HOSPITAL_COMMUNITY): Payer: Self-pay | Admitting: *Deleted

## 2019-03-25 ENCOUNTER — Encounter (HOSPITAL_COMMUNITY): Payer: Self-pay | Admitting: *Deleted

## 2019-03-25 NOTE — Telephone Encounter (Signed)
Patient given detailed instructions per Myocardial Perfusion Study Information Sheet for the test on 03/27/2019 at 1000. Patient notified to arrive 15 minutes early and that it is imperative to arrive on time for appointment to keep from having the test rescheduled.  If you need to cancel or reschedule your appointment, please call the office within 24 hours of your appointment. . Patient verbalized understanding.Kathryn [name], Ranae Palms My chart letter sent with instructions

## 2019-03-27 ENCOUNTER — Ambulatory Visit (HOSPITAL_COMMUNITY): Payer: PRIVATE HEALTH INSURANCE | Attending: Cardiology

## 2019-03-27 ENCOUNTER — Other Ambulatory Visit: Payer: Self-pay

## 2019-03-27 DIAGNOSIS — R072 Precordial pain: Secondary | ICD-10-CM | POA: Insufficient documentation

## 2019-03-27 LAB — MYOCARDIAL PERFUSION IMAGING
LV dias vol: 78 mL (ref 46–106)
LV sys vol: 35 mL
Peak HR: 97 {beats}/min
Rest HR: 71 {beats}/min
SDS: 0
SRS: 0
SSS: 0
TID: 1.1

## 2019-03-27 MED ORDER — TECHNETIUM TC 99M TETROFOSMIN IV KIT
10.6000 | PACK | Freq: Once | INTRAVENOUS | Status: AC | PRN
  Administered 2019-03-27: 10.6 via INTRAVENOUS
  Filled 2019-03-27: qty 11

## 2019-03-27 MED ORDER — REGADENOSON 0.4 MG/5ML IV SOLN
0.4000 mg | Freq: Once | INTRAVENOUS | Status: AC
  Administered 2019-03-27: 0.4 mg via INTRAVENOUS

## 2019-03-27 MED ORDER — TECHNETIUM TC 99M TETROFOSMIN IV KIT
31.4000 | PACK | Freq: Once | INTRAVENOUS | Status: AC | PRN
  Administered 2019-03-27: 31.4 via INTRAVENOUS
  Filled 2019-03-27: qty 32

## 2019-04-08 ENCOUNTER — Ambulatory Visit (INDEPENDENT_AMBULATORY_CARE_PROVIDER_SITE_OTHER): Payer: PRIVATE HEALTH INSURANCE | Admitting: Psychology

## 2019-04-08 DIAGNOSIS — F331 Major depressive disorder, recurrent, moderate: Secondary | ICD-10-CM | POA: Diagnosis not present

## 2019-04-23 ENCOUNTER — Other Ambulatory Visit: Payer: Self-pay | Admitting: Nephrology

## 2019-04-23 DIAGNOSIS — N183 Chronic kidney disease, stage 3 unspecified: Secondary | ICD-10-CM

## 2019-05-01 ENCOUNTER — Ambulatory Visit (INDEPENDENT_AMBULATORY_CARE_PROVIDER_SITE_OTHER): Payer: PRIVATE HEALTH INSURANCE | Admitting: Psychology

## 2019-05-01 DIAGNOSIS — F331 Major depressive disorder, recurrent, moderate: Secondary | ICD-10-CM | POA: Diagnosis not present

## 2019-05-29 ENCOUNTER — Ambulatory Visit: Payer: PRIVATE HEALTH INSURANCE | Admitting: Psychology

## 2019-06-18 ENCOUNTER — Ambulatory Visit (INDEPENDENT_AMBULATORY_CARE_PROVIDER_SITE_OTHER): Payer: PRIVATE HEALTH INSURANCE | Admitting: Psychology

## 2019-06-18 DIAGNOSIS — F331 Major depressive disorder, recurrent, moderate: Secondary | ICD-10-CM

## 2019-07-10 ENCOUNTER — Ambulatory Visit (INDEPENDENT_AMBULATORY_CARE_PROVIDER_SITE_OTHER): Payer: PRIVATE HEALTH INSURANCE | Admitting: Psychology

## 2019-07-10 DIAGNOSIS — F331 Major depressive disorder, recurrent, moderate: Secondary | ICD-10-CM

## 2019-08-06 ENCOUNTER — Ambulatory Visit: Payer: PRIVATE HEALTH INSURANCE | Admitting: Psychology

## 2020-01-28 ENCOUNTER — Ambulatory Visit (INDEPENDENT_AMBULATORY_CARE_PROVIDER_SITE_OTHER): Payer: PRIVATE HEALTH INSURANCE | Admitting: Psychology

## 2020-01-28 DIAGNOSIS — F4323 Adjustment disorder with mixed anxiety and depressed mood: Secondary | ICD-10-CM | POA: Diagnosis not present

## 2020-02-19 ENCOUNTER — Telehealth: Payer: Self-pay | Admitting: *Deleted

## 2020-02-19 NOTE — Telephone Encounter (Signed)
A detailed message was left, re: her follow up visit. 

## 2020-03-10 ENCOUNTER — Ambulatory Visit (INDEPENDENT_AMBULATORY_CARE_PROVIDER_SITE_OTHER): Payer: PRIVATE HEALTH INSURANCE | Admitting: Psychology

## 2020-03-10 DIAGNOSIS — F4323 Adjustment disorder with mixed anxiety and depressed mood: Secondary | ICD-10-CM

## 2020-03-24 ENCOUNTER — Ambulatory Visit (INDEPENDENT_AMBULATORY_CARE_PROVIDER_SITE_OTHER): Payer: PRIVATE HEALTH INSURANCE | Admitting: Psychology

## 2020-03-24 DIAGNOSIS — F4323 Adjustment disorder with mixed anxiety and depressed mood: Secondary | ICD-10-CM

## 2020-04-07 ENCOUNTER — Ambulatory Visit (INDEPENDENT_AMBULATORY_CARE_PROVIDER_SITE_OTHER): Payer: PRIVATE HEALTH INSURANCE | Admitting: Psychology

## 2020-04-07 DIAGNOSIS — F4323 Adjustment disorder with mixed anxiety and depressed mood: Secondary | ICD-10-CM

## 2020-05-03 ENCOUNTER — Ambulatory Visit (INDEPENDENT_AMBULATORY_CARE_PROVIDER_SITE_OTHER): Payer: PRIVATE HEALTH INSURANCE | Admitting: Psychology

## 2020-05-03 DIAGNOSIS — F4323 Adjustment disorder with mixed anxiety and depressed mood: Secondary | ICD-10-CM | POA: Diagnosis not present

## 2020-05-05 NOTE — Progress Notes (Signed)
HPI: FU hypertension and chest pain. Carotid Dopplers November 2015 showed 1 to 39% bilateral stenosis.  Nuclear stress test 2017 showed no ischemia and normal LV function. Echocardiogram July 2020 showed normal LV function. Patient does have family history of coronary disease with both mother and brother with myocardial infarction in their 41s. Renal Dopplers September 2020 showed 1 to 59% bilateral renal artery stenosis.  Nuclear study September 2020 showed ejection fraction 56% and no ischemia. Since last seen, she denies dyspnea, chest pain, palpitations or syncope.  Current Outpatient Medications  Medication Sig Dispense Refill  . ADDERALL XR 30 MG 24 hr capsule Take 30 mg by mouth daily.    Marland Kitchen ALPRAZolam (XANAX) 0.5 MG tablet Take 0.5 mg by mouth at bedtime as needed for anxiety.    Marland Kitchen amLODipine (NORVASC) 5 MG tablet TAKE 1 TABLET BY MOUTH  DAILY (Patient taking differently: Take 5 mg by mouth daily. ) 45 tablet 0  . aspirin 81 MG tablet Take 81 mg by mouth daily.    Marland Kitchen BIOTIN 5000 PO Take 5,000 mg by mouth at bedtime. 5OOO MCG BID     . buPROPion (WELLBUTRIN XL) 300 MG 24 hr tablet Take 300 mg by mouth every morning.    . cyanocobalamin 1000 MCG tablet Take 3,000 mcg by mouth daily.     Marland Kitchen desvenlafaxine (PRISTIQ) 100 MG 24 hr tablet Take 100 mg by mouth every morning.    . Ginger, Zingiber officinalis, (GINGER PO) Take 12 mg by mouth every morning.    . hydrALAZINE (APRESOLINE) 25 MG tablet Take 1 tablet (25 mg total) by mouth 2 (two) times daily. (Patient taking differently: Take 25 mg by mouth 2 (two) times daily. ) 180 tablet 3  . levothyroxine (SYNTHROID, LEVOTHROID) 100 MCG tablet Take 112 mcg by mouth daily before breakfast.     . liothyronine (CYTOMEL) 5 MCG tablet Take 5 mcg by mouth daily.    Marland Kitchen lovastatin (MEVACOR) 40 MG tablet Take 40 mg by mouth at bedtime.    . Melatonin 5 MG TABS Take 10 mg by mouth at bedtime as needed (for sleep).     . Potassium Chloride ER 20 MEQ  TBCR TAKE 1 TABLET BY MOUTH TWO  TIMES DAILY (Patient taking differently: Take 20 mEq by mouth daily as needed (low potassium). ) 30 tablet 0  . triamterene-hydrochlorothiazide (MAXZIDE) 75-50 MG tablet Take 1 tablet by mouth daily. Restart this after FU with PCP    . TURMERIC PO Take 270 mg by mouth every morning.    . valACYclovir (VALTREX) 500 MG tablet Take 500 mg by mouth daily as needed (for outbreaks).      No current facility-administered medications for this visit.     Past Medical History:  Diagnosis Date  . AKI (acute kidney injury) (HCC) 01/21/2019  . Anxiety   . GERD (gastroesophageal reflux disease)   . Graves disease    with hyperthyroidism diagnosed at age 3 (treated with propylthiouracil for 2 years)  . Hyperlipidemia   . Hypertension   . Hypothyroidism   . Sleep disturbance     Past Surgical History:  Procedure Laterality Date  . CARPAL TUNNEL RELEASE    . CESAREAN SECTION    . Rt ovary and fallopian tube removed due to cysts      Social History   Socioeconomic History  . Marital status: Married    Spouse name: Not on file  . Number of children: 1  .  Years of education: Not on file  . Highest education level: Not on file  Occupational History  . Not on file  Tobacco Use  . Smoking status: Never Smoker  . Smokeless tobacco: Never Used  Vaping Use  . Vaping Use: Never used  Substance and Sexual Activity  . Alcohol use: Yes  . Drug use: No  . Sexual activity: Not on file  Other Topics Concern  . Not on file  Social History Narrative  . Not on file   Social Determinants of Health   Financial Resource Strain:   . Difficulty of Paying Living Expenses: Not on file  Food Insecurity:   . Worried About Programme researcher, broadcasting/film/video in the Last Year: Not on file  . Ran Out of Food in the Last Year: Not on file  Transportation Needs:   . Lack of Transportation (Medical): Not on file  . Lack of Transportation (Non-Medical): Not on file  Physical Activity:     . Days of Exercise per Week: Not on file  . Minutes of Exercise per Session: Not on file  Stress:   . Feeling of Stress : Not on file  Social Connections:   . Frequency of Communication with Friends and Family: Not on file  . Frequency of Social Gatherings with Friends and Family: Not on file  . Attends Religious Services: Not on file  . Active Member of Clubs or Organizations: Not on file  . Attends Banker Meetings: Not on file  . Marital Status: Not on file  Intimate Partner Violence:   . Fear of Current or Ex-Partner: Not on file  . Emotionally Abused: Not on file  . Physically Abused: Not on file  . Sexually Abused: Not on file    Family History  Problem Relation Age of Onset  . Heart disease Mother        died of heart failure  . Heart attack Mother 12  . CAD Brother     ROS: no fevers or chills, productive cough, hemoptysis, dysphasia, odynophagia, melena, hematochezia, dysuria, hematuria, rash, seizure activity, orthopnea, PND, pedal edema, claudication. Remaining systems are negative.  Physical Exam: Well-developed well-nourished in no acute distress.  Skin is warm and dry.  HEENT is normal.  Neck is supple.  Chest is clear to auscultation with normal expansion.  Cardiovascular exam is regular rate and rhythm.  Abdominal exam nontender or distended. No masses palpated. Extremities show no edema. neuro grossly intact  ECG-sinus rhythm at a rate of 84, occasional PVCs, inferior infarct.  Personally reviewed  A/P  1 history of chest pain-previous symptoms felt to be atypical.  Nuclear study showed no ischemia.  Given strong family history of coronary artery disease we will schedule a calcium score for risk stratification.  2 hypertension-patient's blood pressure is controlled.  Continue present medical regimen.  3 hyperlipidemia-continue statin.  If calcium score elevated will discontinue lovastatin and instead treat with Crestor 40 mg daily.  4  renal insufficiency-followed by nephrology.  Olga Millers, MD

## 2020-05-11 ENCOUNTER — Other Ambulatory Visit: Payer: Self-pay

## 2020-05-11 ENCOUNTER — Encounter: Payer: Self-pay | Admitting: Cardiology

## 2020-05-11 ENCOUNTER — Ambulatory Visit: Payer: PRIVATE HEALTH INSURANCE | Admitting: Cardiology

## 2020-05-11 VITALS — BP 126/74 | HR 84 | Ht 65.0 in | Wt 153.4 lb

## 2020-05-11 DIAGNOSIS — R072 Precordial pain: Secondary | ICD-10-CM | POA: Diagnosis not present

## 2020-05-11 DIAGNOSIS — E7800 Pure hypercholesterolemia, unspecified: Secondary | ICD-10-CM

## 2020-05-11 DIAGNOSIS — I10 Essential (primary) hypertension: Secondary | ICD-10-CM

## 2020-05-11 NOTE — Patient Instructions (Signed)
  Testing/Procedures:  CORONARY CALCIUM SCORING CT @ [address]   Follow-Up: At [facility], you and your health needs are our priority.  As part of our continuing mission to provide you with exceptional heart care, we have created designated Provider Care Teams.  These Care Teams include your primary Cardiologist (physician) and Advanced Practice Providers (APPs -  Physician Assistants and Nurse Practitioners) who all work together to provide you with the care you need, when you need it.  We recommend signing up for the patient portal called "MyChart".  Sign up information is provided on this After Visit Summary.  MyChart is used to connect with patients for Virtual Visits (Telemedicine).  Patients are able to view lab/test results, encounter notes, upcoming appointments, etc.  Non-urgent messages can be sent to your provider as well.   To learn more about what you can do with MyChart, go to [email or web address].    Your next appointment:   12 month(s)  The format for your next appointment:   In Person  Provider:   Olga Millers, MD

## 2020-05-17 ENCOUNTER — Ambulatory Visit (INDEPENDENT_AMBULATORY_CARE_PROVIDER_SITE_OTHER): Payer: PRIVATE HEALTH INSURANCE | Admitting: Psychology

## 2020-05-17 DIAGNOSIS — F4323 Adjustment disorder with mixed anxiety and depressed mood: Secondary | ICD-10-CM

## 2020-05-31 ENCOUNTER — Ambulatory Visit (INDEPENDENT_AMBULATORY_CARE_PROVIDER_SITE_OTHER): Payer: PRIVATE HEALTH INSURANCE | Admitting: Psychology

## 2020-05-31 DIAGNOSIS — F4323 Adjustment disorder with mixed anxiety and depressed mood: Secondary | ICD-10-CM | POA: Diagnosis not present

## 2020-06-01 ENCOUNTER — Ambulatory Visit (INDEPENDENT_AMBULATORY_CARE_PROVIDER_SITE_OTHER)
Admission: RE | Admit: 2020-06-01 | Discharge: 2020-06-01 | Disposition: A | Discharge status: Home / Self Care | Payer: Self-pay | Source: Ambulatory Visit | Attending: Cardiology | Admitting: Cardiology

## 2020-06-01 ENCOUNTER — Other Ambulatory Visit: Payer: Self-pay

## 2020-06-01 DIAGNOSIS — R072 Precordial pain: Secondary | ICD-10-CM

## 2020-06-02 ENCOUNTER — Telehealth: Payer: Self-pay | Admitting: *Deleted

## 2020-06-02 DIAGNOSIS — E7800 Pure hypercholesterolemia, unspecified: Secondary | ICD-10-CM

## 2020-06-02 MED ORDER — ROSUVASTATIN CALCIUM 40 MG PO TABS: 40.0000 mg | ORAL_TABLET | Freq: Every day | ORAL | 3 refills | Status: DC

## 2020-06-02 NOTE — Telephone Encounter (Signed)
Spoke with pt, Aware of dr crenshaw's recommendations. New script sent to the pharmacy and Lab orders mailed to the pt  

## 2020-06-02 NOTE — Telephone Encounter (Signed)
-----   Message from Lewayne Bunting, MD sent at 06/01/2020  4:02 PM EST ----- DC lovastatin; crestor 40 mg daily; lipids and liver 12 weeks Olga Millers

## 2020-06-14 ENCOUNTER — Ambulatory Visit (INDEPENDENT_AMBULATORY_CARE_PROVIDER_SITE_OTHER): Payer: PRIVATE HEALTH INSURANCE | Admitting: Psychology

## 2020-06-14 DIAGNOSIS — F4323 Adjustment disorder with mixed anxiety and depressed mood: Secondary | ICD-10-CM

## 2020-06-22 IMAGING — DX CHEST - 2 VIEW
2 series · 2 of 2 positions shown · non-contrast
Comparison: None.

CLINICAL DATA: Chest pain

EXAM:
CHEST - 2 VIEW

[chest pa]
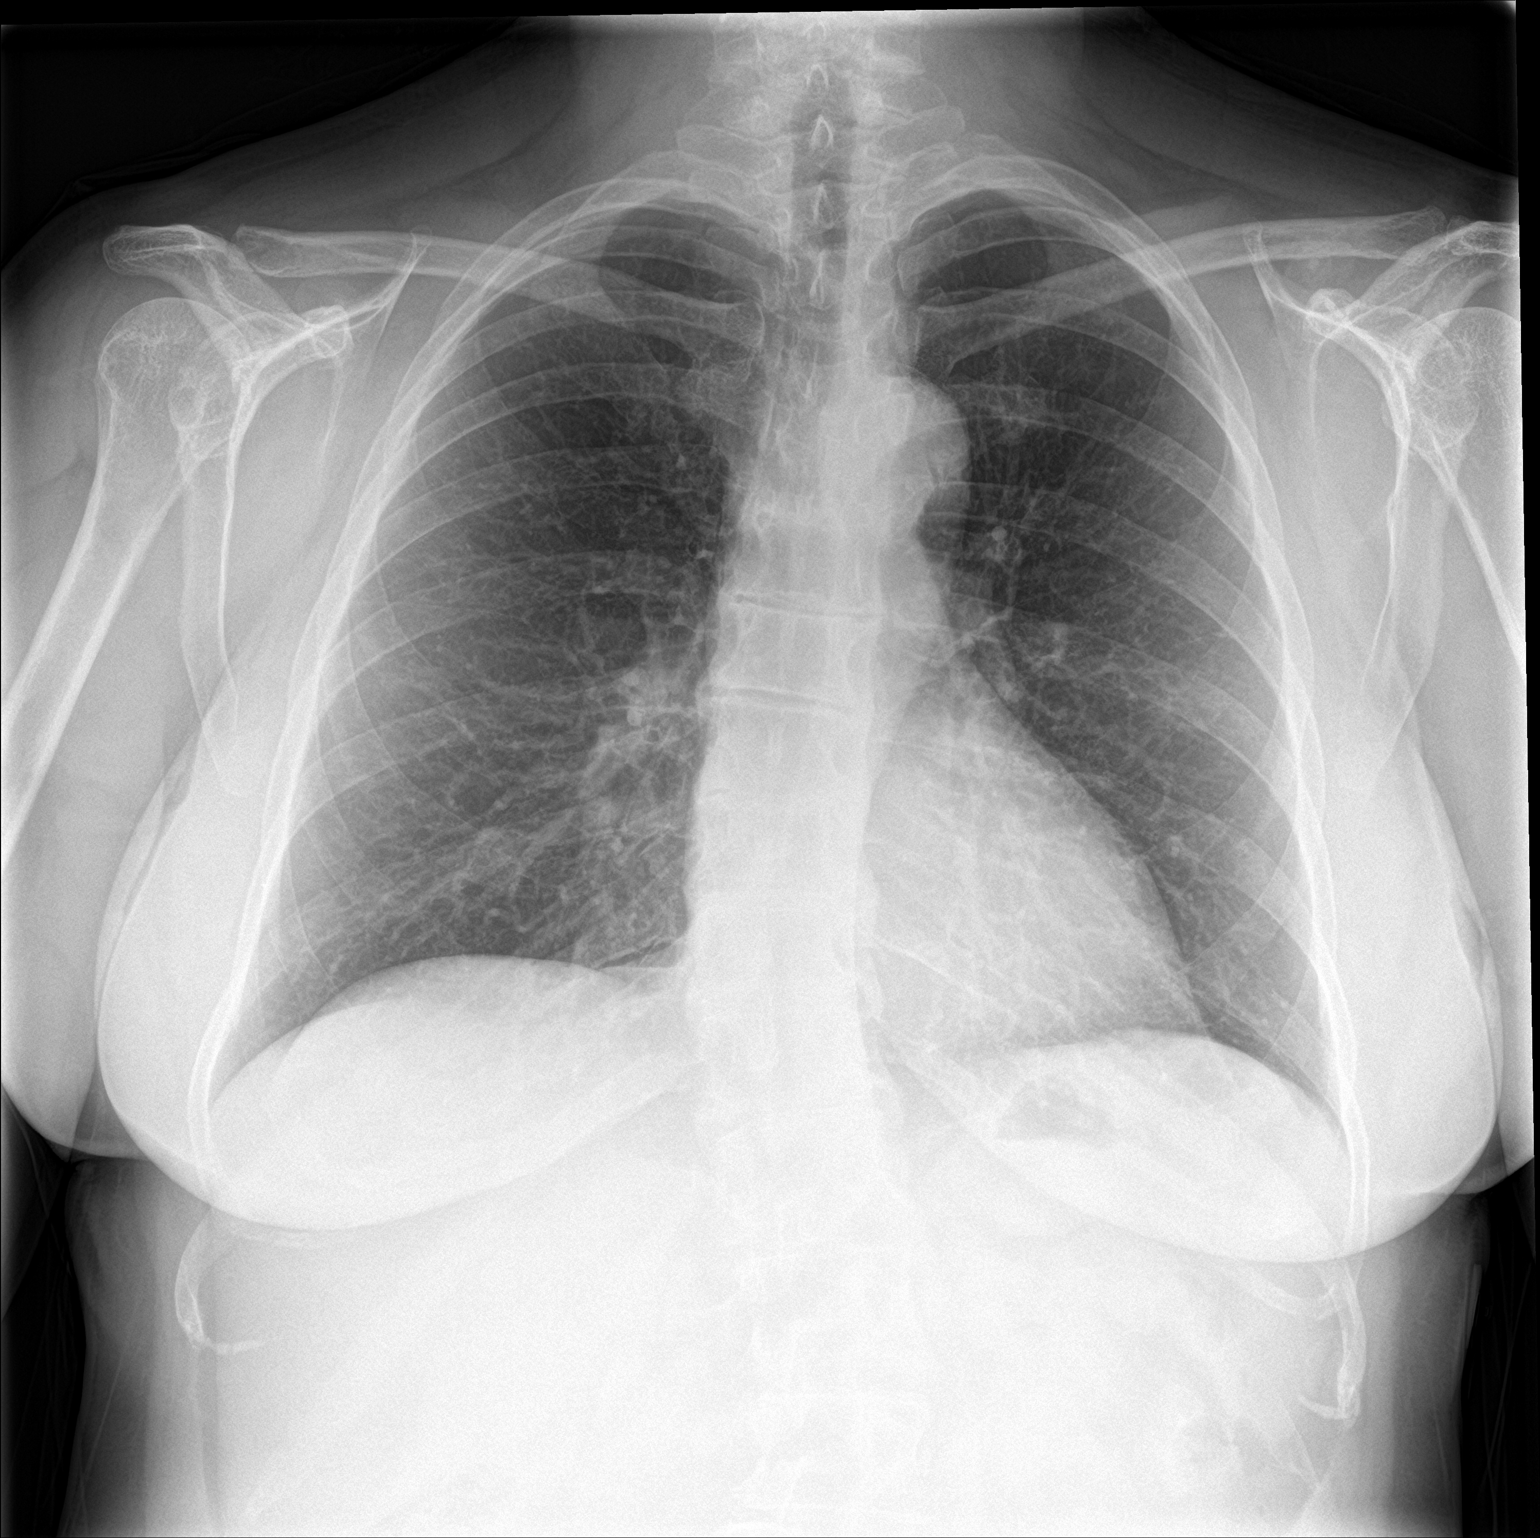

[chest lat]
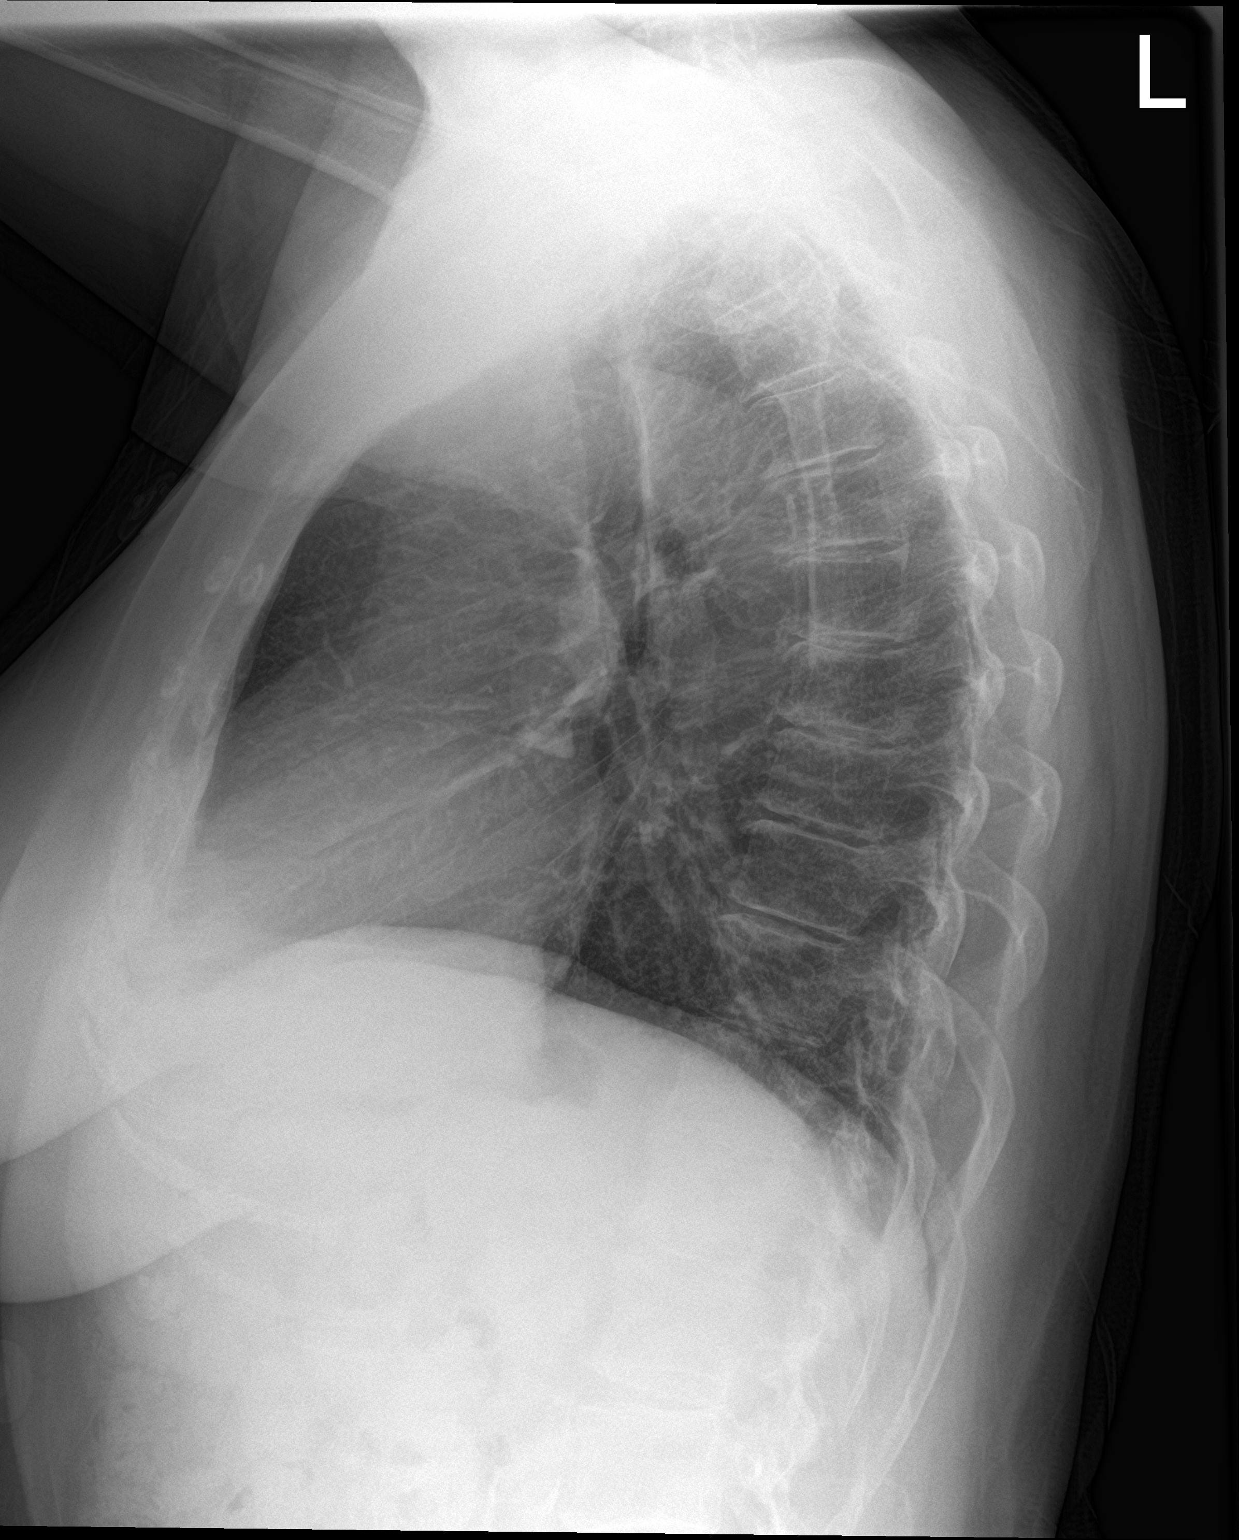

[2 of 2 positions shown; findings below may reference images not displayed]

FINDINGS: The heart size and mediastinal contours are within normal limits.
Both lungs are clear. The visualized skeletal structures are
unremarkable.
IMPRESSION: No active cardiopulmonary disease.

## 2020-06-29 ENCOUNTER — Ambulatory Visit (INDEPENDENT_AMBULATORY_CARE_PROVIDER_SITE_OTHER): Payer: PRIVATE HEALTH INSURANCE | Admitting: Psychology

## 2020-06-29 DIAGNOSIS — F4323 Adjustment disorder with mixed anxiety and depressed mood: Secondary | ICD-10-CM

## 2020-08-03 ENCOUNTER — Ambulatory Visit (INDEPENDENT_AMBULATORY_CARE_PROVIDER_SITE_OTHER): Payer: PRIVATE HEALTH INSURANCE | Admitting: Psychology

## 2020-08-03 DIAGNOSIS — F4323 Adjustment disorder with mixed anxiety and depressed mood: Secondary | ICD-10-CM | POA: Diagnosis not present

## 2020-08-17 ENCOUNTER — Ambulatory Visit (INDEPENDENT_AMBULATORY_CARE_PROVIDER_SITE_OTHER): Payer: PRIVATE HEALTH INSURANCE | Admitting: Psychology

## 2020-08-17 DIAGNOSIS — F4323 Adjustment disorder with mixed anxiety and depressed mood: Secondary | ICD-10-CM | POA: Diagnosis not present

## 2020-08-31 ENCOUNTER — Ambulatory Visit (INDEPENDENT_AMBULATORY_CARE_PROVIDER_SITE_OTHER): Payer: PRIVATE HEALTH INSURANCE | Admitting: Psychology

## 2020-08-31 DIAGNOSIS — F4323 Adjustment disorder with mixed anxiety and depressed mood: Secondary | ICD-10-CM | POA: Diagnosis not present

## 2020-09-13 ENCOUNTER — Ambulatory Visit (INDEPENDENT_AMBULATORY_CARE_PROVIDER_SITE_OTHER): Payer: PRIVATE HEALTH INSURANCE | Admitting: Psychology

## 2020-09-13 DIAGNOSIS — F4323 Adjustment disorder with mixed anxiety and depressed mood: Secondary | ICD-10-CM | POA: Diagnosis not present

## 2020-09-20 ENCOUNTER — Encounter: Payer: Self-pay | Admitting: *Deleted

## 2020-09-27 ENCOUNTER — Ambulatory Visit (INDEPENDENT_AMBULATORY_CARE_PROVIDER_SITE_OTHER): Payer: PRIVATE HEALTH INSURANCE | Admitting: Psychology

## 2020-09-27 DIAGNOSIS — F4323 Adjustment disorder with mixed anxiety and depressed mood: Secondary | ICD-10-CM | POA: Diagnosis not present

## 2020-10-06 LAB — LIPID PANEL
Chol/HDL Ratio: 2.3 ratio (ref 0.0–4.4)
Cholesterol, Total: 117 mg/dL (ref 100–199)
HDL: 51 mg/dL (ref >39)
LDL Chol Calc (NIH): 53 mg/dL (ref 0–99)
Triglycerides: 59 mg/dL (ref 0–149)
VLDL Cholesterol Cal: 13 mg/dL (ref 5–40)

## 2020-10-06 LAB — HEPATIC FUNCTION PANEL
ALT: 27 IU/L (ref 0–32)
AST: 27 IU/L (ref 0–40)
Albumin: 4.9 g/dL (ref 3.8–4.9)
Alkaline Phosphatase: 70 IU/L (ref 44–121)
Bilirubin Total: 0.4 mg/dL (ref 0.0–1.2)
Bilirubin, Direct: 0.12 mg/dL (ref 0.00–0.40)
Total Protein: 6.8 g/dL (ref 6.0–8.5)

## 2020-10-19 ENCOUNTER — Ambulatory Visit (INDEPENDENT_AMBULATORY_CARE_PROVIDER_SITE_OTHER): Payer: PRIVATE HEALTH INSURANCE | Admitting: Psychology

## 2020-10-19 DIAGNOSIS — F4323 Adjustment disorder with mixed anxiety and depressed mood: Secondary | ICD-10-CM

## 2020-11-02 ENCOUNTER — Ambulatory Visit (INDEPENDENT_AMBULATORY_CARE_PROVIDER_SITE_OTHER): Payer: PRIVATE HEALTH INSURANCE | Admitting: Psychology

## 2020-11-02 DIAGNOSIS — F4323 Adjustment disorder with mixed anxiety and depressed mood: Secondary | ICD-10-CM

## 2020-11-16 ENCOUNTER — Ambulatory Visit (INDEPENDENT_AMBULATORY_CARE_PROVIDER_SITE_OTHER): Payer: PRIVATE HEALTH INSURANCE | Admitting: Psychology

## 2020-11-16 DIAGNOSIS — F4323 Adjustment disorder with mixed anxiety and depressed mood: Secondary | ICD-10-CM | POA: Diagnosis not present

## 2020-12-02 ENCOUNTER — Ambulatory Visit (INDEPENDENT_AMBULATORY_CARE_PROVIDER_SITE_OTHER): Payer: PRIVATE HEALTH INSURANCE | Admitting: Psychology

## 2020-12-02 DIAGNOSIS — F4323 Adjustment disorder with mixed anxiety and depressed mood: Secondary | ICD-10-CM | POA: Diagnosis not present

## 2021-05-09 ENCOUNTER — Other Ambulatory Visit: Payer: Self-pay | Admitting: Cardiology

## 2021-05-09 DIAGNOSIS — E7800 Pure hypercholesterolemia, unspecified: Secondary | ICD-10-CM

## 2021-06-23 NOTE — Progress Notes (Signed)
HPI:FU hypertension and CAD. Carotid Dopplers November 2015 showed 1 to 39% bilateral stenosis. Echocardiogram July 2020 showed normal LV function. Patient does have family history of coronary disease with both mother and brother with myocardial infarction in their 19s. Renal Dopplers September 2020 showed 1 to 59% bilateral renal artery stenosis.  Nuclear study September 2020 showed ejection fraction 56% and no ischemia.  Calcium score November 2021 2813 which was 99th percentile.  Since last seen, she denies dyspnea, chest pain, palpitations or syncope.  Current Outpatient Medications  Medication Sig Dispense Refill   ADDERALL XR 30 MG 24 hr capsule Take 30 mg by mouth daily.     ALPRAZolam (XANAX) 0.5 MG tablet Take 0.5 mg by mouth at bedtime as needed for anxiety.     amLODipine (NORVASC) 5 MG tablet TAKE 1 TABLET BY MOUTH  DAILY (Patient taking differently: Take 5 mg by mouth daily.) 45 tablet 0   aspirin 81 MG tablet Take 81 mg by mouth daily.     buPROPion (WELLBUTRIN XL) 300 MG 24 hr tablet Take 300 mg by mouth every morning.     cyanocobalamin 1000 MCG tablet Take 3,000 mcg by mouth daily.      desvenlafaxine (PRISTIQ) 100 MG 24 hr tablet Take 100 mg by mouth every morning.     hydrALAZINE (APRESOLINE) 25 MG tablet Take 1 tablet (25 mg total) by mouth 2 (two) times daily. (Patient taking differently: Take 25 mg by mouth 2 (two) times daily.) 180 tablet 3   levothyroxine (SYNTHROID, LEVOTHROID) 100 MCG tablet Take 112 mcg by mouth daily before breakfast.      losartan (COZAAR) 100 MG tablet Take 100 mg by mouth daily.     Potassium Chloride ER 20 MEQ TBCR TAKE 1 TABLET BY MOUTH TWO  TIMES DAILY (Patient taking differently: Take 20 mEq by mouth daily as needed (low potassium).) 30 tablet 0   rosuvastatin (CRESTOR) 40 MG tablet TAKE 1 TABLET DAILY 90 tablet 3   triamterene-hydrochlorothiazide (MAXZIDE) 75-50 MG tablet Take 1 tablet by mouth daily. Restart this after FU with PCP      valACYclovir (VALTREX) 500 MG tablet Take 500 mg by mouth daily as needed (for outbreaks).      VITAMIN D PO Take 1 capsule by mouth daily.     No current facility-administered medications for this visit.     Past Medical History:  Diagnosis Date   AKI (acute kidney injury) ([place]) 01/21/2019   Anxiety    GERD (gastroesophageal reflux disease)    Graves disease    with hyperthyroidism diagnosed at age 7 (treated with propylthiouracil for 2 years)   Hyperlipidemia    Hypertension    Hypothyroidism    Sleep disturbance     Past Surgical History:  Procedure Laterality Date   CARPAL TUNNEL RELEASE     CESAREAN SECTION     Rt ovary and fallopian tube removed due to cysts      Social History   Socioeconomic History   Marital status: Married    Spouse name: Not on file   Number of children: 1   Years of education: Not on file   Highest education level: Not on file  Occupational History   Not on file  Tobacco Use   Smoking status: Never   Smokeless tobacco: Never  Vaping Use   Vaping Use: Never used  Substance and Sexual Activity   Alcohol use: Yes   Drug use: No   Sexual  activity: Not on file  Other Topics Concern   Not on file  Social History Narrative   Not on file   Social Determinants of Health   Financial Resource Strain: Not on file  Food Insecurity: Not on file  Transportation Needs: Not on file  Physical Activity: Not on file  Stress: Not on file  Social Connections: Not on file  Intimate Partner Violence: Not on file    Family History  Problem Relation Age of Onset   Heart disease Mother        died of heart failure   Heart attack Mother 14   CAD Brother     ROS: no fevers or chills, productive cough, hemoptysis, dysphasia, odynophagia, melena, hematochezia, dysuria, hematuria, rash, seizure activity, orthopnea, PND, pedal edema, claudication. Remaining systems are negative.  Physical Exam: Well-developed well-nourished in no acute distress.   Skin is warm and dry.  HEENT is normal.  Neck is supple.  Chest is clear to auscultation with normal expansion.  Cardiovascular exam is regular rate and rhythm.  Abdominal exam nontender or distended. No masses palpated. Extremities show no edema. neuro grossly intact  ECG-normal sinus rhythm at a rate of 65, no ST changes.  Personally reviewed  A/P  1 coronary artery disease-patient denies chest pain.  Continue medical therapy with aspirin and statin.  2 hyperlipidemia-continue Crestor at present dose.  Check lipids and liver.  3 hypertension-blood pressure controlled.  Continue present medications.  4 history of renal insufficiency-patient is followed by nephrology.  Olga Millers, MD

## 2021-06-30 ENCOUNTER — Ambulatory Visit: Payer: PRIVATE HEALTH INSURANCE | Admitting: Cardiology

## 2021-06-30 ENCOUNTER — Encounter: Payer: Self-pay | Admitting: Cardiology

## 2021-06-30 ENCOUNTER — Other Ambulatory Visit: Payer: Self-pay

## 2021-06-30 VITALS — BP 128/78 | HR 65 | Ht 65.0 in | Wt 157.4 lb

## 2021-06-30 DIAGNOSIS — E7800 Pure hypercholesterolemia, unspecified: Secondary | ICD-10-CM | POA: Diagnosis not present

## 2021-06-30 DIAGNOSIS — I10 Essential (primary) hypertension: Secondary | ICD-10-CM | POA: Diagnosis not present

## 2021-06-30 DIAGNOSIS — I2510 Atherosclerotic heart disease of native coronary artery without angina pectoris: Secondary | ICD-10-CM

## 2021-06-30 LAB — LIPID PANEL
Chol/HDL Ratio: 2.3 ratio (ref 0.0–4.4)
Cholesterol, Total: 118 mg/dL (ref 100–199)
HDL: 52 mg/dL (ref >39)
LDL Chol Calc (NIH): 50 mg/dL (ref 0–99)
Triglycerides: 82 mg/dL (ref 0–149)
VLDL Cholesterol Cal: 16 mg/dL (ref 5–40)

## 2021-06-30 LAB — HEPATIC FUNCTION PANEL
ALT: 25 IU/L (ref 0–32)
AST: 25 IU/L (ref 0–40)
Albumin: 5.2 g/dL — ABNORMAL HIGH (ref 3.8–4.9)
Alkaline Phosphatase: 74 IU/L (ref 44–121)
Bilirubin Total: 0.5 mg/dL (ref 0.0–1.2)
Bilirubin, Direct: 0.18 mg/dL (ref 0.00–0.40)
Total Protein: 6.7 g/dL (ref 6.0–8.5)

## 2021-06-30 NOTE — Patient Instructions (Signed)

## 2021-11-02 IMAGING — CT CT CARDIAC CORONARY ARTERY CALCIUM SCORE
3 series · 14 of 20 positions shown, 15 images · non-contrast
Comparison: None.
COMPARISON: None.

Addendum:
EXAM:
OVER-READ INTERPRETATION  CT CHEST

The following report is an over-read performed by radiologist Dr.
Sigfredo Lehrer [REDACTED] on 06/01/2020. This
over-read does not include interpretation of cardiac or coronary
anatomy or pathology. The coronary calcium score interpretation by
the cardiologist is attached.
CLINICAL DATA: Risk stratification
Coronary Calcium Score
TECHNIQUE: The patient was scanned on a Siemens Somatom 64 slice scanner. Axial
non-contrast 3 mm slices were carried out through the heart. The
data set was analyzed on a dedicated work station and scored using
the Agatson method.

[Series 2: casc 3.0 bv41 2 bestdiast 71 % · axial · 0.35mm/px · z∈[+1138,+1201]mm · 4 of 35 slices shown, 5 images]
[im 7/35  vessel]
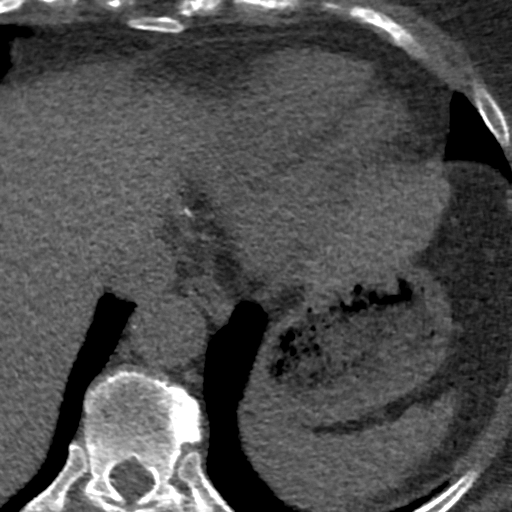
[im 7/35  lung]
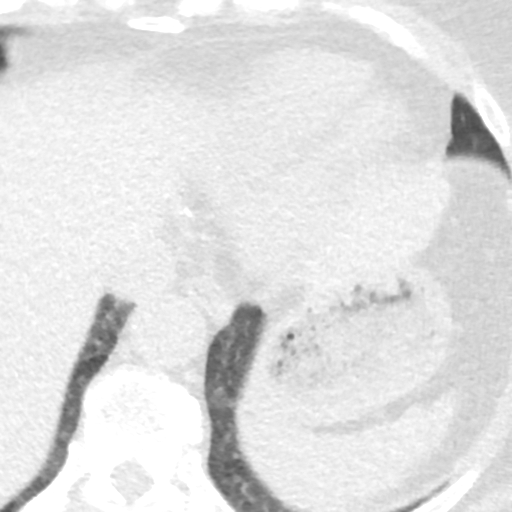
[im 14/35  vessel]
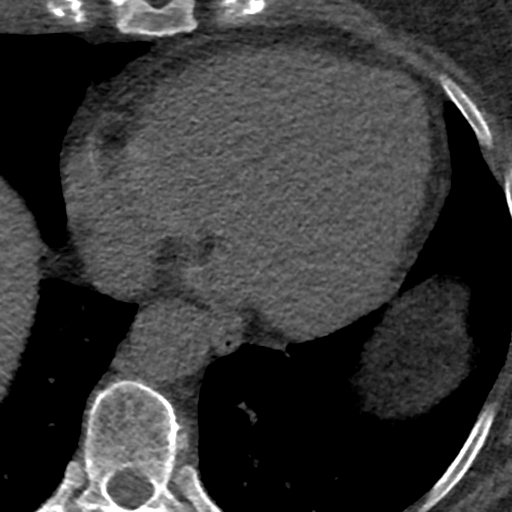
[im 21/35  vessel]
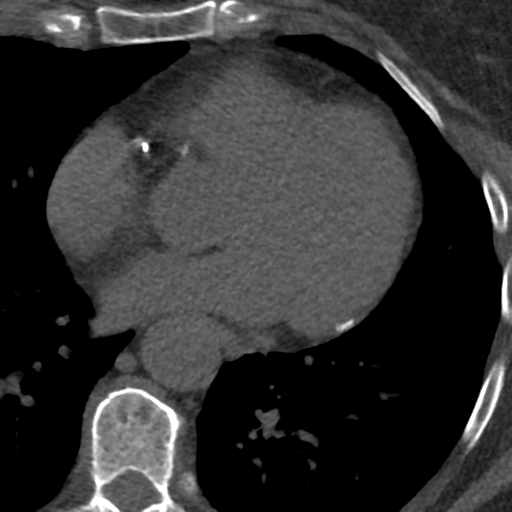
[im 28/35  vessel]
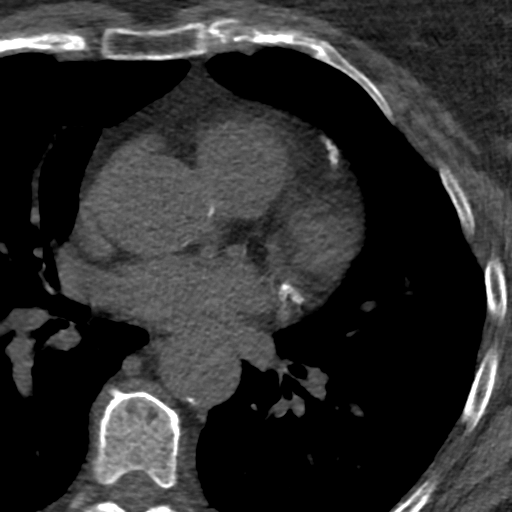

[Series 3: lung 70 % · axial · 0.68mm/px · z∈[+1135,+1204]mm · 5 of 35 slices shown]
[im 6/35  lung]
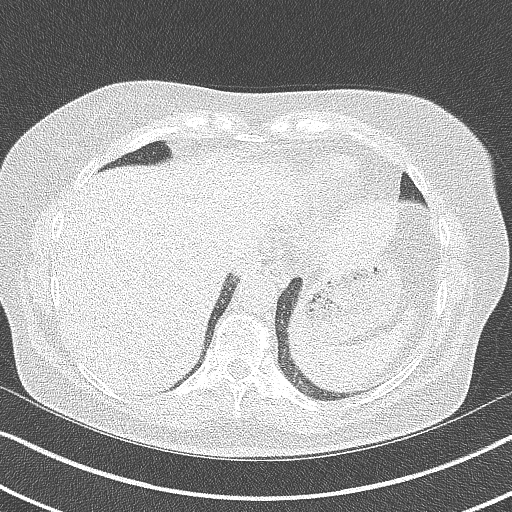
[im 12/35  lung]
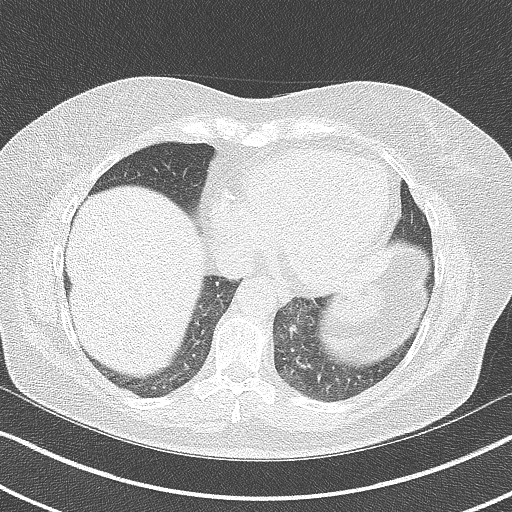
[im 18/35  lung]
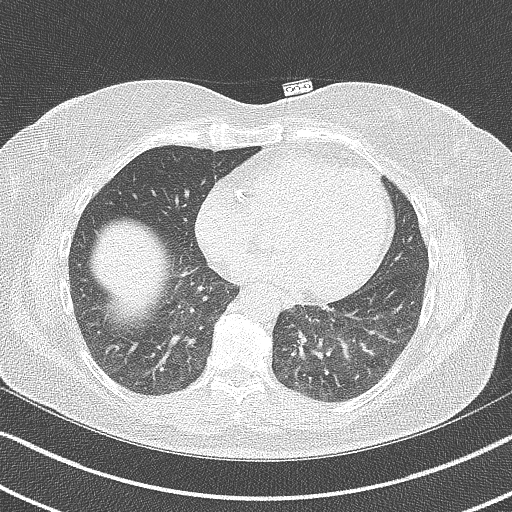
[im 23/35  lung]
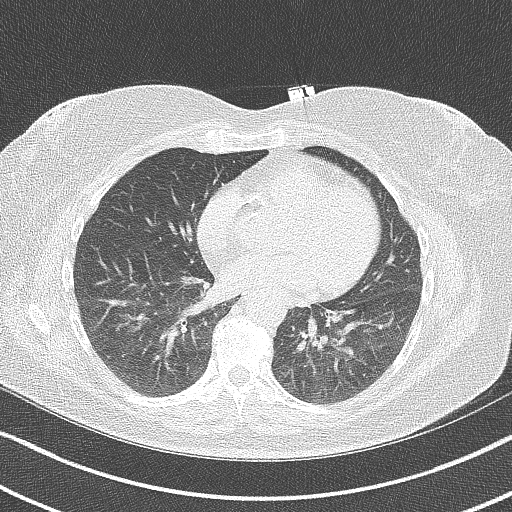
[im 29/35  lung]
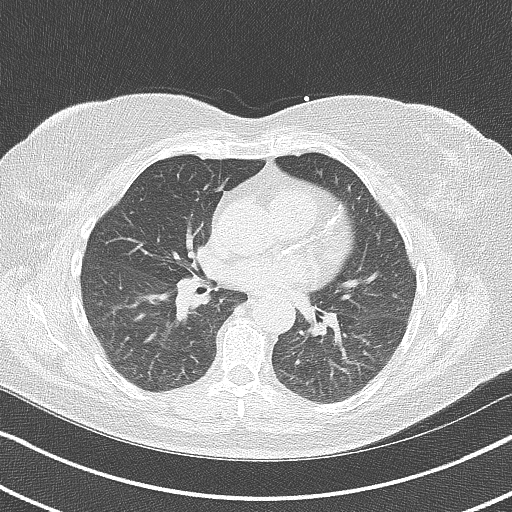

[Series 4: lung st 70 % · axial · 0.68mm/px · z∈[+1135,+1204]mm · 5 of 35 slices shown]
[im 6/35  lung]
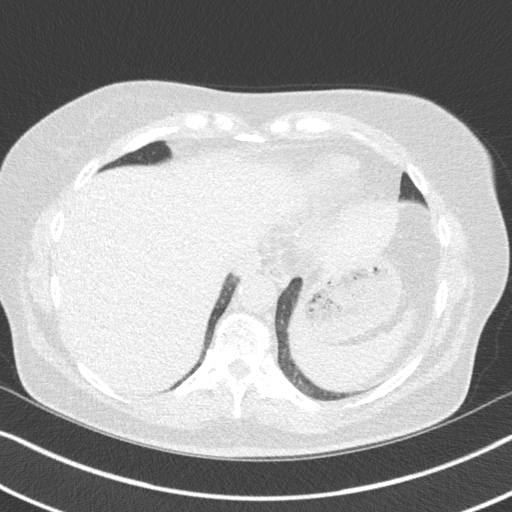
[im 12/35  lung]
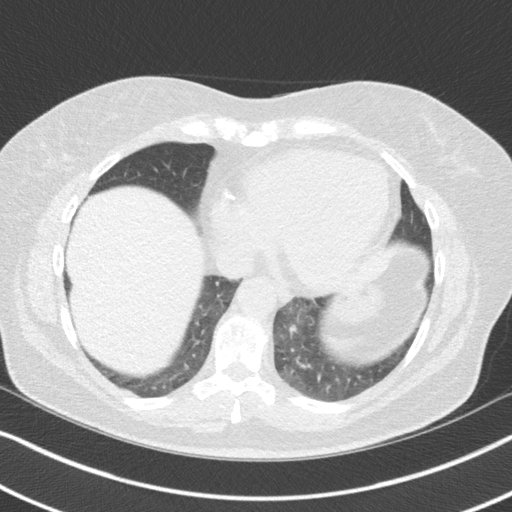
[im 18/35  lung]
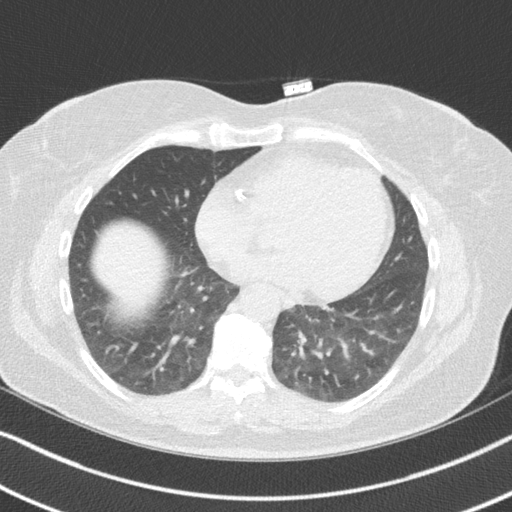
[im 23/35  lung]
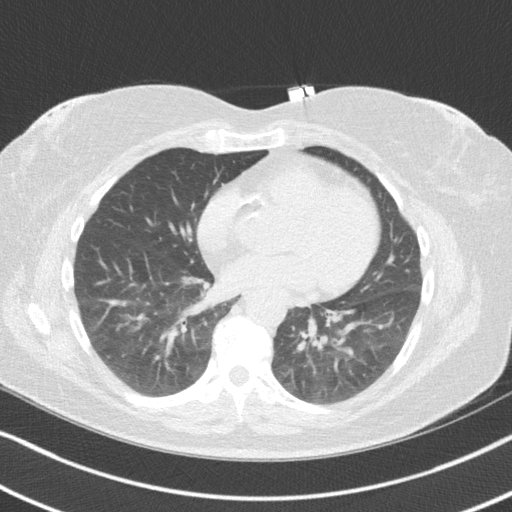
[im 29/35  lung]
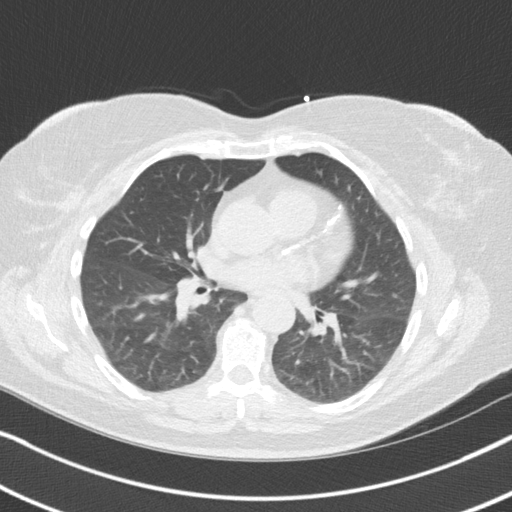

[14 of 20 positions shown; findings below may reference images not displayed]

FINDINGS: Within the visualized portions of the thorax there are no suspicious
appearing pulmonary nodules or masses, there is no acute
consolidative airspace disease, no pleural effusions, no
pneumothorax and no lymphadenopathy. Visualized portions of the
upper abdomen are unremarkable. There are no aggressive appearing
lytic or blastic lesions noted in the visualized portions of the
skeleton.
IMPRESSION: No significant incidental noncardiac findings are noted.
FINDINGS: Non-cardiac: See separate report from [REDACTED].

Ascending aorta: Normal size; aortic atherosclerosis noted

Pericardium: Normal

Coronary arteries: Normal origin
IMPRESSION: Coronary calcium score of 3771. This was 99 percentile for age and
sex matched control.

[place]

*** End of Addendum ***
EXAM:
OVER-READ INTERPRETATION  CT CHEST

The following report is an over-read performed by radiologist Dr.
Sigfredo Lehrer [REDACTED] on 06/01/2020. This
over-read does not include interpretation of cardiac or coronary
anatomy or pathology. The coronary calcium score interpretation by
the cardiologist is attached.
FINDINGS: Within the visualized portions of the thorax there are no suspicious
appearing pulmonary nodules or masses, there is no acute
consolidative airspace disease, no pleural effusions, no
pneumothorax and no lymphadenopathy. Visualized portions of the
upper abdomen are unremarkable. There are no aggressive appearing
lytic or blastic lesions noted in the visualized portions of the
skeleton.
IMPRESSION: No significant incidental noncardiac findings are noted.

## 2022-05-03 ENCOUNTER — Other Ambulatory Visit: Payer: Self-pay | Admitting: Cardiology

## 2022-05-03 DIAGNOSIS — E7800 Pure hypercholesterolemia, unspecified: Secondary | ICD-10-CM

## 2022-06-14 ENCOUNTER — Other Ambulatory Visit: Payer: Self-pay

## 2022-06-14 DIAGNOSIS — E7800 Pure hypercholesterolemia, unspecified: Secondary | ICD-10-CM

## 2022-06-14 MED ORDER — ROSUVASTATIN CALCIUM 40 MG PO TABS: 40.0000 mg | ORAL_TABLET | Freq: Every day | ORAL | 0 refills | Status: DC

## 2022-08-21 NOTE — Progress Notes (Signed)
HPI: FU hypertension and CAD. Carotid Dopplers November 2015 showed 1 to 39% bilateral stenosis. Echocardiogram July 2020 showed normal LV function. Patient does have family history of coronary disease with both mother and brother with myocardial infarction in their 57s. Renal Dopplers September 2020 showed 1 to 59% bilateral renal artery stenosis.  Nuclear study September 2020 showed ejection fraction 56% and no ischemia.  Calcium score November 2021 2813 which was 99th percentile.  Since last seen, over the past 6 months she has had progressive dyspnea/chest tightness with vigorous activities relieved with rest.  These do not occur at rest or with routine activities.  No orthopnea, PND or pedal edema.  Current Outpatient Medications  Medication Sig Dispense Refill   ADDERALL XR 30 MG 24 hr capsule Take 30 mg by mouth daily.     albuterol (VENTOLIN HFA) 108 (90 Base) MCG/ACT inhaler Inhale 1 puff into the lungs every 6 (six) hours as needed for wheezing or shortness of breath.     ALPRAZolam (XANAX) 0.5 MG tablet Take 0.5 mg by mouth at bedtime as needed for anxiety.     amLODipine (NORVASC) 5 MG tablet TAKE 1 TABLET BY MOUTH  DAILY 45 tablet 0   aspirin 81 MG tablet Take 81 mg by mouth daily.     buPROPion (WELLBUTRIN XL) 300 MG 24 hr tablet Take 300 mg by mouth every morning.     clobetasol (TEMOVATE) 0.05 % external solution Apply 1 Application topically 2 (two) times daily.     cyanocobalamin 1000 MCG tablet Take 3,000 mcg by mouth daily.      desvenlafaxine (PRISTIQ) 100 MG 24 hr tablet Take 100 mg by mouth every morning.     hydrALAZINE (APRESOLINE) 25 MG tablet Take 1 tablet (25 mg total) by mouth 2 (two) times daily. (Patient taking differently: Take 25 mg by mouth 2 (two) times daily.) 180 tablet 3   levothyroxine (SYNTHROID, LEVOTHROID) 100 MCG tablet Take 112 mcg by mouth daily before breakfast.      losartan (COZAAR) 100 MG tablet Take 100 mg by mouth daily.     Potassium  Chloride ER 20 MEQ TBCR TAKE 1 TABLET BY MOUTH TWO  TIMES DAILY (Patient taking differently: Take 20 mEq by mouth daily as needed (low potassium).) 30 tablet 0   rosuvastatin (CRESTOR) 40 MG tablet Take 1 tablet (40 mg total) by mouth daily. Schedule an appointment for further refills 90 tablet 0   triamterene-hydrochlorothiazide (MAXZIDE) 75-50 MG tablet Take 1 tablet by mouth daily. Restart this after FU with PCP     valACYclovir (VALTREX) 500 MG tablet Take 500 mg by mouth daily as needed (for outbreaks).      VITAMIN D PO Take 1 capsule by mouth daily.     No current facility-administered medications for this visit.     Past Medical History:  Diagnosis Date   AKI (acute kidney injury) ([place]) 01/21/2019   Anxiety    GERD (gastroesophageal reflux disease)    Graves disease    with hyperthyroidism diagnosed at age 34 (treated with propylthiouracil for 2 years)   Hyperlipidemia    Hypertension    Hypothyroidism    Sleep disturbance     Past Surgical History:  Procedure Laterality Date   CARPAL TUNNEL RELEASE     CESAREAN SECTION     Rt ovary and fallopian tube removed due to cysts      Social History   Socioeconomic History   Marital status: Legally Separated  Spouse name: Not on file   Number of children: 1   Years of education: Not on file   Highest education level: Not on file  Occupational History   Not on file  Tobacco Use   Smoking status: Never   Smokeless tobacco: Never  Vaping Use   Vaping Use: Never used  Substance and Sexual Activity   Alcohol use: Yes   Drug use: No   Sexual activity: Not on file  Other Topics Concern   Not on file  Social History Narrative   Not on file   Social Determinants of Health   Financial Resource Strain: Not on file  Food Insecurity: Not on file  Transportation Needs: Not on file  Physical Activity: Not on file  Stress: Not on file  Social Connections: Not on file  Intimate Partner Violence: Not on file    Family  History  Problem Relation Age of Onset   Heart disease Mother        died of heart failure   Heart attack Mother 69   CAD Brother     ROS: no fevers or chills, productive cough, hemoptysis, dysphasia, odynophagia, melena, hematochezia, dysuria, hematuria, rash, seizure activity, orthopnea, PND, pedal edema, claudication. Remaining systems are negative.  Physical Exam: Well-developed well-nourished in no acute distress.  Skin is warm and dry.  HEENT is normal.  Neck is supple.  Chest is clear to auscultation with normal expansion.  Cardiovascular exam is regular rate and rhythm.  Abdominal exam nontender or distended. No masses palpated. Extremities show no edema. neuro grossly intact  ECG-normal sinus rhythm at a rate of 81, no significant ST changes.  Personally reviewed  A/P  1 coronary artery disease/new onset angina-patient describes new onset chest tightness/dyspnea on exertion over the past 6 months relieved with rest.  She has significant family history and markedly elevated calcium score that we have treated medically previously.  Feel cardiac catheterization is indicated.  The risk and benefits including myocardial infarction, CVA and death discussed and she agrees to proceed.  She has had a history of renal insufficiency but her most recent creatinine July 21, 2022 was 0.8.  We will hold her diuretic and potassium the day before and the day of her procedure and she will increase oral hydration.  Will repeat echocardiogram to reassess LV function.  Continue aspirin and statin.  2 hypertension-blood pressure controlled.  Continue present medical regimen.  Check potassium and renal function.  3 hyperlipidemia-continue Crestor.  Recent laboratory showed total cholesterol 126 with LDL 54.  4 history of renal insufficiency-patient is managed by nephrology.  Kirk Ruths, MD

## 2022-08-21 NOTE — H&P (View-Only) (Signed)
HPI: FU hypertension and CAD. Carotid Dopplers November 2015 showed 1 to 39% bilateral stenosis. Echocardiogram July 2020 showed normal LV function. Patient does have family history of coronary disease with both mother and brother with myocardial infarction in their 57s. Renal Dopplers September 2020 showed 1 to 59% bilateral renal artery stenosis.  Nuclear study September 2020 showed ejection fraction 56% and no ischemia.  Calcium score November 2021 2813 which was 99th percentile.  Since last seen, over the past 6 months she has had progressive dyspnea/chest tightness with vigorous activities relieved with rest.  These do not occur at rest or with routine activities.  No orthopnea, PND or pedal edema.  Current Outpatient Medications  Medication Sig Dispense Refill   ADDERALL XR 30 MG 24 hr capsule Take 30 mg by mouth daily.     albuterol (VENTOLIN HFA) 108 (90 Base) MCG/ACT inhaler Inhale 1 puff into the lungs every 6 (six) hours as needed for wheezing or shortness of breath.     ALPRAZolam (XANAX) 0.5 MG tablet Take 0.5 mg by mouth at bedtime as needed for anxiety.     amLODipine (NORVASC) 5 MG tablet TAKE 1 TABLET BY MOUTH  DAILY 45 tablet 0   aspirin 81 MG tablet Take 81 mg by mouth daily.     buPROPion (WELLBUTRIN XL) 300 MG 24 hr tablet Take 300 mg by mouth every morning.     clobetasol (TEMOVATE) 0.05 % external solution Apply 1 Application topically 2 (two) times daily.     cyanocobalamin 1000 MCG tablet Take 3,000 mcg by mouth daily.      desvenlafaxine (PRISTIQ) 100 MG 24 hr tablet Take 100 mg by mouth every morning.     hydrALAZINE (APRESOLINE) 25 MG tablet Take 1 tablet (25 mg total) by mouth 2 (two) times daily. (Patient taking differently: Take 25 mg by mouth 2 (two) times daily.) 180 tablet 3   levothyroxine (SYNTHROID, LEVOTHROID) 100 MCG tablet Take 112 mcg by mouth daily before breakfast.      losartan (COZAAR) 100 MG tablet Take 100 mg by mouth daily.     Potassium  Chloride ER 20 MEQ TBCR TAKE 1 TABLET BY MOUTH TWO  TIMES DAILY (Patient taking differently: Take 20 mEq by mouth daily as needed (low potassium).) 30 tablet 0   rosuvastatin (CRESTOR) 40 MG tablet Take 1 tablet (40 mg total) by mouth daily. Schedule an appointment for further refills 90 tablet 0   triamterene-hydrochlorothiazide (MAXZIDE) 75-50 MG tablet Take 1 tablet by mouth daily. Restart this after FU with PCP     valACYclovir (VALTREX) 500 MG tablet Take 500 mg by mouth daily as needed (for outbreaks).      VITAMIN D PO Take 1 capsule by mouth daily.     No current facility-administered medications for this visit.     Past Medical History:  Diagnosis Date   AKI (acute kidney injury) ([place]) 01/21/2019   Anxiety    GERD (gastroesophageal reflux disease)    Graves disease    with hyperthyroidism diagnosed at age 34 (treated with propylthiouracil for 2 years)   Hyperlipidemia    Hypertension    Hypothyroidism    Sleep disturbance     Past Surgical History:  Procedure Laterality Date   CARPAL TUNNEL RELEASE     CESAREAN SECTION     Rt ovary and fallopian tube removed due to cysts      Social History   Socioeconomic History   Marital status: Legally Separated  Spouse name: Not on file   Number of children: 1   Years of education: Not on file   Highest education level: Not on file  Occupational History   Not on file  Tobacco Use   Smoking status: Never   Smokeless tobacco: Never  Vaping Use   Vaping Use: Never used  Substance and Sexual Activity   Alcohol use: Yes   Drug use: No   Sexual activity: Not on file  Other Topics Concern   Not on file  Social History Narrative   Not on file   Social Determinants of Health   Financial Resource Strain: Not on file  Food Insecurity: Not on file  Transportation Needs: Not on file  Physical Activity: Not on file  Stress: Not on file  Social Connections: Not on file  Intimate Partner Violence: Not on file    Family  History  Problem Relation Age of Onset   Heart disease Mother        died of heart failure   Heart attack Mother 69   CAD Brother     ROS: no fevers or chills, productive cough, hemoptysis, dysphasia, odynophagia, melena, hematochezia, dysuria, hematuria, rash, seizure activity, orthopnea, PND, pedal edema, claudication. Remaining systems are negative.  Physical Exam: Well-developed well-nourished in no acute distress.  Skin is warm and dry.  HEENT is normal.  Neck is supple.  Chest is clear to auscultation with normal expansion.  Cardiovascular exam is regular rate and rhythm.  Abdominal exam nontender or distended. No masses palpated. Extremities show no edema. neuro grossly intact  ECG-normal sinus rhythm at a rate of 81, no significant ST changes.  Personally reviewed  A/P  1 coronary artery disease/new onset angina-patient describes new onset chest tightness/dyspnea on exertion over the past 6 months relieved with rest.  She has significant family history and markedly elevated calcium score that we have treated medically previously.  Feel cardiac catheterization is indicated.  The risk and benefits including myocardial infarction, CVA and death discussed and she agrees to proceed.  She has had a history of renal insufficiency but her most recent creatinine July 21, 2022 was 0.8.  We will hold her diuretic and potassium the day before and the day of her procedure and she will increase oral hydration.  Will repeat echocardiogram to reassess LV function.  Continue aspirin and statin.  2 hypertension-blood pressure controlled.  Continue present medical regimen.  Check potassium and renal function.  3 hyperlipidemia-continue Crestor.  Recent laboratory showed total cholesterol 126 with LDL 54.  4 history of renal insufficiency-patient is managed by nephrology.  Kirk Ruths, MD

## 2022-09-04 ENCOUNTER — Other Ambulatory Visit: Payer: Self-pay | Admitting: *Deleted

## 2022-09-04 ENCOUNTER — Encounter: Payer: Self-pay | Admitting: Cardiology

## 2022-09-04 ENCOUNTER — Ambulatory Visit (INDEPENDENT_AMBULATORY_CARE_PROVIDER_SITE_OTHER): Payer: PRIVATE HEALTH INSURANCE | Admitting: Cardiology

## 2022-09-04 VITALS — BP 126/89 | HR 81 | Ht 64.0 in | Wt 165.1 lb

## 2022-09-04 DIAGNOSIS — I25110 Atherosclerotic heart disease of native coronary artery with unstable angina pectoris: Secondary | ICD-10-CM

## 2022-09-04 DIAGNOSIS — E7800 Pure hypercholesterolemia, unspecified: Secondary | ICD-10-CM | POA: Diagnosis not present

## 2022-09-04 DIAGNOSIS — I2510 Atherosclerotic heart disease of native coronary artery without angina pectoris: Secondary | ICD-10-CM

## 2022-09-04 DIAGNOSIS — I2583 Coronary atherosclerosis due to lipid rich plaque: Secondary | ICD-10-CM

## 2022-09-04 DIAGNOSIS — I200 Unstable angina: Secondary | ICD-10-CM

## 2022-09-04 DIAGNOSIS — I10 Essential (primary) hypertension: Secondary | ICD-10-CM | POA: Diagnosis not present

## 2022-09-04 MED ORDER — SODIUM CHLORIDE 0.9% FLUSH: 3.0000 mL | Freq: Two times a day (BID) | INTRAVENOUS | Status: DC

## 2022-09-04 NOTE — Patient Instructions (Addendum)
     Cardiac Catheterization   You are scheduled for a Cardiac Catheterization on Monday, February 26 with Dr. Sherren Mocha.  1. Please arrive at the Main Entrance A at [facility]: [place], [place] [address] on February 26 at 8:30 AM (This time is two hours before your procedure to ensure your preparation). Free valet parking service is available. You will check in at ADMITTING. The support person will be asked to wait in the waiting room.  It is OK to have someone drop you off and come back when you are ready to be discharged.        Special note: Every effort is made to have your procedure done on time. Please understand that emergencies sometimes delay scheduled procedures.   . 2. Diet: Do not eat solid foods after midnight.  You may have clear liquids until 5 AM the day of the procedure.  3. Labs: You will need to have blood drawn on TODAY  4. Medication instructions in preparation for your procedure:  DO NOT TAKE TRIAM/HCT THE DAY BEFORE AND THE DAY OF THE PROCEDURE   On the morning of your procedure, take Aspirin 81 mg and any morning medicines NOT listed above.  You may use sips of water.  5. Plan to go home the same day, you will only stay overnight if medically necessary. 6. You MUST have a responsible adult to drive you home. 7. An adult MUST be with you the first 24 hours after you arrive home. 8. Bring a current list of your medications, and the last time and date medication taken. 9. Bring ID and current insurance cards. 10.Please wear clothes that are easy to get on and off and wear slip-on shoes.  Thank you for allowing [place] to care for you!   -- [facility]  Your physician has requested that you have an echocardiogram. Echocardiography is a painless test that uses sound waves to create images of your heart. It provides your doctor with information about the size and shape of your heart and how well your  heart's chambers and valves are working. This procedure takes approximately one hour. There are no restrictions for this procedure. Please do NOT wear cologne, perfume, aftershave, or lotions (deodorant is allowed). Please arrive 15 minutes prior to your appointment time. [place]   Your physician recommends that you schedule a follow-up appointment in: [place]

## 2022-09-05 LAB — BASIC METABOLIC PANEL
BUN/Creatinine Ratio: 15 (ref 12–28)
BUN: 14 mg/dL (ref 8–27)
CO2: 19 mmol/L — ABNORMAL LOW (ref 20–29)
Calcium: 9.6 mg/dL (ref 8.7–10.3)
Chloride: 103 mmol/L (ref 96–106)
Creatinine, Ser: 0.92 mg/dL (ref 0.57–1.00)
Glucose: 113 mg/dL — ABNORMAL HIGH (ref 70–99)
Potassium: 4.2 mmol/L (ref 3.5–5.2)
Sodium: 139 mmol/L (ref 134–144)
eGFR: 71 mL/min/{1.73_m2} (ref >59)

## 2022-09-05 LAB — CBC
Hematocrit: 45.6 % (ref 34.0–46.6)
Hemoglobin: 15.7 g/dL (ref 11.1–15.9)
MCH: 32 pg (ref 26.6–33.0)
MCHC: 34.4 g/dL (ref 31.5–35.7)
MCV: 93 fL (ref 79–97)
Platelets: 304 10*3/uL (ref 150–450)
RBC: 4.91 x10E6/uL (ref 3.77–5.28)
RDW: 11.7 % (ref 11.7–15.4)
WBC: 8 10*3/uL (ref 3.4–10.8)

## 2022-09-07 ENCOUNTER — Telehealth: Payer: Self-pay | Admitting: *Deleted

## 2022-09-07 NOTE — Telephone Encounter (Signed)
Cardiac Catheterization scheduled at [facility] for: Monday September 11, 2022 10:30 AM Arrival time and place: [place] Entrance A at: 8:30 AM  Nothing to eat after midnight prior to procedure, clear liquids until 5 AM day of procedure.   Medication instructions: -Hold:  Triamterene/HCT/KCl-AM of procedure -Other usual morning medications can be taken with sips of water including aspirin 81 mg.  Confirmed patient has responsible adult to drive home post procedure and be with patient first 24 hours after arriving home.  Patient reports no new symptoms concerning for COVID-19 in the past 10 days.  Reviewed procedure instructions with patient.

## 2022-09-11 ENCOUNTER — Other Ambulatory Visit (HOSPITAL_COMMUNITY): Payer: Self-pay

## 2022-09-11 ENCOUNTER — Ambulatory Visit (HOSPITAL_COMMUNITY)
Admission: RE | Admit: 2022-09-11 | Discharge: 2022-09-11 | Disposition: A | Discharge status: Home / Self Care | Payer: PRIVATE HEALTH INSURANCE | Attending: Cardiovascular Disease | Admitting: Cardiovascular Disease

## 2022-09-11 ENCOUNTER — Encounter (HOSPITAL_COMMUNITY)
Admission: RE | Disposition: A | Discharge status: Home / Self Care | Payer: Self-pay | Source: Home / Self Care | Attending: Cardiovascular Disease

## 2022-09-11 ENCOUNTER — Other Ambulatory Visit: Payer: Self-pay

## 2022-09-11 DIAGNOSIS — E785 Hyperlipidemia, unspecified: Secondary | ICD-10-CM | POA: Diagnosis not present

## 2022-09-11 DIAGNOSIS — Z79899 Other long term (current) drug therapy: Secondary | ICD-10-CM | POA: Insufficient documentation

## 2022-09-11 DIAGNOSIS — Z8249 Family history of ischemic heart disease and other diseases of the circulatory system: Secondary | ICD-10-CM | POA: Diagnosis not present

## 2022-09-11 DIAGNOSIS — I25119 Atherosclerotic heart disease of native coronary artery with unspecified angina pectoris: Secondary | ICD-10-CM

## 2022-09-11 DIAGNOSIS — Z7982 Long term (current) use of aspirin: Secondary | ICD-10-CM | POA: Insufficient documentation

## 2022-09-11 DIAGNOSIS — I2584 Coronary atherosclerosis due to calcified coronary lesion: Secondary | ICD-10-CM | POA: Insufficient documentation

## 2022-09-11 DIAGNOSIS — I200 Unstable angina: Secondary | ICD-10-CM

## 2022-09-11 DIAGNOSIS — Z955 Presence of coronary angioplasty implant and graft: Secondary | ICD-10-CM

## 2022-09-11 DIAGNOSIS — I2582 Chronic total occlusion of coronary artery: Secondary | ICD-10-CM | POA: Diagnosis not present

## 2022-09-11 DIAGNOSIS — I10 Essential (primary) hypertension: Secondary | ICD-10-CM | POA: Diagnosis not present

## 2022-09-11 HISTORY — PX: CORONARY STENT INTERVENTION: CATH118234

## 2022-09-11 HISTORY — PX: LEFT HEART CATH AND CORONARY ANGIOGRAPHY: CATH118249

## 2022-09-11 LAB — POCT ACTIVATED CLOTTING TIME
Activated Clotting Time: 244 seconds
Activated Clotting Time: 260 seconds
Activated Clotting Time: 282 seconds

## 2022-09-11 SURGERY — LEFT HEART CATH AND CORONARY ANGIOGRAPHY
Anesthesia: LOCAL

## 2022-09-11 MED ORDER — HEPARIN SODIUM (PORCINE) 1000 UNIT/ML IJ SOLN
INTRAMUSCULAR | Status: DC | PRN
  Administered 2022-09-11: 3000 [IU] via INTRAVENOUS
  Administered 2022-09-11: 4000 [IU] via INTRAVENOUS
  Administered 2022-09-11 (×2): 3000 [IU] via INTRAVENOUS

## 2022-09-11 MED ORDER — SODIUM CHLORIDE 0.9% FLUSH: 3.0000 mL | INTRAVENOUS | Status: DC | PRN

## 2022-09-11 MED ORDER — HYDRALAZINE HCL 20 MG/ML IJ SOLN: 10.0000 mg | INTRAMUSCULAR | Status: DC | PRN

## 2022-09-11 MED ORDER — NITROGLYCERIN 0.2 MG/ML ON CALL CATH LAB
INTRAVENOUS | Status: AC
  Filled 2022-09-11: qty 1

## 2022-09-11 MED ORDER — SODIUM CHLORIDE 0.9% FLUSH: 3.0000 mL | Freq: Two times a day (BID) | INTRAVENOUS | Status: DC

## 2022-09-11 MED ORDER — FAMOTIDINE IN NACL 20-0.9 MG/50ML-% IV SOLN
INTRAVENOUS | Status: AC
  Filled 2022-09-11: qty 50

## 2022-09-11 MED ORDER — FENTANYL CITRATE (PF) 100 MCG/2ML IJ SOLN
INTRAMUSCULAR | Status: DC | PRN
  Administered 2022-09-11 (×2): 25 ug via INTRAVENOUS

## 2022-09-11 MED ORDER — HEPARIN (PORCINE) IN NACL 1000-0.9 UT/500ML-% IV SOLN
INTRAVENOUS | Status: DC | PRN
  Administered 2022-09-11 (×3): 500 mL

## 2022-09-11 MED ORDER — SODIUM CHLORIDE 0.9 % WEIGHT BASED INFUSION: 1.0000 mL/kg/h | INTRAVENOUS | Status: DC

## 2022-09-11 MED ORDER — HEPARIN SODIUM (PORCINE) 1000 UNIT/ML IJ SOLN
INTRAMUSCULAR | Status: AC
  Filled 2022-09-11: qty 10

## 2022-09-11 MED ORDER — NITROGLYCERIN 0.4 MG SL SUBL: 0.4000 mg | SUBLINGUAL_TABLET | SUBLINGUAL | 2 refills | Status: AC | PRN

## 2022-09-11 MED ORDER — VERAPAMIL HCL 2.5 MG/ML IV SOLN
INTRAVENOUS | Status: AC
  Filled 2022-09-11: qty 2

## 2022-09-11 MED ORDER — SODIUM CHLORIDE 0.9 % WEIGHT BASED INFUSION: 3.0000 mL/kg/h | INTRAVENOUS | Status: AC

## 2022-09-11 MED ORDER — IOHEXOL 350 MG/ML SOLN
INTRAVENOUS | Status: DC | PRN
  Administered 2022-09-11: 130 mL

## 2022-09-11 MED ORDER — VERAPAMIL HCL 2.5 MG/ML IV SOLN
INTRAVENOUS | Status: DC | PRN
  Administered 2022-09-11: 10 mL via INTRA_ARTERIAL

## 2022-09-11 MED ORDER — LIDOCAINE HCL (PF) 1 % IJ SOLN
INTRAMUSCULAR | Status: AC
  Filled 2022-09-11: qty 30

## 2022-09-11 MED ORDER — ASPIRIN 81 MG PO CHEW: 81.0000 mg | CHEWABLE_TABLET | ORAL | Status: DC

## 2022-09-11 MED ORDER — CLOPIDOGREL BISULFATE 75 MG PO TABS: 75.0000 mg | ORAL_TABLET | Freq: Every day | ORAL | 0 refills | Status: DC

## 2022-09-11 MED ORDER — MORPHINE SULFATE (PF) 2 MG/ML IV SOLN: 2.0000 mg | INTRAVENOUS | Status: DC | PRN

## 2022-09-11 MED ORDER — NITROGLYCERIN 0.4 MG SL SUBL
0.4000 mg | SUBLINGUAL_TABLET | SUBLINGUAL | 2 refills | Status: DC | PRN
  Filled 2022-09-11: qty 25, 7d supply, fill #0

## 2022-09-11 MED ORDER — MIDAZOLAM HCL 2 MG/2ML IJ SOLN
INTRAMUSCULAR | Status: AC
  Filled 2022-09-11: qty 2

## 2022-09-11 MED ORDER — CLOPIDOGREL BISULFATE 75 MG PO TABS: 75.0000 mg | ORAL_TABLET | Freq: Every day | ORAL | Status: DC

## 2022-09-11 MED ORDER — OXYCODONE HCL 5 MG PO TABS: 5.0000 mg | ORAL_TABLET | ORAL | Status: DC | PRN

## 2022-09-11 MED ORDER — NITROGLYCERIN 1 MG/10 ML FOR IR/CATH LAB
INTRA_ARTERIAL | Status: AC
  Filled 2022-09-11: qty 10

## 2022-09-11 MED ORDER — ACETAMINOPHEN 325 MG PO TABS: 650.0000 mg | ORAL_TABLET | ORAL | Status: DC | PRN

## 2022-09-11 MED ORDER — LIDOCAINE HCL (PF) 1 % IJ SOLN
INTRAMUSCULAR | Status: DC | PRN
  Administered 2022-09-11: 2 mL

## 2022-09-11 MED ORDER — CLOPIDOGREL BISULFATE 75 MG PO TABS
75.0000 mg | ORAL_TABLET | Freq: Every day | ORAL | 1 refills | Status: DC
  Filled 2022-09-11: qty 30, 30d supply, fill #0

## 2022-09-11 MED ORDER — SODIUM CHLORIDE 0.9 % IV SOLN: 250.0000 mL | INTRAVENOUS | Status: DC | PRN

## 2022-09-11 MED ORDER — CLOPIDOGREL BISULFATE 300 MG PO TABS
ORAL_TABLET | ORAL | Status: AC
  Filled 2022-09-11: qty 2

## 2022-09-11 MED ORDER — MIDAZOLAM HCL 2 MG/2ML IJ SOLN
INTRAMUSCULAR | Status: DC | PRN
  Administered 2022-09-11: 1 mg via INTRAVENOUS
  Administered 2022-09-11: 2 mg via INTRAVENOUS

## 2022-09-11 MED ORDER — FAMOTIDINE IN NACL 20-0.9 MG/50ML-% IV SOLN
INTRAVENOUS | Status: DC | PRN
  Administered 2022-09-11: 20 mg via INTRAVENOUS

## 2022-09-11 MED ORDER — NITROGLYCERIN 1 MG/10 ML FOR IR/CATH LAB
INTRA_ARTERIAL | Status: DC | PRN
  Administered 2022-09-11 (×2): 150 ug via INTRACORONARY

## 2022-09-11 MED ORDER — DIAZEPAM 5 MG PO TABS: 5.0000 mg | ORAL_TABLET | Freq: Four times a day (QID) | ORAL | Status: DC | PRN

## 2022-09-11 MED ORDER — FENTANYL CITRATE (PF) 100 MCG/2ML IJ SOLN
INTRAMUSCULAR | Status: AC
  Filled 2022-09-11: qty 2

## 2022-09-11 MED ORDER — ONDANSETRON HCL 4 MG/2ML IJ SOLN: 4.0000 mg | Freq: Four times a day (QID) | INTRAMUSCULAR | Status: DC | PRN

## 2022-09-11 MED ORDER — CLOPIDOGREL BISULFATE 300 MG PO TABS
ORAL_TABLET | ORAL | Status: DC | PRN
  Administered 2022-09-11: 600 mg via ORAL

## 2022-09-11 MED ORDER — LABETALOL HCL 5 MG/ML IV SOLN: 10.0000 mg | INTRAVENOUS | Status: DC | PRN

## 2022-09-11 SURGICAL SUPPLY — 27 items
BALL SAPPHIRE NC24 3.0X12 (BALLOONS) ×1
BALLN ~~LOC~~ EMERGE MR 3.25X12 (BALLOONS) ×1
BALLN ~~LOC~~ EUPHORA RX 3.75X12 (BALLOONS) ×1
BALLOON SAPPHIRE NC24 3.0X12 (BALLOONS) IMPLANT
BALLOON TAKERU 2.0X12 (BALLOONS) IMPLANT
BALLOON ~~LOC~~ EMERGE MR 3.25X12 (BALLOONS) IMPLANT
BALLOON ~~LOC~~ EUPHORA RX 3.75X12 (BALLOONS) IMPLANT
BAND CMPR LRG ZPHR (HEMOSTASIS) ×1
BAND ZEPHYR COMPRESS 30 LONG (HEMOSTASIS) IMPLANT
CATH 5FR JL3.5 JR4 ANG PIG MP (CATHETERS) IMPLANT
CATH LAUNCHER 6FR JR4 (CATHETERS) IMPLANT
CATH VISTA GUIDE 6FR XBLAD3.5 (CATHETERS) IMPLANT
GLIDESHEATH SLEND SS 6F .021 (SHEATH) IMPLANT
GUIDEWIRE INQWIRE 1.5J.035X260 (WIRE) IMPLANT
INQWIRE 1.5J .035X260CM (WIRE) ×1
KIT ENCORE 26 ADVANTAGE (KITS) IMPLANT
KIT HEART LEFT (KITS) ×1 IMPLANT
PACK CARDIAC CATHETERIZATION (CUSTOM PROCEDURE TRAY) ×1 IMPLANT
STENT SYNERGY XD 3.0X16 (Permanent Stent) IMPLANT
STENT SYNERGY XD 3.50X16 (Permanent Stent) IMPLANT
SYNERGY XD 3.0X16 (Permanent Stent) ×1 IMPLANT
SYNERGY XD 3.50X16 (Permanent Stent) ×1 IMPLANT
SYR MEDRAD MARK 7 150ML (SYRINGE) ×1 IMPLANT
TRANSDUCER W/STOPCOCK (MISCELLANEOUS) ×1 IMPLANT
TUBING CIL FLEX 10 FLL-RA (TUBING) ×1 IMPLANT
VALVE GUARDIAN II ~~LOC~~ HEMO (MISCELLANEOUS) IMPLANT
WIRE COUGAR XT STRL 190CM (WIRE) IMPLANT

## 2022-09-11 NOTE — Progress Notes (Signed)
Pt has clopidogrel rx from [facility] in the room with sister.  Explained the importance of adherence and how to obtain refills.  [place] Crane, Roylene Reason, BCCP Clinical Pharmacist  09/11/2022 2:15 PM   [facility] pharmacy phone numbers are listed on [email or web address]

## 2022-09-11 NOTE — Progress Notes (Signed)
Cardiac rehab came by and educated pt on the importance of antiplatelet medication, NTG, exercise guidelines, risk factors, restrictions, heart healthy diet and CRP II. Pt referred to [facility] CRP.  09/11/22  1:46 PM  Service time is from [address] to [address], [place]

## 2022-09-11 NOTE — Progress Notes (Signed)
Received report from [facility] and will assume care at this time.

## 2022-09-11 NOTE — Interval H&P Note (Signed)
History and Physical Interval Note:  09/11/2022 10:37 AM  Kathryn [name]  has presented today for surgery, with the diagnosis of CAD.  The various methods of treatment have been discussed with the patient and family. After consideration of risks, benefits and other options for treatment, the patient has consented to  Procedure(s): LEFT HEART CATH AND CORONARY ANGIOGRAPHY (N/A) as a surgical intervention.  The patient's history has been reviewed, patient examined, no change in status, stable for surgery.  I have reviewed the patient's chart and labs.  Questions were answered to the patient's satisfaction.     Sherren Mocha

## 2022-09-11 NOTE — Discharge Summary (Signed)
Discharge Summary for Same Day PCI   Patient ID: Kathryn [name] MRN: [id number]; DOB: 09-09-1960  Admit date: 09/11/2022 Discharge date: 09/11/2022  Primary Care Provider: Jettie Booze, NP  Primary Cardiologist: Kirk Ruths, MD  Primary Electrophysiologist:  None   Discharge Diagnoses    Active Problems:   Coronary artery disease involving native coronary artery of native heart with angina pectoris [facility])  Diagnostic Studies/Procedures    Cardiac Catheterization 09/11/2022:  1.  Severe near subtotal occlusion of the mid LAD with heavy calcification, lesion treated successfully with PTCA and stenting using a 3.0 x 16 mm Synergy DES, reducing 99% stenosis to 0% post PCI. 2.  Severe mid RCA stenosis, treated successfully with a 3.5 x 16 mm Synergy DES 3.  Mild nonobstructive plaquing in the left main and left circumflex appropriate for medical therapy 4.  Normal LVEDP   Recommendations: Aggressive risk reduction measures, DAPT with aspirin and clopidogrel minimum 6 months, favor 12 months if tolerated due to complexity of CAD, same-day PCI discharge if criteria met.  Diagnostic Dominance: Right  Intervention   _____________   History of Present Illness     Kathryn [name] is a 62 y.o. female with past medical history of hypertension and carotid artery disease who was recently seen in the office with Dr. Stanford Breed 2/19.  Echocardiogram July 2020 showed normal LV function.  She reported a family history of coronary disease in both her mother and brother with MI in their 16s.  Had renal Dopplers 03/2019 which showed 1 to 59% bilateral renal artery stenosis.  Had a nuclear study 03/2019 which showed an EF of 56% and no ischemia.  Coronary calcium score 05/2020 of 2813 which was 99 percentile.  Reported a 75-monthhistory of progressive dyspnea and chest tightness with activity.  She was set up for outpatient cardiac catheterization.  Hospital Course     The patient underwent cardiac  cath as noted above with PCI/DES x1 to mLAD, along with severe mRCA treated with PCI/DES x1. Plan for DAPT with ASA/plavix for at least 6 months, ideally one year if tolerated with complexity of CAD. The patient was seen by cardiac rehab while in short stay. There were no observed complications post cath. Radial cath site was re-evaluated prior to discharge and found to be stable without any complications. Instructions/precautions regarding cath site care were given prior to discharge.  Kathryn YANCEwas seen by Dr. CBurt Knackand determined stable for discharge home. Follow up with our office has been arranged. Medications are listed below. Pertinent changes include addition of plavix, nitro.    _____________  Cath/PCI Registry Performance & Quality Measures: Aspirin prescribed? - Yes ADP Receptor Inhibitor (Plavix/Clopidogrel, Brilinta/Ticagrelor or Effient/Prasugrel) prescribed (includes medically managed patients)? - Yes High Intensity Statin (Lipitor 40-'80mg'$  or Crestor 20-'40mg'$ ) prescribed? - Yes For EF <40%, was ACEI/ARB prescribed? - Not Applicable (EF >/= 4AB-123456789 For EF <40%, Aldosterone Antagonist (Spironolactone or Eplerenone) prescribed? - Not Applicable (EF >/= 4AB-123456789 Cardiac Rehab Phase II ordered (Included Medically managed Patients)? - Yes  _____________   Discharge Vitals Blood pressure 132/77, pulse 63, temperature (!) 97.2 F (36.2 C), temperature source Temporal, resp. rate 19, height '5\' 4"'$  (1.626 m), weight 74.8 kg, SpO2 98 %.  Filed Weights   09/11/22 0900 09/11/22 0924  Weight: 67.6 kg 74.8 kg    Last Labs & Radiologic Studies    CBC No results for input(s): "WBC", "NEUTROABS", "HGB", "HCT", "MCV", "PLT" in the last 72 hours. Basic  Metabolic Panel No results for input(s): "NA", "K", "CL", "CO2", "GLUCOSE", "BUN", "CREATININE", "CALCIUM", "MG", "PHOS" in the last 72 hours. Liver Function Tests No results for input(s): "AST", "ALT", "ALKPHOS", "BILITOT", "PROT", "ALBUMIN"  in the last 72 hours. No results for input(s): "LIPASE", "AMYLASE" in the last 72 hours. High Sensitivity Troponin:   No results for input(s): "TROPONINIHS" in the last 720 hours.  BNP Invalid input(s): "POCBNP" D-Dimer No results for input(s): "DDIMER" in the last 72 hours. Hemoglobin A1C No results for input(s): "HGBA1C" in the last 72 hours. Fasting Lipid Panel No results for input(s): "CHOL", "HDL", "LDLCALC", "TRIG", "CHOLHDL", "LDLDIRECT" in the last 72 hours. Thyroid Function Tests No results for input(s): "TSH", "T4TOTAL", "T3FREE", "THYROIDAB" in the last 72 hours.  Invalid input(s): "FREET3" _____________  CARDIAC CATHETERIZATION  Result Date: 09/11/2022 1.  Severe near subtotal occlusion of the mid LAD with heavy calcification, lesion treated successfully with PTCA and stenting using a 3.0 x 16 mm Synergy DES, reducing 99% stenosis to 0% post PCI. 2.  Severe mid RCA stenosis, treated successfully with a 3.5 x 16 mm Synergy DES 3.  Mild nonobstructive plaquing in the left main and left circumflex appropriate for medical therapy 4.  Normal LVEDP Recommendations: Aggressive risk reduction measures, DAPT with aspirin and clopidogrel minimum 6 months, favor 12 months if tolerated due to complexity of CAD, same-day PCI discharge if criteria met.    Disposition   Pt is being discharged home today in good condition.  Follow-up Plans & Appointments     Follow-up Information     Deberah Pelton, NP Follow up on 09/25/2022.   Specialty: Cardiology Why: at 10:05am for your follow up appt with Dr. Lonia Skinner' NP [place] information: [address] [place] [address] [phone]                Discharge Instructions     Amb Referral to Cardiac Rehabilitation   Complete by: As directed    Diagnosis:  Coronary Stents PTCA     After initial evaluation and assessments completed: Virtual Based Care may be provided alone or in conjunction with Phase 2  Cardiac Rehab based on patient barriers.: Yes   Intensive Cardiac Rehabilitation (ICR) [place] location only OR Traditional Cardiac Rehabilitation (TCR) *If criteria for ICR are not met will enroll in TCR [facility] only): Yes        Discharge Medications   Allergies as of 09/11/2022       Reactions   Hydrocortisone Rash   Flagyl [metronidazole] Nausea Only   Nausea and Headache   Tetracyclines & Related Nausea And Vomiting        Medication List     TAKE these medications    Adderall XR 30 MG 24 hr capsule Generic drug: amphetamine-dextroamphetamine Take 30 mg by mouth daily.   albuterol 108 (90 Base) MCG/ACT inhaler Commonly known as: VENTOLIN HFA Inhale 1 puff into the lungs every 6 (six) hours as needed for wheezing or shortness of breath.   ALPRAZolam 0.5 MG tablet Commonly known as: XANAX Take 0.5 mg by mouth at bedtime as needed for anxiety.   amLODipine 5 MG tablet Commonly known as: NORVASC TAKE 1 TABLET BY MOUTH  DAILY   aspirin 81 MG tablet Take 81 mg by mouth daily.   buPROPion 300 MG 24 hr tablet Commonly known as: WELLBUTRIN XL Take 300 mg by mouth every morning.   cholecalciferol 25 MCG (1000 UNIT) tablet Commonly known as: VITAMIN D3 Take  1,000 Units by mouth daily.   clobetasol 0.05 % external solution Commonly known as: TEMOVATE Apply 1 Application topically 2 (two) times daily as needed (irritation).   clopidogrel 75 MG tablet Commonly known as: Plavix Take 1 tablet (75 mg total) by mouth daily.   cyanocobalamin 1000 MCG tablet Commonly known as: VITAMIN B12 Take 3,000 mcg by mouth daily.   desvenlafaxine 100 MG 24 hr tablet Commonly known as: PRISTIQ Take 100 mg by mouth every morning.   hydrALAZINE 25 MG tablet Commonly known as: APRESOLINE Take 1 tablet (25 mg total) by mouth 2 (two) times daily.   levothyroxine 112 MCG tablet Commonly known as: SYNTHROID Take 112 mcg by mouth daily before breakfast.   losartan 100 MG  tablet Commonly known as: COZAAR Take 100 mg by mouth daily.   Lumify 0.025 % Soln Generic drug: Brimonidine Tartrate Place 1 drop into both eyes 4 (four) times daily as needed (dry eyes).   MAGNESIUM GLYCINATE PO Take 420 mg by mouth daily.   Melatonin 10 MG Chew Chew 10 mg by mouth at bedtime.   nitroGLYCERIN 0.4 MG SL tablet Commonly known as: Nitrostat Place 1 tablet (0.4 mg total) under the tongue every 5 (five) minutes as needed.   Potassium Chloride ER 20 MEQ Tbcr TAKE 1 TABLET BY MOUTH TWO  TIMES DAILY   rosuvastatin 40 MG tablet Commonly known as: CRESTOR Take 1 tablet (40 mg total) by mouth daily. Schedule an appointment for further refills   triamterene-hydrochlorothiazide 75-50 MG tablet Commonly known as: MAXZIDE Take 1 tablet by mouth daily. Restart this after FU with PCP   valACYclovir 500 MG tablet Commonly known as: VALTREX Take 500 mg by mouth daily as needed (for outbreaks).           Allergies Allergies  Allergen Reactions   Hydrocortisone Rash   Flagyl [Metronidazole] Nausea Only    Nausea and Headache   Tetracyclines & Related Nausea And Vomiting    Outstanding Labs/Studies   N/a   Duration of Discharge Encounter   Greater than 30 minutes including physician time.  Signed, Reino Bellis, NP 09/11/2022, 3:02 PM

## 2022-09-11 NOTE — Discharge Instructions (Signed)

## 2022-09-12 ENCOUNTER — Encounter (HOSPITAL_COMMUNITY): Payer: Self-pay | Admitting: Cardiovascular Disease

## 2022-09-15 ENCOUNTER — Other Ambulatory Visit (HOSPITAL_COMMUNITY): Payer: Self-pay

## 2022-09-24 NOTE — Progress Notes (Unsigned)
Cardiology Office Note:    Date:  09/25/2022   ID:  Kathryn [name], DOB Jun 24, 1961, MRN [id number]  PCP:  Jettie Booze, NP   [place] Providers Cardiologist:  Kirk Ruths, MD     Referring MD: Jettie Booze, NP   Follow up s/p LHC   History of Present Illness:    Kathryn [name] is a 62 y.o. female with a hx of hypertension, LVH, CAD (PTCA and DES mid LAD, DES to mid RCA 09/11/22), GERD, bilateral renal artery stenosis (2020), carotid artery disease (mild in 2015), hypothyroidism, optic neuritis, HLD, anxiety.  She initially establish care with our practice in 2014 with Dr. Marlou Porch for hypertension.  In November 2021 she had a coronary calcium score of 2813, placing her in the 99th percentile for age, sex, race matched controls.  At this time, she was asymptomatic from a cardiac standpoint.  She was evaluated on 09/04/2022 by Dr. Stanford Breed, at that time she was having progressive dyspnea, chest tightness that was relieved with rest that had worsened over the last 6 months.  The decision was made to proceed with a left heart catheterization. She underwent a left heart catheterization on 09/11/2022 which showed near subtotal occlusion of the mid [facility] with heavy calcification, treated successfully with PCI and stenting using DES reducing 99% stenosis to 0% post PCI, severe mid RCA stenosis, treated with DES, mild nonobstructive plaque in the left main and left circumflex appropriate for medical therapy.  Aggressive risk reduction measures, DAPT with aspirin and Plavix for minimum of 6 months, optimally 12 months.  She presents today for follow-up after her left heart catheterization on 09/04/2022.  She reports that she has been doing well, today she was able to take her trash cans down her driveway to the curb, which previously caused her chest discomfort.  She did report a day of her blood pressure being elevated and her heart rate being in the 90s following her discharge however  her blood pressure and heart rate has since normalized, she states she feels there could be a component of anxiety related to that.  She denies chest pain, palpitations, dyspnea, pnd, orthopnea, n, v, dizziness, syncope, edema, weight gain, or early satiety.    Past Medical History:  Diagnosis Date   AKI (acute kidney injury) ([place]) 01/21/2019   Anxiety    GERD (gastroesophageal reflux disease)    Graves disease    with hyperthyroidism diagnosed at age 82 (treated with propylthiouracil for 2 years)   Hyperlipidemia    Hypertension    Hypothyroidism    Sleep disturbance     Past Surgical History:  Procedure Laterality Date   CARPAL TUNNEL RELEASE     CESAREAN SECTION     CORONARY STENT INTERVENTION N/A 09/11/2022   Procedure: CORONARY STENT INTERVENTION;  Surgeon: Sherren Mocha, MD;  Location: [facility];  Service: Cardiovascular;  Laterality: N/A;   LEFT HEART CATH AND CORONARY ANGIOGRAPHY N/A 09/11/2022   Procedure: LEFT HEART CATH AND CORONARY ANGIOGRAPHY;  Surgeon: Sherren Mocha, MD;  Location: [facility];  Service: Cardiovascular;  Laterality: N/A;   Rt ovary and fallopian tube removed due to cysts      Current Medications: Current Meds  Medication Sig   ADDERALL XR 30 MG 24 hr capsule Take 30 mg by mouth daily.   albuterol (VENTOLIN HFA) 108 (90 Base) MCG/ACT inhaler Inhale 1 puff into the lungs every 6 (six) hours as needed for wheezing or shortness  of breath.   ALPRAZolam (XANAX) 0.5 MG tablet Take 0.5 mg by mouth at bedtime as needed for anxiety.   amLODipine (NORVASC) 5 MG tablet TAKE 1 TABLET BY MOUTH  DAILY   aspirin 81 MG tablet Take 81 mg by mouth daily.   Brimonidine Tartrate (LUMIFY) 0.025 % SOLN Place 1 drop into both eyes 4 (four) times daily as needed (dry eyes).   buPROPion (WELLBUTRIN XL) 300 MG 24 hr tablet Take 300 mg by mouth every morning.   cholecalciferol (VITAMIN D3) 25 MCG (1000 UNIT) tablet Take 1,000 Units by mouth daily.    clobetasol (TEMOVATE) 0.05 % external solution Apply 1 Application topically 2 (two) times daily as needed (irritation).   cyanocobalamin (VITAMIN B12) 1000 MCG tablet Take 3,000 mcg by mouth daily.   desvenlafaxine (PRISTIQ) 100 MG 24 hr tablet Take 100 mg by mouth every morning.   hydrALAZINE (APRESOLINE) 25 MG tablet Take 1 tablet (25 mg total) by mouth 2 (two) times daily.   levothyroxine (SYNTHROID) 112 MCG tablet Take 112 mcg by mouth daily before breakfast.   losartan (COZAAR) 100 MG tablet Take 100 mg by mouth daily.   MAGNESIUM GLYCINATE PO Take 420 mg by mouth daily.   Melatonin 10 MG CHEW Chew 10 mg by mouth at bedtime.   nitroGLYCERIN (NITROSTAT) 0.4 MG SL tablet Place 1 tablet (0.4 mg total) under the tongue every 5 (five) minutes as needed.   Potassium Chloride ER 20 MEQ TBCR TAKE 1 TABLET BY MOUTH TWO  TIMES DAILY   rosuvastatin (CRESTOR) 40 MG tablet Take 1 tablet (40 mg total) by mouth daily. Schedule an appointment for further refills   triamterene-hydrochlorothiazide (MAXZIDE) 75-50 MG tablet Take 1 tablet by mouth daily. Restart this after FU with PCP   valACYclovir (VALTREX) 500 MG tablet Take 500 mg by mouth daily as needed (for outbreaks).    [DISCONTINUED] clopidogrel (PLAVIX) 75 MG tablet Take 1 tablet (75 mg total) by mouth daily.     Allergies:   Hydrocortisone, Flagyl [metronidazole], and Tetracyclines & related   Social History   Socioeconomic History   Marital status: Legally Separated    Spouse name: Not on file   Number of children: 1   Years of education: Not on file   Highest education level: Not on file  Occupational History   Not on file  Tobacco Use   Smoking status: Never   Smokeless tobacco: Never  Vaping Use   Vaping Use: Never used  Substance and Sexual Activity   Alcohol use: Yes   Drug use: No   Sexual activity: Not on file  Other Topics Concern   Not on file  Social History Narrative   Not on file   Social Determinants of Health    Financial Resource Strain: Not on file  Food Insecurity: Not on file  Transportation Needs: Not on file  Physical Activity: Not on file  Stress: Not on file  Social Connections: Not on file     Family History: The patient's family history includes CAD in her brother; Heart attack (age of onset: 58) in her mother; Heart disease in her mother.  ROS:   Please see the history of present illness.     All other systems reviewed and are negative.  EKGs/Labs/Other Studies Reviewed:    The following studies were reviewed today:  Cardiac Catheterization 09/11/2022:   1.  Severe near subtotal occlusion of the mid LAD with heavy calcification, lesion treated successfully with PTCA and stenting  using a 3.0 x 16 mm Synergy DES, reducing 99% stenosis to 0% post PCI. 2.  Severe mid RCA stenosis, treated successfully with a 3.5 x 16 mm Synergy DES 3.  Mild nonobstructive plaquing in the left main and left circumflex appropriate for medical therapy 4.  Normal LVEDP   Recommendations: Aggressive risk reduction measures, DAPT with aspirin and clopidogrel minimum 6 months, favor 12 months if tolerated due to complexity of CAD, same-day PCI discharge if criteria met.   Diagnostic Dominance: Right  Intervention    _____________  EKG:  EKG is  ordered today.  The ekg ordered today demonstrates SR, HR 78 bpm, consistent with prior EKG tracings.   Recent Labs: 09/04/2022: BUN 14; Creatinine, Ser 0.92; Hemoglobin 15.7; Platelets 304; Potassium 4.2; Sodium 139  Recent Lipid Panel    Component Value Date/Time   CHOL 118 06/30/2021 0923   TRIG 82 06/30/2021 0923   HDL 52 06/30/2021 0923   CHOLHDL 2.3 06/30/2021 0923   LDLCALC 50 06/30/2021 0923     Risk Assessment/Calculations:                Physical Exam:    VS:  BP 114/78   Pulse 78   Ht '5\' 4"'$  (1.626 m)   Wt 164 lb (74.4 kg)   SpO2 98%   BMI 28.15 kg/m     Wt Readings from Last 3 Encounters:  09/25/22 164 lb (74.4 kg)   09/11/22 165 lb (74.8 kg)  09/04/22 165 lb 1.9 oz (74.9 kg)     GEN:  Well nourished, well developed in no acute distress HEENT: Normal NECK: No JVD; No carotid bruits LYMPHATICS: No lymphadenopathy CARDIAC: RRR, no murmurs, rubs, gallops RESPIRATORY:  Clear to auscultation without rales, wheezing or rhonchi  ABDOMEN: Soft, non-tender, non-distended MUSCULOSKELETAL:  No edema; No deformity  SKIN: Warm and dry NEUROLOGIC:  Alert and oriented x 3 PSYCHIATRIC:  Normal affect   ASSESSMENT:    1. Coronary artery disease due to lipid rich plaque   2. Essential hypertension   3. Pure hypercholesterolemia   4. Bilateral carotid artery stenosis   5. Renal artery stenosis (HCC)    PLAN:    In order of problems listed above:  Coronary artery disease - LHC on 09/04/2022 PTCA and DES mid LAD, DES to mid RCA 09/11/22, Stable with no anginal symptoms. No indication for ischemic evaluation.Continue ASA, Plavix.   Hypertension - BP today well controlled 114/78, continue current antihypertensive medication regimen.   Hyperlipidemia - LDL was 54 on 07/24/22, well controlled and managed by her PCP, continue Crestor.  Bilateral carotid artery stenosis -bilateral ICA stenosis noted on carotid ultrasound on 06/02/2014, agreeable to repeat carotid doppler study.   Renal artery stenosis - renal ultrasound in 2020 showed 1-59% stenosis, followed by nephrology, continue to avoid nephrotoxic agents.   Disposition - carotid US, start cardiac rehab, return in 3 months with Dr. Stanford Breed.      Cardiac Rehabilitation Eligibility Assessment  The patient is ready to start cardiac rehabilitation from a cardiac standpoint.          Medication Adjustments/Labs and Tests Ordered: Current medicines are reviewed at length with the patient today.  Concerns regarding medicines are outlined above.  Orders Placed This Encounter  Procedures   EKG 12-Lead   VAS US CAROTID   Meds ordered this encounter   Medications   clopidogrel (PLAVIX) 75 MG tablet    Sig: Take 1 tablet (75 mg total) by mouth daily.  Dispense:  90 tablet    Refill:  3    Patient Instructions  Medication Instructions:  The current medical regimen is effective;  continue present plan and medications as directed. Please refer to the Current Medication list given to you today.  *If you need a refill on your cardiac medications before your next appointment, please call your pharmacy*  Lab Work: NONE If you have labs (blood work) drawn today and your tests are completely normal, you will receive your results only by: [place] (if you have MyChart) OR  A paper copy in the mail If you have any lab test that is abnormal or we need to change your treatment, we will call you to review the results.  Testing/Procedures: Your physician has requested that you have a carotid duplex. This test is an ultrasound of the carotid arteries in your neck. It looks at blood flow through these arteries that supply the brain with blood. Allow one hour for this exam. There are no restrictions or special instructions.  Follow-Up: At [facility], you and your health needs are our priority.  As part of our continuing mission to provide you with exceptional heart care, we have created designated Provider Care Teams.  These Care Teams include your primary Cardiologist (physician) and Advanced Practice Providers (APPs -  Physician Assistants and Nurse Practitioners) who all work together to provide you with the care you need, when you need it.  Your next appointment:   3-4 month(s)  Provider:   Kirk Ruths, MD  or Coletta Memos, FNP/Lavena Loretto, NP       Other Instructions     Signed, Trudi Ida, NP  09/25/2022 12:29 PM    [place]

## 2022-09-25 ENCOUNTER — Ambulatory Visit: Payer: PRIVATE HEALTH INSURANCE | Attending: General Practice | Admitting: Cardiology

## 2022-09-25 ENCOUNTER — Encounter: Payer: Self-pay | Admitting: General Practice

## 2022-09-25 VITALS — BP 114/78 | HR 78 | Ht 64.0 in | Wt 164.0 lb

## 2022-09-25 DIAGNOSIS — I2510 Atherosclerotic heart disease of native coronary artery without angina pectoris: Secondary | ICD-10-CM

## 2022-09-25 DIAGNOSIS — I10 Essential (primary) hypertension: Secondary | ICD-10-CM | POA: Diagnosis not present

## 2022-09-25 DIAGNOSIS — E7800 Pure hypercholesterolemia, unspecified: Secondary | ICD-10-CM | POA: Diagnosis not present

## 2022-09-25 DIAGNOSIS — I6523 Occlusion and stenosis of bilateral carotid arteries: Secondary | ICD-10-CM

## 2022-09-25 DIAGNOSIS — I701 Atherosclerosis of renal artery: Secondary | ICD-10-CM

## 2022-09-25 DIAGNOSIS — I2583 Coronary atherosclerosis due to lipid rich plaque: Secondary | ICD-10-CM

## 2022-09-25 MED ORDER — CLOPIDOGREL BISULFATE 75 MG PO TABS: 75.0000 mg | ORAL_TABLET | Freq: Every day | ORAL | 3 refills | Status: DC

## 2022-09-25 NOTE — Patient Instructions (Signed)
Medication Instructions:  The current medical regimen is effective;  continue present plan and medications as directed. Please refer to the Current Medication list given to you today.  *If you need a refill on your cardiac medications before your next appointment, please call your pharmacy*  Lab Work: NONE If you have labs (blood work) drawn today and your tests are completely normal, you will receive your results only by: [place] (if you have MyChart) OR  A paper copy in the mail If you have any lab test that is abnormal or we need to change your treatment, we will call you to review the results.  Testing/Procedures: Your physician has requested that you have a carotid duplex. This test is an ultrasound of the carotid arteries in your neck. It looks at blood flow through these arteries that supply the brain with blood. Allow one hour for this exam. There are no restrictions or special instructions.  Follow-Up: At [facility], you and your health needs are our priority.  As part of our continuing mission to provide you with exceptional heart care, we have created designated Provider Care Teams.  These Care Teams include your primary Cardiologist (physician) and Advanced Practice Providers (APPs -  Physician Assistants and Nurse Practitioners) who all work together to provide you with the care you need, when you need it.  Your next appointment:   3-4 month(s)  Provider:   Kirk Ruths, MD  or Coletta Memos, FNP/JENNIFER WOODY, NP       Other Instructions

## 2022-09-28 ENCOUNTER — Ambulatory Visit (HOSPITAL_COMMUNITY): Payer: PRIVATE HEALTH INSURANCE | Attending: Cardiology

## 2022-09-28 DIAGNOSIS — I2583 Coronary atherosclerosis due to lipid rich plaque: Secondary | ICD-10-CM | POA: Diagnosis not present

## 2022-09-28 DIAGNOSIS — I2510 Atherosclerotic heart disease of native coronary artery without angina pectoris: Secondary | ICD-10-CM

## 2022-09-29 LAB — ECHOCARDIOGRAM COMPLETE
Area-P 1/2: 2.38 cm2
S' Lateral: 3 cm

## 2022-10-03 ENCOUNTER — Ambulatory Visit (HOSPITAL_COMMUNITY)
Admission: RE | Admit: 2022-10-03 | Discharge: 2022-10-03 | Disposition: A | Discharge status: Home / Self Care | Payer: PRIVATE HEALTH INSURANCE | Source: Ambulatory Visit | Attending: Cardiology | Admitting: Cardiology

## 2022-10-03 DIAGNOSIS — I6523 Occlusion and stenosis of bilateral carotid arteries: Secondary | ICD-10-CM | POA: Diagnosis not present

## 2022-10-04 ENCOUNTER — Other Ambulatory Visit: Payer: Self-pay | Admitting: Cardiology

## 2022-10-04 DIAGNOSIS — E7800 Pure hypercholesterolemia, unspecified: Secondary | ICD-10-CM

## 2022-10-06 ENCOUNTER — Telehealth: Payer: Self-pay | Admitting: Cardiology

## 2022-10-06 DIAGNOSIS — E7800 Pure hypercholesterolemia, unspecified: Secondary | ICD-10-CM

## 2022-10-06 MED ORDER — ROSUVASTATIN CALCIUM 40 MG PO TABS: ORAL_TABLET | ORAL | 3 refills | Status: DC

## 2022-10-06 NOTE — Telephone Encounter (Signed)
*  STAT* If patient is at the pharmacy, call can be transferred to refill team.   1. Which medications need to be refilled? (please list name of each medication and dose if known) rosuvastatin (CRESTOR) 40 MG tablet   2. Which pharmacy/location (including street and city if local pharmacy) is medication to be sent to? [facility] [place], [place]   3. Do they need a 30 day or 90 day supply? [place]

## 2022-10-06 NOTE — Telephone Encounter (Signed)
Refill for Rosuvastatin has been sent to [facility].

## 2022-10-11 ENCOUNTER — Telehealth (HOSPITAL_COMMUNITY): Payer: Self-pay

## 2022-10-11 NOTE — Telephone Encounter (Signed)
Pt insurance is active and benefits verified through [employer]. Co-pay $0.00, DED $250.00/$215.12 met, out of pocket $1,250.00/$1,250.00 met, co-insurance 20%. No pre-authorization required. Bri/[employer], 10/11/22 @ 11:29AM, [id number]   How many CR sessions are covered? (36 sessions for TCR, 72 sessions for ICR)36 Is this a lifetime maximum or an annual maximum? Annual Has the member used any of these services to date? No Is there a time limit (weeks/months) on start of program and/or program completion? No     Will contact patient to see if she is interested in the Cardiac Rehab Program.

## 2022-10-11 NOTE — Telephone Encounter (Signed)
Called patient to see if she was interested in participating in the Cardiac Rehab Program. Patient stated yes. Patient will come in for orientation on 11/01/22 @ 10AM and will attend the 1:45PM exercise class. Went over insurance, patient verbalized understanding.   Tourist information centre manager.

## 2022-10-24 NOTE — Progress Notes (Signed)
HPI: FU hypertension and CAD. Renal Dopplers September 2020 showed 1 to 59% bilateral renal artery stenosis. Calcium score November 2021 2813 which was 99th percentile. Cardiac catheterization February 2024 showed subtotal occlusion of the mid LAD, severe stenosis in the mid right coronary artery and normal left ventricular end-diastolic pressure.  Patient had PCI of the LAD and right coronary artery with drug-eluting stents.  Echocardiogram March 2024 showed normal LV function, grade 1 diastolic dysfunction.  Carotid Dopplers March 2024 showed 1 to 39% bilateral stenosis.  Since last seen, she denies exertional chest pain, dyspnea on exertion or syncope.  Occasional "unusual feeling" in her chest lasting less than 1 minute at rest.  Unlike symptoms prior to stents.  Current Outpatient Medications  Medication Sig Dispense Refill   ADDERALL XR 30 MG 24 hr capsule Take 30 mg by mouth daily.     albuterol (VENTOLIN HFA) 108 (90 Base) MCG/ACT inhaler Inhale 1 puff into the lungs every 6 (six) hours as needed for wheezing or shortness of breath.     ALPRAZolam (XANAX) 0.5 MG tablet Take 0.5 mg by mouth at bedtime as needed for anxiety.     amLODipine (NORVASC) 5 MG tablet TAKE 1 TABLET BY MOUTH  DAILY 45 tablet 0   aspirin 81 MG tablet Take 81 mg by mouth daily.     Brimonidine Tartrate (LUMIFY) 0.025 % SOLN Place 1 drop into both eyes 4 (four) times daily as needed (dry eyes).     buPROPion (WELLBUTRIN XL) 300 MG 24 hr tablet Take 300 mg by mouth every morning.     cholecalciferol (VITAMIN D3) 25 MCG (1000 UNIT) tablet Take 1,000 Units by mouth daily.     clobetasol (TEMOVATE) 0.05 % external solution Apply 1 Application topically 2 (two) times daily as needed (irritation).     clopidogrel (PLAVIX) 75 MG tablet Take 1 tablet (75 mg total) by mouth daily. 90 tablet 3   cyanocobalamin (VITAMIN B12) 1000 MCG tablet Take 3,000 mcg by mouth daily.     desvenlafaxine (PRISTIQ) 100 MG 24 hr tablet Take  100 mg by mouth every morning.     hydrALAZINE (APRESOLINE) 25 MG tablet Take 1 tablet (25 mg total) by mouth 2 (two) times daily. 180 tablet 3   levothyroxine (SYNTHROID) 112 MCG tablet Take 112 mcg by mouth daily before breakfast.     losartan (COZAAR) 100 MG tablet Take 100 mg by mouth daily.     MAGNESIUM GLYCINATE PO Take 420 mg by mouth daily.     Melatonin 10 MG CHEW Chew 10 mg by mouth at bedtime.     nitroGLYCERIN (NITROSTAT) 0.4 MG SL tablet Place 1 tablet (0.4 mg total) under the tongue every 5 (five) minutes as needed. 25 tablet 2   Potassium Chloride ER 20 MEQ TBCR TAKE 1 TABLET BY MOUTH TWO  TIMES DAILY 30 tablet 0   rosuvastatin (CRESTOR) 40 MG tablet TAKE 1 TABLET DAILY (SCHEDULE AN APPOINTMENT FOR FURTHER REFILLS) 90 tablet 3   triamterene-hydrochlorothiazide (MAXZIDE) 75-50 MG tablet Take 1 tablet by mouth daily. Restart this after FU with PCP     valACYclovir (VALTREX) 500 MG tablet Take 500 mg by mouth daily as needed (for outbreaks).      No current facility-administered medications for this visit.     Past Medical History:  Diagnosis Date   AKI (acute kidney injury) 01/21/2019   Anxiety    GERD (gastroesophageal reflux disease)    Graves disease  with hyperthyroidism diagnosed at age 62 (treated with propylthiouracil for 2 years)   Hyperlipidemia    Hypertension    Hypothyroidism    Sleep disturbance     Past Surgical History:  Procedure Laterality Date   CARPAL TUNNEL RELEASE     CESAREAN SECTION     CORONARY STENT INTERVENTION N/A 09/11/2022   Procedure: CORONARY STENT INTERVENTION;  Surgeon: Tonny Bollmanooper, Michael, MD;  [facility];  Service: Cardiovascular;  Laterality: N/A;   LEFT HEART CATH AND CORONARY ANGIOGRAPHY N/A 09/11/2022   Procedure: LEFT HEART CATH AND CORONARY ANGIOGRAPHY;  Surgeon: Tonny Bollmanooper, Michael, MD;  [facility];  Service: Cardiovascular;  Laterality: N/A;   Rt ovary and fallopian tube removed due to cysts       Social History   Socioeconomic History   Marital status: Legally Separated    Spouse name: Not on file   Number of children: 1   Years of education: Not on file   Highest education level: Not on file  Occupational History   Not on file  Tobacco Use   Smoking status: Never   Smokeless tobacco: Never  Vaping Use   Vaping Use: Never used  Substance and Sexual Activity   Alcohol use: Yes   Drug use: No   Sexual activity: Not on file  Other Topics Concern   Not on file  Social History Narrative   Not on file   Social Determinants of Health   Financial Resource Strain: Not on file  Food Insecurity: Not on file  Transportation Needs: Not on file  Physical Activity: Not on file  Stress: Not on file  Social Connections: Not on file  Intimate Partner Violence: Not on file    Family History  Problem Relation Age of Onset   Heart disease Mother        died of heart failure   Heart attack Mother [id number]   CAD Brother     ROS: no fevers or chills, productive cough, hemoptysis, dysphasia, odynophagia, melena, hematochezia, dysuria, hematuria, rash, seizure activity, orthopnea, PND, pedal edema, claudication. Remaining systems are negative.  Physical Exam: Well-developed well-nourished in no acute distress.  Skin is warm and dry.  HEENT is normal.  Neck is supple.  Chest is clear to auscultation with normal expansion.  Cardiovascular exam is regular rate and rhythm.  Abdominal exam nontender or distended. No masses palpated. Extremities show no edema. neuro grossly intact  A/P  1 coronary artery disease-patient doing well following recent PCI.  She denies any recurrent exertional chest pain.  Continue aspirin, Plavix and statin.  Will plan to discontinue Plavix February 2025.  2 hypertension-blood pressure controlled.  She is on a very small dose of hydralazine.  I will discontinue that medication and increase amlodipine to 10 mg daily.  Follow blood pressure and adjust  as needed.  Check potassium and renal function.  3 hyperlipidemia-continue Crestor at present dose.  Check lipids and liver.  4 history of renal insufficiency-patient is followed by nephrology.  Olga MillersBrian Tuwana Kapaun, MD

## 2022-10-30 ENCOUNTER — Ambulatory Visit (INDEPENDENT_AMBULATORY_CARE_PROVIDER_SITE_OTHER): Payer: PRIVATE HEALTH INSURANCE | Admitting: Cardiology

## 2022-10-30 ENCOUNTER — Encounter: Payer: Self-pay | Admitting: Cardiology

## 2022-10-30 VITALS — BP 136/82 | HR 92 | Ht 64.0 in | Wt 164.1 lb

## 2022-10-30 DIAGNOSIS — I2510 Atherosclerotic heart disease of native coronary artery without angina pectoris: Secondary | ICD-10-CM

## 2022-10-30 DIAGNOSIS — E7800 Pure hypercholesterolemia, unspecified: Secondary | ICD-10-CM | POA: Diagnosis not present

## 2022-10-30 DIAGNOSIS — I2583 Coronary atherosclerosis due to lipid rich plaque: Secondary | ICD-10-CM | POA: Diagnosis not present

## 2022-10-30 DIAGNOSIS — I10 Essential (primary) hypertension: Secondary | ICD-10-CM

## 2022-10-30 MED ORDER — AMLODIPINE BESYLATE 10 MG PO TABS: 10.0000 mg | ORAL_TABLET | Freq: Every day | ORAL | 3 refills | Status: AC

## 2022-10-30 NOTE — Patient Instructions (Signed)
Medication Instructions:   STOP HYDRALAZINE  INCREASE AMLODIPINE TO 10 MG ONCE DAILY= 2 OF THE 5 MG TABLETS ONCE DAILY  *If you need a refill on your cardiac medications before your next appointment, please call your pharmacy*   Follow-Up: At [facility], you and your health needs are our priority.  As part of our continuing mission to provide you with exceptional heart care, we have created designated Provider Care Teams.  These Care Teams include your primary Cardiologist (physician) and Advanced Practice Providers (APPs -  Physician Assistants and Nurse Practitioners) who all work together to provide you with the care you need, when you need it.  We recommend signing up for the patient portal called "MyChart".  Sign up information is provided on this After Visit Summary.  MyChart is used to connect with patients for Virtual Visits (Telemedicine).  Patients are able to view lab/test results, encounter notes, upcoming appointments, etc.  Non-urgent messages can be sent to your provider as well.   To learn more about what you can do with MyChart, go to [email or web address].    Your next appointment:   6 month(s)  Provider:   Olga Millers, MD

## 2022-10-31 LAB — COMPREHENSIVE METABOLIC PANEL
ALT: 31 IU/L (ref 0–32)
AST: 31 IU/L (ref 0–40)
Albumin/Globulin Ratio: 2.4 — ABNORMAL HIGH (ref 1.2–2.2)
Albumin: 4.7 g/dL (ref 3.9–4.9)
Alkaline Phosphatase: 76 IU/L (ref 44–121)
BUN/Creatinine Ratio: 14 (ref 12–28)
BUN: 13 mg/dL (ref 8–27)
Bilirubin Total: 0.5 mg/dL (ref 0.0–1.2)
CO2: 18 mmol/L — ABNORMAL LOW (ref 20–29)
Calcium: 9.5 mg/dL (ref 8.7–10.3)
Chloride: 102 mmol/L (ref 96–106)
Creatinine, Ser: 0.92 mg/dL (ref 0.57–1.00)
Globulin, Total: 2 g/dL (ref 1.5–4.5)
Glucose: 142 mg/dL — ABNORMAL HIGH (ref 70–99)
Potassium: 4.1 mmol/L (ref 3.5–5.2)
Sodium: 139 mmol/L (ref 134–144)
Total Protein: 6.7 g/dL (ref 6.0–8.5)
eGFR: 71 mL/min/{1.73_m2} (ref >59)

## 2022-10-31 LAB — LIPID PANEL
Chol/HDL Ratio: 2.8 ratio (ref 0.0–4.4)
Cholesterol, Total: 120 mg/dL (ref 100–199)
HDL: 43 mg/dL (ref >39)
LDL Chol Calc (NIH): 58 mg/dL (ref 0–99)
Triglycerides: 98 mg/dL (ref 0–149)
VLDL Cholesterol Cal: 19 mg/dL (ref 5–40)

## 2022-11-01 ENCOUNTER — Encounter (HOSPITAL_COMMUNITY)
Admission: RE | Admit: 2022-11-01 | Discharge: 2022-11-01 | Disposition: A | Discharge status: Home / Self Care | Payer: PRIVATE HEALTH INSURANCE | Source: Ambulatory Visit | Attending: Cardiology | Admitting: Cardiology

## 2022-11-01 VITALS — BP 112/60 | HR 63 | Ht 63.5 in | Wt 164.5 lb

## 2022-11-01 DIAGNOSIS — Z955 Presence of coronary angioplasty implant and graft: Secondary | ICD-10-CM | POA: Diagnosis present

## 2022-11-01 NOTE — Progress Notes (Signed)
Cardiac Rehab Medication Review   Does the patient  feel that his/her medications are working for him/her?  YES  Has the patient been experiencing any side effects to the medications prescribed?  YES  Does the patient measure his/her own blood pressure or blood glucose at home?  YES    Does the patient have any problems obtaining medications due to transportation or finances?   NO  Understanding of regimen: excellent Understanding of indications: excellent Potential of compliance: excellent    Comments: Kathryn [name] has a great understanding of her medications and what they're for. She checks her BP atleast once a day, sometimes more if she's not feeling well. She c/o some dry mouth at times which she is able to fix with mints and water.     Jonna Coup, MS 11/01/2022 1:23 PM

## 2022-11-01 NOTE — Progress Notes (Signed)
Cardiac Individual Treatment Plan  Patient Details  Name: Kathryn [name] MRN: [id number] Date of Birth: 1960/07/19 Referring Provider:   Flowsheet Row INTENSIVE CARDIAC REHAB ORIENT from 11/01/2022 in [facility]  Referring Provider Olga Millers, MD       Initial Encounter Date:  Flowsheet Row INTENSIVE CARDIAC REHAB ORIENT from 11/01/2022 in [facility]  Date 11/01/22       Visit Diagnosis: S/P drug eluting coronary stent placement  Patient's Home Medications on Admission:  Current Outpatient Medications:    ADDERALL XR 30 MG 24 hr capsule, Take 30 mg by mouth daily., Disp: , Rfl:    albuterol (VENTOLIN HFA) 108 (90 Base) MCG/ACT inhaler, Inhale 1 puff into the lungs every 6 (six) hours as needed for wheezing or shortness of breath., Disp: , Rfl:    ALPRAZolam (XANAX) 0.5 MG tablet, Take 0.5 mg by mouth at bedtime as needed for anxiety., Disp: , Rfl:    amLODipine (NORVASC) 10 MG tablet, Take 1 tablet (10 mg total) by mouth daily., Disp: 90 tablet, Rfl: 3   aspirin 81 MG tablet, Take 81 mg by mouth daily., Disp: , Rfl:    Brimonidine Tartrate (LUMIFY) 0.025 % SOLN, Place 1 drop into both eyes 4 (four) times daily as needed (dry eyes)., Disp: , Rfl:    buPROPion (WELLBUTRIN XL) 300 MG 24 hr tablet, Take 300 mg by mouth every morning., Disp: , Rfl:    cholecalciferol (VITAMIN D3) 25 MCG (1000 UNIT) tablet, Take 1,000 Units by mouth daily., Disp: , Rfl:    clobetasol (TEMOVATE) 0.05 % external solution, Apply 1 Application topically 2 (two) times daily as needed (irritation)., Disp: , Rfl:    clopidogrel (PLAVIX) 75 MG tablet, Take 1 tablet (75 mg total) by mouth daily., Disp: 90 tablet, Rfl: 3   cyanocobalamin (VITAMIN B12) 1000 MCG tablet, Take 3,000 mcg by mouth daily., Disp: , Rfl:    desvenlafaxine (PRISTIQ) 100 MG 24 hr tablet, Take 100 mg by mouth every morning.,  Disp: , Rfl:    levothyroxine (SYNTHROID) 112 MCG tablet, Take 112 mcg by mouth daily before breakfast., Disp: , Rfl:    losartan (COZAAR) 100 MG tablet, Take 100 mg by mouth daily., Disp: , Rfl:    MAGNESIUM GLYCINATE PO, Take 420 mg by mouth daily., Disp: , Rfl:    Melatonin 10 MG CHEW, Chew 10 mg by mouth at bedtime., Disp: , Rfl:    nitroGLYCERIN (NITROSTAT) 0.4 MG SL tablet, Place 1 tablet (0.4 mg total) under the tongue every 5 (five) minutes as needed., Disp: 25 tablet, Rfl: 2   Potassium Chloride ER 20 MEQ TBCR, TAKE 1 TABLET BY MOUTH TWO  TIMES DAILY, Disp: 30 tablet, Rfl: 0   rosuvastatin (CRESTOR) 40 MG tablet, TAKE 1 TABLET DAILY (SCHEDULE AN APPOINTMENT FOR FURTHER REFILLS), Disp: 90 tablet, Rfl: 3   triamterene-hydrochlorothiazide (MAXZIDE) 75-50 MG tablet, Take 1 tablet by mouth daily. Restart this after FU with PCP, Disp: , Rfl:    valACYclovir (VALTREX) 500 MG tablet, Take 500 mg by mouth daily as needed (for outbreaks). , Disp: , Rfl:   Past Medical History: Past Medical History:  Diagnosis Date   AKI (acute kidney injury) 01/21/2019   Anxiety    GERD (gastroesophageal reflux disease)    Graves disease    with hyperthyroidism diagnosed at age 38 (treated with propylthiouracil for 2 years)  Hyperlipidemia    Hypertension    Hypothyroidism    Sleep disturbance     Tobacco Use: Social History   Tobacco Use  Smoking Status Never  Smokeless Tobacco Never    Labs: Review Flowsheet  More data may exist      Latest Ref Rng & Units 12/10/2013 01/21/2019 10/05/2020 06/30/2021 10/30/2022  Labs for ITP Cardiac and Pulmonary Rehab  Cholestrol 100 - 199 mg/dL - - 952  841  324   LDL (calc) 0 - 99 mg/dL - - 53  50  58   HDL-C >39 mg/dL - - 51  52  43   Trlycerides 0 - 149 mg/dL - - 59  82  98   Hemoglobin A1c 4.8 - 5.6 % 5.9  5.8  - - -    Capillary Blood Glucose: Lab Results  Component Value Date   GLUCAP 102 (H) 01/23/2019   GLUCAP 119 (H) 01/22/2019   GLUCAP 123  (H) 01/21/2019   GLUCAP 113 (H) 01/21/2019   GLUCAP 102 (H) 01/21/2019     Exercise Target Goals: Exercise Program Goal: Individual exercise prescription set using results from initial 6 min walk test and THRR while considering  patient's activity barriers and safety.   Exercise Prescription Goal: Initial exercise prescription builds to 30-45 minutes a day of aerobic activity, 2-3 days per week.  Home exercise guidelines will be given to patient during program as part of exercise prescription that the participant will acknowledge.  Activity Barriers & Risk Stratification:  Activity Barriers & Cardiac Risk Stratification - 11/01/22 1313       Activity Barriers & Cardiac Risk Stratification   Activity Barriers Neck/Spine Problems    Cardiac Risk Stratification High   <5 METs on            6 Minute Walk:  6 Minute Walk     Row Name 11/01/22 1312         6 Minute Walk   Phase Initial     Distance 1800 feet     Walk Time 6 minutes     # of Rest Breaks 0     MPH 3.41     METS 4.06     RPE 12     Perceived Dyspnea  0     VO2 Peak 14.23     Symptoms No     Resting HR 64 bpm     Resting BP 112/60     Resting Oxygen Saturation  97 %     Exercise Oxygen Saturation  during 6 min walk 98 %     Max Ex. HR 92 bpm     Max Ex. BP 132/68     2 Minute Post BP 120/66              Oxygen Initial Assessment:   Oxygen Re-Evaluation:   Oxygen Discharge (Final Oxygen Re-Evaluation):   Initial Exercise Prescription:  Initial Exercise Prescription - 11/01/22 1300       Date of Initial Exercise RX and Referring Provider   Date 11/01/22    Referring Provider Olga Millers, MD    Expected Discharge Date 12/15/22      Treadmill   MPH 3    Grade 0    Minutes 15    METs 3.5      Arm Ergometer   Level 1    Watts 20    RPM 60    Minutes 15    METs 2.5  Prescription Details   Frequency (times per week) 3    Duration Progress to 30 minutes of  continuous aerobic without signs/symptoms of physical distress      Intensity   THRR 40-80% of Max Heartrate 63-126    Ratings of Perceived Exertion 11-13    Perceived Dyspnea 0-4      Progression   Progression Continue progressive overload as per policy without signs/symptoms or physical distress.      Resistance Training   Training Prescription Yes    Weight 3    Reps 10-15             Perform Capillary Blood Glucose checks as needed.  Exercise Prescription Changes:   Exercise Comments:   Exercise Goals and Review:   Exercise Goals     Row Name 11/01/22 1028             Exercise Goals   Increase Physical Activity Yes       Intervention Provide advice, education, support and counseling about physical activity/exercise needs.;Develop an individualized exercise prescription for aerobic and resistive training based on initial evaluation findings, risk stratification, comorbidities and participant's personal goals.       Expected Outcomes Short Term: Attend rehab on a regular basis to increase amount of physical activity.;Long Term: Exercising regularly at least 3-5 days a week.;Long Term: Add in home exercise to make exercise part of routine and to increase amount of physical activity.       Increase Strength and Stamina Yes       Intervention Develop an individualized exercise prescription for aerobic and resistive training based on initial evaluation findings, risk stratification, comorbidities and participant's personal goals.;Provide advice, education, support and counseling about physical activity/exercise needs.       Expected Outcomes Short Term: Increase workloads from initial exercise prescription for resistance, speed, and METs.;Short Term: Perform resistance training exercises routinely during rehab and add in resistance training at home;Long Term: Improve cardiorespiratory fitness, muscular endurance and strength as measured by increased METs and functional  capacity ( )       Able to understand and use rate of perceived exertion (RPE) scale Yes       Intervention Provide education and explanation on how to use RPE scale       Expected Outcomes Short Term: Able to use RPE daily in rehab to express subjective intensity level;Long Term:  Able to use RPE to guide intensity level when exercising independently       Knowledge and understanding of Target Heart Rate Range (THRR) Yes       Intervention Provide education and explanation of THRR including how the numbers were predicted and where they are located for reference       Expected Outcomes Short Term: Able to state/look up THRR;Short Term: Able to use daily as guideline for intensity in rehab;Long Term: Able to use THRR to govern intensity when exercising independently       Understanding of Exercise Prescription Yes       Intervention Provide education, explanation, and written materials on patient's individual exercise prescription       Expected Outcomes Short Term: Able to explain program exercise prescription;Long Term: Able to explain home exercise prescription to exercise independently                Exercise Goals Re-Evaluation :   Discharge Exercise Prescription (Final Exercise Prescription Changes):   Nutrition:  Target Goals: Understanding of nutrition guidelines, daily intake of sodium 1500mg , cholesterol 200mg ,  calories 30% from fat and 7% or less from saturated fats, daily to have 5 or more servings of fruits and vegetables.  Biometrics:  Pre Biometrics - 11/01/22 1026       Pre Biometrics   Waist Circumference 37.5 inches    Hip Circumference 41.5 inches    Waist to Hip Ratio 0.9 %    Triceps Skinfold 27 mm    % Body Fat 39.8 %    Grip Strength 28 kg    Flexibility 17.5 in    Single Leg Stand 22.56 seconds              Nutrition Therapy Plan and Nutrition Goals:   Nutrition Assessments:  MEDIFICTS Score Key: ?70 Need to make dietary changes  40-70  Heart Healthy Diet ? 40 Therapeutic Level Cholesterol Diet    Picture Your Plate Scores: <69 Unhealthy dietary pattern with much room for improvement. 41-50 Dietary pattern unlikely to meet recommendations for good health and room for improvement. 51-60 More healthful dietary pattern, with some room for improvement.  >60 Healthy dietary pattern, although there may be some specific behaviors that could be improved.    Nutrition Goals Re-Evaluation:   Nutrition Goals Re-Evaluation:   Nutrition Goals Discharge (Final Nutrition Goals Re-Evaluation):   Psychosocial: Target Goals: Acknowledge presence or absence of significant depression and/or stress, maximize coping skills, provide positive support system. Participant is able to verbalize types and ability to use techniques and skills needed for reducing stress and depression.  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 She says that she has a hard time allowing others to do things for her and tends to take on more than she is capable of. This causes her to feel anxious at times and keeps her awake at night because she cannot turn off her thoughts to relax. She shared that at times she feels her heart is racing and has shared that with her cardiologist.      Family Dynamics   Good Support System? Yes   Yael has her sisters and friends   Comments Coralyn shared that she has feelings of depression related to some recent life changes. She is actively going through a divorce, and her son and sister just moved out of her home. She said that while it is nice to not support  them any longer, it is also a big change that she has struggled with. 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 She stated that she feels like her medications are no longer working for her as well as they used to and she is interested in going back to a Veterinary surgeon. She has tried asking her friends for places to go, but they were no longer taking patients. Addalee was visibly emotional, support and reassurance was offered. She was encouraged to speak with her PCP about options for counseling as well as adjustments to her medications. Koa stated she would speak to her PCP.  Barriers   Psychosocial barriers to participate in program The patient should benefit from training in stress management and relaxation.      Screening Interventions   Interventions Encouraged to exercise;To provide support and resources with identified psychosocial needs;Provide feedback about the scores to participant    Expected Outcomes Short Term goal: Utilizing psychosocial counselor, staff and physician to assist with identification of specific Stressors or current issues interfering with healing process. Setting desired goal for each stressor or current issue identified.;Long Term Goal: Stressors or current issues are controlled or eliminated.;Short Term goal: Identification and review with participant of any Quality of Life or Depression concerns found by scoring the questionnaire.;Long Term goal: The participant improves quality of Life and PHQ9 Scores as seen by post scores and/or verbalization of changes             Quality of Life Scores:  Quality of Life - 11/01/22 1321       Quality of Life   Select Quality of Life      Quality of Life Scores   Health/Function Pre 18.4 %    Socioeconomic Pre 22.86 %     Psych/Spiritual Pre 21.79 %    Family Pre 23.63 %    GLOBAL Pre 20.7 %            Scores of 19 and below usually indicate a poorer quality of life in these areas.  A difference of  2-3 points is a clinically meaningful difference.  A difference of 2-3 points in the total score of the Quality of Life Index has been associated with significant improvement in overall quality of life, self-image, physical symptoms, and general health in studies assessing change in quality of life.  PHQ-9: Review Flowsheet       11/01/2022  Depression screen PHQ 2/9  Decreased Interest 1  Down, Depressed, Hopeless 1  PHQ - 2 Score 2  Altered sleeping 3  Tired, decreased energy 2  Change in appetite 1  Feeling bad or failure about yourself  1  Trouble concentrating 3  Moving slowly or fidgety/restless 1  Suicidal thoughts 0  PHQ-9 Score 13  Difficult doing work/chores Somewhat difficult   Interpretation of Total Score  Total Score Depression Severity:  1-4 = Minimal depression, 5-9 = Mild depression, 10-14 = Moderate depression, 15-19 = Moderately severe depression, 20-27 = Severe depression   Psychosocial Evaluation and Intervention:   Psychosocial Re-Evaluation:   Psychosocial Discharge (Final Psychosocial Re-Evaluation):   Vocational Rehabilitation: Provide vocational rehab assistance to qualifying candidates.   Vocational Rehab Evaluation & Intervention:  Vocational Rehab - 11/01/22 1322       Initial Vocational Rehab Evaluation & Intervention   Assessment shows need for Vocational Rehabilitation No   Tonda is currently working            Education: Education Goals: Education classes will be provided on a weekly basis, covering required topics. Participant will state understanding/return demonstration of topics presented.     Core Videos: Exercise    Move It!  Clinical staff conducted group or individual video education with verbal and written material and guidebook.   Patient learns the recommended Pritikin exercise program. Exercise with the goal of living a long, healthy life. Some of the health benefits of exercise include controlled diabetes, healthier blood pressure levels, improved cholesterol levels, improved heart and lung capacity, improved sleep, and better body composition. Everyone should speak with their doctor before starting or changing an exercise routine.  Biomechanical Limitations  Clinical staff conducted group or individual video education with verbal and written material and guidebook.  Patient learns how biomechanical limitations can impact exercise and how we can mitigate and possibly overcome limitations to have an impactful and balanced exercise routine.  Body Composition Clinical staff conducted group or individual video education with verbal and written material and guidebook.  Patient learns that body composition (ratio of muscle mass to fat mass) is a key component to assessing overall fitness, rather than body weight alone. Increased fat mass, especially visceral belly fat, can put [place] at increased risk for metabolic syndrome, type 2 diabetes, heart disease, and even death. It is recommended to combine diet and exercise (cardiovascular and resistance training) to improve your body composition. Seek guidance from your physician and exercise physiologist before implementing an exercise routine.  Exercise Action Plan Clinical staff conducted group or individual video education with verbal and written material and guidebook.  Patient learns the recommended strategies to achieve and enjoy long-term exercise adherence, including variety, self-motivation, self-efficacy, and positive decision making. Benefits of exercise include fitness, good health, weight management, more energy, better sleep, less stress, and overall well-being.  Medical   Heart Disease Risk Reduction Clinical staff conducted group or individual video education with verbal  and written material and guidebook.  Patient learns our heart is our most vital organ as it circulates oxygen, nutrients, white blood cells, and hormones throughout the entire body, and carries waste away. Data supports a plant-based eating plan like the Pritikin Program for its effectiveness in slowing progression of and reversing heart disease. The video provides a number of recommendations to address heart disease.   Metabolic Syndrome and Belly Fat  Clinical staff conducted group or individual video education with verbal and written material and guidebook.  Patient learns what metabolic syndrome is, how it leads to heart disease, and how one can reverse it and keep it from coming back. You have metabolic syndrome if you have 3 of the following 5 criteria: abdominal obesity, high blood pressure, high triglycerides, low HDL cholesterol, and high blood sugar.  Hypertension and Heart Disease Clinical staff conducted group or individual video education with verbal and written material and guidebook.  Patient learns that high blood pressure, or hypertension, is very common in the [place]. Hypertension is largely due to excessive salt intake, but other important risk factors include being overweight, physical inactivity, drinking too much alcohol, smoking, and not eating enough potassium from fruits and vegetables. High blood pressure is a leading risk factor for heart attack, stroke, congestive heart failure, dementia, kidney failure, and premature death. Long-term effects of excessive salt intake include stiffening of the arteries and thickening of heart muscle and organ damage. Recommendations include ways to reduce hypertension and the risk of heart disease.  Diseases of Our Time - Focusing on Diabetes Clinical staff conducted group or individual video education with verbal and written material and guidebook.  Patient learns why the best way to stop diseases of our time is prevention, through  food and other lifestyle changes. Medicine (such as prescription pills and surgeries) is often only a Band-Aid on the problem, not a long-term solution. Most common diseases of our time include obesity, type 2 diabetes, hypertension, heart disease, and cancer. The Pritikin Program is recommended and has been proven to help reduce, reverse, and/or prevent the damaging effects of metabolic syndrome.  Nutrition   Overview of the Pritikin Eating Plan  Clinical staff conducted group or individual video education with verbal and  written material and guidebook.  Patient learns about the Pritikin Eating Plan for disease risk reduction. The Pritikin Eating Plan emphasizes a wide variety of unrefined, minimally-processed carbohydrates, like fruits, vegetables, whole grains, and legumes. Go, Caution, and Stop food choices are explained. Plant-based and lean animal proteins are emphasized. Rationale provided for low sodium intake for blood pressure control, low added sugars for blood sugar stabilization, and low added fats and oils for coronary artery disease risk reduction and weight management.  Calorie Density  Clinical staff conducted group or individual video education with verbal and written material and guidebook.  Patient learns about calorie density and how it impacts the Pritikin Eating Plan. Knowing the characteristics of the food you choose will help you decide whether those foods will lead to weight gain or weight loss, and whether you want to consume more or less of them. Weight loss is usually a side effect of the Pritikin Eating Plan because of its focus on low calorie-dense foods.  Label Reading  Clinical staff conducted group or individual video education with verbal and written material and guidebook.  Patient learns about the Pritikin recommended label reading guidelines and corresponding recommendations regarding calorie density, added sugars, sodium content, and whole grains.  Dining Out -  Part 1  Clinical staff conducted group or individual video education with verbal and written material and guidebook.  Patient learns that restaurant meals can be sabotaging because they can be so high in calories, fat, sodium, and/or sugar. Patient learns recommended strategies on how to positively address this and avoid unhealthy pitfalls.  Facts on Fats  Clinical staff conducted group or individual video education with verbal and written material and guidebook.  Patient learns that lifestyle modifications can be just as effective, if not more so, as many medications for lowering your risk of heart disease. A Pritikin lifestyle can help to reduce your risk of inflammation and atherosclerosis (cholesterol build-up, or plaque, in the artery walls). Lifestyle interventions such as dietary choices and physical activity address the cause of atherosclerosis. A review of the types of fats and their impact on blood cholesterol levels, along with dietary recommendations to reduce fat intake is also included.  Nutrition Action Plan  Clinical staff conducted group or individual video education with verbal and written material and guidebook.  Patient learns how to incorporate Pritikin recommendations into their lifestyle. Recommendations include planning and keeping personal health goals in mind as an important part of their success.  Healthy Mind-Set    Healthy Minds, Bodies, Hearts  Clinical staff conducted group or individual video education with verbal and written material and guidebook.  Patient learns how to identify when they are stressed. Video will discuss the impact of that stress, as well as the many benefits of stress management. Patient will also be introduced to stress management techniques. The way we think, act, and feel has an impact on our hearts.  How Our Thoughts Can Heal Our Hearts  Clinical staff conducted group or individual video education with verbal and written material and  guidebook.  Patient learns that negative thoughts can cause depression and anxiety. This can result in negative lifestyle behavior and serious health problems. Cognitive behavioral therapy is an effective method to help control our thoughts in order to change and improve our emotional outlook.  Additional Videos:  Exercise    Improving Performance  Clinical staff conducted group or individual video education with verbal and written material and guidebook.  Patient learns to use a non-linear approach by  alternating intensity levels and lengths of time spent exercising to help burn more calories and lose more body fat. Cardiovascular exercise helps improve heart health, metabolism, hormonal balance, blood sugar control, and recovery from fatigue. Resistance training improves strength, endurance, balance, coordination, reaction time, metabolism, and muscle mass. Flexibility exercise improves circulation, posture, and balance. Seek guidance from your physician and exercise physiologist before implementing an exercise routine and learn your capabilities and proper form for all exercise.  Introduction to Yoga  Clinical staff conducted group or individual video education with verbal and written material and guidebook.  Patient learns about yoga, a discipline of the coming together of mind, breath, and body. The benefits of yoga include improved flexibility, improved range of motion, better posture and core strength, increased lung function, weight loss, and positive self-image. Yoga's heart health benefits include lowered blood pressure, healthier heart rate, decreased cholesterol and triglyceride levels, improved immune function, and reduced stress. Seek guidance from your physician and exercise physiologist before implementing an exercise routine and learn your capabilities and proper form for all exercise.  Medical   Aging: Enhancing Your Quality of Life  Clinical staff conducted group or individual  video education with verbal and written material and guidebook.  Patient learns key strategies and recommendations to stay in good physical health and enhance quality of life, such as prevention strategies, having an advocate, securing a Health Care Proxy and Power of Attorney, and keeping a list of medications and system for tracking them. It also discusses how to avoid risk for bone loss.  Biology of Weight Control  Clinical staff conducted group or individual video education with verbal and written material and guidebook.  Patient learns that weight gain occurs because we consume more calories than we burn (eating more, moving less). Even if your body weight is normal, you may have higher ratios of fat compared to muscle mass. Too much body fat puts you at increased risk for cardiovascular disease, heart attack, stroke, type 2 diabetes, and obesity-related cancers. In addition to exercise, following the Pritikin Eating Plan can help reduce your risk.  Decoding Lab Results  Clinical staff conducted group or individual video education with verbal and written material and guidebook.  Patient learns that lab test reflects one measurement whose values change over time and are influenced by many factors, including medication, stress, sleep, exercise, food, hydration, pre-existing medical conditions, and more. It is recommended to use the knowledge from this video to become more involved with your lab results and evaluate your numbers to speak with your doctor.   Diseases of Our Time - Overview  Clinical staff conducted group or individual video education with verbal and written material and guidebook.  Patient learns that according to the CDC, 50% to 70% of chronic diseases (such as obesity, type 2 diabetes, elevated lipids, hypertension, and heart disease) are avoidable through lifestyle improvements including healthier food choices, listening to satiety cues, and increased physical activity.  Sleep  Disorders Clinical staff conducted group or individual video education with verbal and written material and guidebook.  Patient learns how good quality and duration of sleep are important to overall health and well-being. Patient also learns about sleep disorders and how they impact health along with recommendations to address them, including discussing with a physician.  Nutrition  Dining Out - Part 2 Clinical staff conducted group or individual video education with verbal and written material and guidebook.  Patient learns how to plan ahead and communicate in order to maximize their dining  experience in a healthy and nutritious manner. Included are recommended food choices based on the type of restaurant the patient is visiting.   Fueling a Banker conducted group or individual video education with verbal and written material and guidebook.  There is a strong connection between our food choices and our health. Diseases like obesity and type 2 diabetes are very prevalent and are in large-part due to lifestyle choices. The Pritikin Eating Plan provides plenty of food and hunger-curbing satisfaction. It is easy to follow, affordable, and helps reduce health risks.  Menu Workshop  Clinical staff conducted group or individual video education with verbal and written material and guidebook.  Patient learns that restaurant meals can sabotage health goals because they are often packed with calories, fat, sodium, and sugar. Recommendations include strategies to plan ahead and to communicate with the manager, chef, or server to help order a healthier meal.  Planning Your Eating Strategy  Clinical staff conducted group or individual video education with verbal and written material and guidebook.  Patient learns about the Pritikin Eating Plan and its benefit of reducing the risk of disease. The Pritikin Eating Plan does not focus on calories. Instead, it emphasizes high-quality,  nutrient-rich foods. By knowing the characteristics of the foods, we choose, we can determine their calorie density and make informed decisions.  Targeting Your Nutrition Priorities  Clinical staff conducted group or individual video education with verbal and written material and guidebook.  Patient learns that lifestyle habits have a tremendous impact on disease risk and progression. This video provides eating and physical activity recommendations based on your personal health goals, such as reducing LDL cholesterol, losing weight, preventing or controlling type 2 diabetes, and reducing high blood pressure.  Vitamins and Minerals  Clinical staff conducted group or individual video education with verbal and written material and guidebook.  Patient learns different ways to obtain key vitamins and minerals, including through a recommended healthy diet. It is important to discuss all supplements you take with your doctor.   Healthy Mind-Set    Smoking Cessation  Clinical staff conducted group or individual video education with verbal and written material and guidebook.  Patient learns that cigarette smoking and tobacco addiction pose a serious health risk which affects millions of people. Stopping smoking will significantly reduce the risk of heart disease, lung disease, and many forms of cancer. Recommended strategies for quitting are covered, including working with your doctor to develop a successful plan.  Culinary   Becoming a Set designer conducted group or individual video education with verbal and written material and guidebook.  Patient learns that cooking at home can be healthy, cost-effective, quick, and puts them in control. Keys to cooking healthy recipes will include looking at your recipe, assessing your equipment needs, planning ahead, making it simple, choosing cost-effective seasonal ingredients, and limiting the use of added fats, salts, and sugars.  Cooking -  Breakfast and Snacks  Clinical staff conducted group or individual video education with verbal and written material and guidebook.  Patient learns how important breakfast is to satiety and nutrition through the entire day. Recommendations include key foods to eat during breakfast to help stabilize blood sugar levels and to prevent overeating at meals later in the day. Planning ahead is also a key component.  Cooking - Educational psychologist conducted group or individual video education with verbal and written material and guidebook.  Patient learns eating strategies to improve overall health,  including an approach to cook more at home. Recommendations include thinking of animal protein as a side on your plate rather than center stage and focusing instead on lower calorie dense options like vegetables, fruits, whole grains, and plant-based proteins, such as beans. Making sauces in large quantities to freeze for later and leaving the skin on your vegetables are also recommended to maximize your experience.  Cooking - Healthy Salads and Dressing Clinical staff conducted group or individual video education with verbal and written material and guidebook.  Patient learns that vegetables, fruits, whole grains, and legumes are the foundations of the Pritikin Eating Plan. Recommendations include how to incorporate each of these in flavorful and healthy salads, and how to create homemade salad dressings. Proper handling of ingredients is also covered. Cooking - Soups and [employer] - Soups and Desserts Clinical staff conducted group or individual video education with verbal and written material and guidebook.  Patient learns that Pritikin soups and desserts make for easy, nutritious, and delicious snacks and meal components that are low in sodium, fat, sugar, and calorie density, while high in vitamins, minerals, and filling fiber. Recommendations include simple and healthy ideas for soups and  desserts.   Overview     The Pritikin Solution Program Overview Clinical staff conducted group or individual video education with verbal and written material and guidebook.  Patient learns that the results of the Pritikin Program have been documented in more than 100 articles published in peer-reviewed journals, and the benefits include reducing risk factors for (and, in some cases, even reversing) high cholesterol, high blood pressure, type 2 diabetes, obesity, and more! An overview of the three key pillars of the Pritikin Program will be covered: eating well, doing regular exercise, and having a healthy mind-set.  WORKSHOPS  Exercise: Exercise Basics: Building Your Action Plan Clinical staff led group instruction and group discussion with PowerPoint presentation and patient guidebook. To enhance the learning environment the use of posters, models and videos may be added. At the conclusion of this workshop, patients will comprehend the difference between physical activity and exercise, as well as the benefits of incorporating both, into their routine. Patients will understand the FITT (Frequency, Intensity, Time, and Type) principle and how to use it to build an exercise action plan. In addition, safety concerns and other considerations for exercise and cardiac rehab will be addressed by the presenter. The purpose of this lesson is to promote a comprehensive and effective weekly exercise routine in order to improve patients' overall level of fitness.   Managing Heart Disease: Your Path to a Healthier Heart Clinical staff led group instruction and group discussion with PowerPoint presentation and patient guidebook. To enhance the learning environment the use of posters, models and videos may be added.At the conclusion of this workshop, patients will understand the anatomy and physiology of the heart. Additionally, they will understand how Pritikin's three pillars impact the risk factors, the  progression, and the management of heart disease.  The purpose of this lesson is to provide a high-level overview of the heart, heart disease, and how the Pritikin lifestyle positively impacts risk factors.  Exercise Biomechanics Clinical staff led group instruction and group discussion with PowerPoint presentation and patient guidebook. To enhance the learning environment the use of posters, models and videos may be added. Patients will learn how the structural parts of their bodies function and how these functions impact their daily activities, movement, and exercise. Patients will learn how to promote a neutral spine, learn  how to manage pain, and identify ways to improve their physical movement in order to promote healthy living. The purpose of this lesson is to expose patients to common physical limitations that impact physical activity. Participants will learn practical ways to adapt and manage aches and pains, and to minimize their effect on regular exercise. Patients will learn how to maintain good posture while sitting, walking, and lifting.  Balance Training and Fall Prevention  Clinical staff led group instruction and group discussion with PowerPoint presentation and patient guidebook. To enhance the learning environment the use of posters, models and videos may be added. At the conclusion of this workshop, patients will understand the importance of their sensorimotor skills (vision, proprioception, and the vestibular system) in maintaining their ability to balance as they age. Patients will apply a variety of balancing exercises that are appropriate for their current level of function. Patients will understand the common causes for poor balance, possible solutions to these problems, and ways to modify their physical environment in order to minimize their fall risk. The purpose of this lesson is to teach patients about the importance of maintaining balance as they age and ways to  minimize their risk of falling.  WORKSHOPS   Nutrition:  Fueling a Ship broker led group instruction and group discussion with PowerPoint presentation and patient guidebook. To enhance the learning environment the use of posters, models and videos may be added. Patients will review the foundational principles of the Pritikin Eating Plan and understand what constitutes a serving size in each of the food groups. Patients will also learn Pritikin-friendly foods that are better choices when away from home and review make-ahead meal and snack options. Calorie density will be reviewed and applied to three nutrition priorities: weight maintenance, weight loss, and weight gain. The purpose of this lesson is to reinforce (in a group setting) the key concepts around what patients are recommended to eat and how to apply these guidelines when away from home by planning and selecting Pritikin-friendly options. Patients will understand how calorie density may be adjusted for different weight management goals.  Mindful Eating  Clinical staff led group instruction and group discussion with PowerPoint presentation and patient guidebook. To enhance the learning environment the use of posters, models and videos may be added. Patients will briefly review the concepts of the Pritikin Eating Plan and the importance of low-calorie dense foods. The concept of mindful eating will be introduced as well as the importance of paying attention to internal hunger signals. Triggers for non-hunger eating and techniques for dealing with triggers will be explored. The purpose of this lesson is to provide patients with the opportunity to review the basic principles of the Pritikin Eating Plan, discuss the value of eating mindfully and how to measure internal cues of hunger and fullness using the Hunger Scale. Patients will also discuss reasons for non-hunger eating and learn strategies to use for controlling emotional  eating.  Targeting Your Nutrition Priorities Clinical staff led group instruction and group discussion with PowerPoint presentation and patient guidebook. To enhance the learning environment the use of posters, models and videos may be added. Patients will learn how to determine their genetic susceptibility to disease by reviewing their family history. Patients will gain insight into the importance of diet as part of an overall healthy lifestyle in mitigating the impact of genetics and other environmental insults. The purpose of this lesson is to provide patients with the opportunity to assess their personal nutrition priorities by looking  at their family history, their own health history and current risk factors. Patients will also be able to discuss ways of prioritizing and modifying the Pritikin Eating Plan for their highest risk areas  Menu  Clinical staff led group instruction and group discussion with PowerPoint presentation and patient guidebook. To enhance the learning environment the use of posters, models and videos may be added. Using menus brought in from [employer], or printed from [employer], patients will apply the Pritikin dining out guidelines that were presented in the [employer] video. Patients will also be able to practice these guidelines in a variety of provided scenarios. The purpose of this lesson is to provide patients with the opportunity to practice hands-on learning of the Pritikin Dining Out guidelines with actual menus and practice scenarios.  Label Reading Clinical staff led group instruction and group discussion with PowerPoint presentation and patient guidebook. To enhance the learning environment the use of posters, models and videos may be added. Patients will review and discuss the Pritikin label reading guidelines presented in Pritikin's Label Reading Educational series video. Using fool labels brought in from local grocery stores and  markets, patients will apply the label reading guidelines and determine if the packaged food meet the Pritikin guidelines. The purpose of this lesson is to provide patients with the opportunity to review, discuss, and practice hands-on learning of the Pritikin Label Reading guidelines with actual packaged food labels. Cooking School  Pritikin's [employer] are designed to teach patients ways to prepare quick, simple, and affordable recipes at home. The importance of nutrition's role in chronic disease risk reduction is reflected in its emphasis in the overall Pritikin program. By learning how to prepare essential core Pritikin Eating Plan recipes, patients will increase control over what they eat; be able to customize the flavor of foods without the use of added salt, sugar, or fat; and improve the quality of the food they consume. By learning a set of core recipes which are easily assembled, quickly prepared, and affordable, patients are more likely to prepare more healthy foods at home. These workshops focus on convenient breakfasts, simple entres, side dishes, and desserts which can be prepared with minimal effort and are consistent with nutrition recommendations for cardiovascular risk reduction. Cooking [employer] are taught by a Armed forces logistics/support/administrative officer (RD) who has been trained by the [employer]. The chef or RD has a clear understanding of the importance of minimizing - if not completely eliminating - added fat, sugar, and sodium in recipes. Throughout the series of Cooking School Workshop sessions, patients will learn about healthy ingredients and efficient methods of cooking to build confidence in their capability to prepare    Cooking School weekly topics:  Adding Flavor- Sodium-Free  Fast and Healthy Breakfasts  Powerhouse Plant-Based Proteins  Satisfying Salads and Dressings  Simple Sides and Sauces  International Cuisine-Spotlight on the [employer]  Zones  Delicious Desserts  Savory Soups  [employer] - Meals in a Astronomer Appetizers and Snacks  Comforting Weekend Breakfasts  One-Pot Wonders   Fast Evening Meals  Landscape architect Your Pritikin Plate  WORKSHOPS   Healthy Mindset (Psychosocial):  Focused Goals, Sustainable Changes Clinical staff led group instruction and group discussion with PowerPoint presentation and patient guidebook. To enhance the learning environment the use of posters, models and videos may be added. Patients will be able to apply effective goal setting strategies to establish at least one personal goal, and  then take consistent, meaningful action toward that goal. They will learn to identify common barriers to achieving personal goals and develop strategies to overcome them. Patients will also gain an understanding of how our mind-set can impact our ability to achieve goals and the importance of cultivating a positive and growth-oriented mind-set. The purpose of this lesson is to provide patients with a deeper understanding of how to set and achieve personal goals, as well as the tools and strategies needed to overcome common obstacles which may arise along the way.  From Head to Heart: The Power of a Healthy Outlook  Clinical staff led group instruction and group discussion with PowerPoint presentation and patient guidebook. To enhance the learning environment the use of posters, models and videos may be added. Patients will be able to recognize and describe the impact of emotions and mood on physical health. They will discover the importance of self-care and explore self-care practices which may work for them. Patients will also learn how to utilize the 4 C's to cultivate a healthier outlook and better manage stress and challenges. The purpose of this lesson is to demonstrate to patients how a healthy outlook is an essential part of maintaining good health, especially as they continue their  cardiac rehab journey.  Healthy Sleep for a Healthy Heart Clinical staff led group instruction and group discussion with PowerPoint presentation and patient guidebook. To enhance the learning environment the use of posters, models and videos may be added. At the conclusion of this workshop, patients will be able to demonstrate knowledge of the importance of sleep to overall health, well-being, and quality of life. They will understand the symptoms of, and treatments for, common sleep disorders. Patients will also be able to identify daytime and nighttime behaviors which impact sleep, and they will be able to apply these tools to help manage sleep-related challenges. The purpose of this lesson is to provide patients with a general overview of sleep and outline the importance of quality sleep. Patients will learn about a few of the most common sleep disorders. Patients will also be introduced to the concept of "sleep hygiene," and discover ways to self-manage certain sleeping problems through simple daily behavior changes. Finally, the workshop will motivate patients by clarifying the links between quality sleep and their goals of heart-healthy living.   Recognizing and Reducing Stress Clinical staff led group instruction and group discussion with PowerPoint presentation and patient guidebook. To enhance the learning environment the use of posters, models and videos may be added. At the conclusion of this workshop, patients will be able to understand the types of stress reactions, differentiate between acute and chronic stress, and recognize the impact that chronic stress has on their health. They will also be able to apply different coping mechanisms, such as reframing negative self-talk. Patients will have the opportunity to practice a variety of stress management techniques, such as deep abdominal breathing, progressive muscle relaxation, and/or guided imagery.  The purpose of this lesson is to educate  patients on the role of stress in their lives and to provide healthy techniques for coping with it.  Learning Barriers/Preferences:  Learning Barriers/Preferences - 11/01/22 1322       Learning Barriers/Preferences   Learning Barriers Sight   glasses   Learning Preferences Computer/Internet;Group Instruction;Individual Instruction;Pictoral;Skilled Demonstration;Verbal Instruction;Video;Written Material             Education Topics:  Knowledge Questionnaire Score:  Knowledge Questionnaire Score - 11/01/22 1028       Knowledge Questionnaire  Score   Pre Score 21/24             Core Components/Risk Factors/Patient Goals at Admission:  Personal Goals and Risk Factors at Admission - 11/01/22 1322       Core Components/Risk Factors/Patient Goals on Admission   Hypertension Yes    Intervention Provide education on lifestyle modifcations including regular physical activity/exercise, weight management, moderate sodium restriction and increased consumption of fresh fruit, vegetables, and low fat dairy, alcohol moderation, and smoking cessation.;Monitor prescription use compliance.    Expected Outcomes Short Term: Continued assessment and intervention until BP is < 140/17mm HG in hypertensive participants. < 130/14mm HG in hypertensive participants with diabetes, heart failure or chronic kidney disease.;Long Term: Maintenance of blood pressure at goal levels.    Lipids Yes    Intervention Provide education and support for participant on nutrition & aerobic/resistive exercise along with prescribed medications to achieve LDL 70mg , HDL >40mg .    Expected Outcomes Short Term: Participant states understanding of desired cholesterol values and is compliant with medications prescribed. Participant is following exercise prescription and nutrition guidelines.;Long Term: Cholesterol controlled with medications as prescribed, with individualized exercise RX and with personalized nutrition plan.  Value goals: LDL < 70mg , HDL > 40 mg.    Stress Yes    Intervention Offer individual and/or small group education and counseling on adjustment to heart disease, stress management and health-related lifestyle change. Teach and support self-help strategies.;Refer participants experiencing significant psychosocial distress to appropriate mental health specialists for further evaluation and treatment. When possible, include family members and significant others in education/counseling sessions.    Expected Outcomes Short Term: Participant demonstrates changes in health-related behavior, relaxation and other stress management skills, ability to obtain effective social support, and compliance with psychotropic medications if prescribed.;Long Term: Emotional wellbeing is indicated by absence of clinically significant psychosocial distress or social isolation.             Core Components/Risk Factors/Patient Goals Review:    Core Components/Risk Factors/Patient Goals at Discharge (Final Review):    ITP Comments:  ITP Comments     Row Name 11/01/22 1023           ITP Comments Dr. Armanda Magic medical director. Introduction to [facility] education/ intesnive cardiac rehab. Initial orientation packet reviewed with patient.                Comments: Participant attended orientation for the cardiac rehabilitation program on  11/01/2022  to perform initial intake and exercise walk test. Patient introduced to the [facility] education and orientation packet was reviewed. Completed 6-minute walk test, measurements, initial ITP, and exercise prescription. Vital signs stable. Telemetry-normal sinus rhythm, asymptomatic.   Service time was from 1000 to 1200.  Jonna Coup, MS 11/01/2022 2:45 PM

## 2022-11-06 ENCOUNTER — Encounter (HOSPITAL_COMMUNITY)
Admission: RE | Admit: 2022-11-06 | Discharge: 2022-11-06 | Disposition: A | Discharge status: Home / Self Care | Payer: PRIVATE HEALTH INSURANCE | Source: Ambulatory Visit | Attending: Cardiology | Admitting: Cardiology

## 2022-11-06 DIAGNOSIS — Z955 Presence of coronary angioplasty implant and graft: Secondary | ICD-10-CM

## 2022-11-06 NOTE — Progress Notes (Signed)
Daily Session Note  Patient Details  Name: Kathryn [name] MRN: [id number] Date of Birth: April 27, 1961 Referring Provider:   Flowsheet Row INTENSIVE CARDIAC REHAB ORIENT from 11/01/2022 in [facility]  Referring Provider Olga Millers, MD       Encounter Date: 11/06/2022  Check In:  Session Check In - 11/06/22 1438       Check-In   Supervising physician immediately available to respond to emergencies [facility] MD immediately available    Physician(s) Eligha Bridegroom, NP    Location [facility]    Staff Present Velora Mediate, RN, MSN;Mary Bastin, RN, Marton Redwood, MS, ACSM-CEP, CCRP, Exercise Physiologist;Olinty Peggye Pitt, MS, ACSM-CEP, Exercise Physiologist;Casey Thedore Mins, RN, BSN    Virtual Visit No    Medication changes reported     No    Fall or balance concerns reported    No    Tobacco Cessation No Change    Warm-up and Cool-down Performed as group-led instruction    Resistance Training Performed Yes    VAD Patient? No    PAD/SET Patient? No      Pain Assessment   Currently in Pain? No/denies    Pain Score 0-No pain    Multiple Pain Sites No             Capillary Blood Glucose: No results found for this or any previous visit (from the past 24 hour(s)).   Exercise Prescription Changes - 11/06/22 1458       Response to Exercise   Blood Pressure (Admit) 124/72    Blood Pressure (Exercise) 168/92    Blood Pressure (Exit) 132/72    Heart Rate (Admit) 70 bpm    Heart Rate (Exercise) 109 bpm    Heart Rate (Exit) 77 bpm    Rating of Perceived Exertion (Exercise) 13    Symptoms None    Comments Off to a great start with exercise.    Duration Continue with 30 min of aerobic exercise without signs/symptoms of physical distress.    Intensity THRR unchanged      Progression   Progression Continue to progress workloads to maintain intensity without signs/symptoms of physical  distress.    Average METs 2.9      Resistance Training   Training Prescription Yes    Weight 3    Reps 10-15    Time 10 Minutes      Interval Training   Interval Training No      Treadmill   MPH 3    Grade 0    Minutes 15    METs 3.3      Arm Ergometer   Level 1    Watts 16    RPM 69    Minutes 15    METs 2.5             Social History   Tobacco Use  Smoking Status Never  Smokeless Tobacco Never    Goals Met:  Exercise tolerated well No report of concerns or symptoms today Strength training completed today  Goals Unmet:  Not Applicable  Comments: Pt started cardiac rehab today.  Pt tolerated light exercise without difficulty. VSS, telemetry-Sinus Rhythm, asymptomatic.  Medication list reconciled. Pt denies barriers to medicaiton compliance.  PSYCHOSOCIAL ASSESSMENT:  PHQ-13. Will review QOL and PHQ9 in the upcoming week    Pt enjoys country music and bowling.   Pt oriented to exercise equipment and routine.    Understanding verbalized.  Harrell Gave RN BSN    Dr. Fransico Him is Medical Director for Cardiac Rehab at [facility].

## 2022-11-08 ENCOUNTER — Encounter (HOSPITAL_COMMUNITY)
Admission: RE | Admit: 2022-11-08 | Discharge: 2022-11-08 | Disposition: A | Discharge status: Home / Self Care | Payer: PRIVATE HEALTH INSURANCE | Source: Ambulatory Visit | Attending: Cardiology | Admitting: Cardiology

## 2022-11-08 DIAGNOSIS — Z955 Presence of coronary angioplasty implant and graft: Secondary | ICD-10-CM | POA: Diagnosis not present

## 2022-11-08 NOTE — Progress Notes (Signed)
QUALITY OF LIFE SCORE REVIEW  Pt completed Quality of Life survey as a participant in Cardiac Rehab.  Scores 21.0 or below are considered low.  Pt score very low in several areas Overall 20.70, Health and Function 18.4, socioeconomic 22.86, physiological and spiritual 21.79, family 23.63. Patient quality of life slightly altered by physical constraints which limits ability to perform as prior to recent cardiac illness. Kathryn [name] admits to recently experiencing some depression due to her recent stent placement. Kathryn [name] says she is ina better place this week. Kathryn [name] is interested in receiving counseling and plans to discuss with her PCP, Kathryn [name] at her upcoming appointment with her in the beginning of May.. I gave Kathryn [name] a copy of her QOL and PHQ 9 forms to review with [name].  Offered emotional support and reassurance.  Will continue to monitor and intervene as necessary. Thayer Headings RN BSN

## 2022-11-10 ENCOUNTER — Encounter (HOSPITAL_COMMUNITY)
Admission: RE | Admit: 2022-11-10 | Discharge: 2022-11-10 | Disposition: A | Discharge status: Home / Self Care | Payer: PRIVATE HEALTH INSURANCE | Source: Ambulatory Visit | Attending: Cardiology | Admitting: Cardiology

## 2022-11-10 DIAGNOSIS — Z955 Presence of coronary angioplasty implant and graft: Secondary | ICD-10-CM

## 2022-11-13 ENCOUNTER — Encounter (HOSPITAL_COMMUNITY)
Admission: RE | Admit: 2022-11-13 | Discharge: 2022-11-13 | Disposition: A | Discharge status: Home / Self Care | Payer: PRIVATE HEALTH INSURANCE | Source: Ambulatory Visit | Attending: Cardiology | Admitting: Cardiology

## 2022-11-13 DIAGNOSIS — Z955 Presence of coronary angioplasty implant and graft: Secondary | ICD-10-CM | POA: Diagnosis not present

## 2022-11-15 ENCOUNTER — Encounter (HOSPITAL_COMMUNITY)
Admission: RE | Admit: 2022-11-15 | Discharge: 2022-11-15 | Disposition: A | Discharge status: Home / Self Care | Payer: PRIVATE HEALTH INSURANCE | Source: Ambulatory Visit | Attending: Cardiology | Admitting: Cardiology

## 2022-11-15 DIAGNOSIS — Z955 Presence of coronary angioplasty implant and graft: Secondary | ICD-10-CM | POA: Insufficient documentation

## 2022-11-17 ENCOUNTER — Encounter (HOSPITAL_COMMUNITY)
Admission: RE | Admit: 2022-11-17 | Discharge: 2022-11-17 | Disposition: A | Discharge status: Home / Self Care | Payer: PRIVATE HEALTH INSURANCE | Source: Ambulatory Visit | Attending: Cardiology | Admitting: Cardiology

## 2022-11-17 DIAGNOSIS — Z955 Presence of coronary angioplasty implant and graft: Secondary | ICD-10-CM

## 2022-11-20 ENCOUNTER — Encounter (HOSPITAL_COMMUNITY)
Admission: RE | Admit: 2022-11-20 | Discharge: 2022-11-20 | Disposition: A | Discharge status: Home / Self Care | Payer: PRIVATE HEALTH INSURANCE | Source: Ambulatory Visit | Attending: Cardiology | Admitting: Cardiology

## 2022-11-20 DIAGNOSIS — Z955 Presence of coronary angioplasty implant and graft: Secondary | ICD-10-CM

## 2022-11-22 ENCOUNTER — Encounter (HOSPITAL_COMMUNITY)
Admission: RE | Admit: 2022-11-22 | Discharge: 2022-11-22 | Disposition: A | Discharge status: Home / Self Care | Payer: PRIVATE HEALTH INSURANCE | Source: Ambulatory Visit | Attending: Cardiology | Admitting: Cardiology

## 2022-11-22 DIAGNOSIS — Z955 Presence of coronary angioplasty implant and graft: Secondary | ICD-10-CM

## 2022-11-22 NOTE — Progress Notes (Signed)
Cardiac Individual Treatment Plan  Patient Details  Name: Kathryn [name] MRN: [id number] Date of Birth: Jul 28, 1960 Referring Provider:   Flowsheet Row INTENSIVE CARDIAC REHAB ORIENT from 11/01/2022 in [facility]  Referring Provider Olga Millers, MD       Initial Encounter Date:  Flowsheet Row INTENSIVE CARDIAC REHAB ORIENT from 11/01/2022 in [facility]  Date 11/01/22       Visit Diagnosis: S/P drug eluting coronary stent placement  Patient's Home Medications on Admission:  Current Outpatient Medications:    ADDERALL XR 30 MG 24 hr capsule, Take 30 mg by mouth daily., Disp: , Rfl:    albuterol (VENTOLIN HFA) 108 (90 Base) MCG/ACT inhaler, Inhale 1 puff into the lungs every 6 (six) hours as needed for wheezing or shortness of breath., Disp: , Rfl:    ALPRAZolam (XANAX) 0.5 MG tablet, Take 0.5 mg by mouth at bedtime as needed for anxiety., Disp: , Rfl:    amLODipine (NORVASC) 10 MG tablet, Take 1 tablet (10 mg total) by mouth daily., Disp: 90 tablet, Rfl: 3   aspirin 81 MG tablet, Take 81 mg by mouth daily., Disp: , Rfl:    Brimonidine Tartrate (LUMIFY) 0.025 % SOLN, Place 1 drop into both eyes 4 (four) times daily as needed (dry eyes)., Disp: , Rfl:    buPROPion (WELLBUTRIN XL) 300 MG 24 hr tablet, Take 300 mg by mouth every morning., Disp: , Rfl:    cholecalciferol (VITAMIN D3) 25 MCG (1000 UNIT) tablet, Take 1,000 Units by mouth daily., Disp: , Rfl:    clobetasol (TEMOVATE) 0.05 % external solution, Apply 1 Application topically 2 (two) times daily as needed (irritation)., Disp: , Rfl:    clopidogrel (PLAVIX) 75 MG tablet, Take 1 tablet (75 mg total) by mouth daily., Disp: 90 tablet, Rfl: 3   cyanocobalamin (VITAMIN B12) 1000 MCG tablet, Take 3,000 mcg by mouth daily., Disp: , Rfl:    desvenlafaxine (PRISTIQ) 100 MG 24 hr tablet, Take 100 mg by mouth every morning.,  Disp: , Rfl:    levothyroxine (SYNTHROID) 112 MCG tablet, Take 112 mcg by mouth daily before breakfast., Disp: , Rfl:    losartan (COZAAR) 100 MG tablet, Take 100 mg by mouth daily., Disp: , Rfl:    MAGNESIUM GLYCINATE PO, Take 420 mg by mouth daily., Disp: , Rfl:    Melatonin 10 MG CHEW, Chew 10 mg by mouth at bedtime., Disp: , Rfl:    nitroGLYCERIN (NITROSTAT) 0.4 MG SL tablet, Place 1 tablet (0.4 mg total) under the tongue every 5 (five) minutes as needed., Disp: 25 tablet, Rfl: 2   Potassium Chloride ER 20 MEQ TBCR, TAKE 1 TABLET BY MOUTH TWO  TIMES DAILY, Disp: 30 tablet, Rfl: 0   rosuvastatin (CRESTOR) 40 MG tablet, TAKE 1 TABLET DAILY (SCHEDULE AN APPOINTMENT FOR FURTHER REFILLS), Disp: 90 tablet, Rfl: 3   triamterene-hydrochlorothiazide (MAXZIDE) 75-50 MG tablet, Take 1 tablet by mouth daily. Restart this after FU with PCP, Disp: , Rfl:    valACYclovir (VALTREX) 500 MG tablet, Take 500 mg by mouth daily as needed (for outbreaks). , Disp: , Rfl:   Past Medical History: Past Medical History:  Diagnosis Date   AKI (acute kidney injury) (HCC) 01/21/2019   Anxiety    GERD (gastroesophageal reflux disease)    Graves disease    with hyperthyroidism diagnosed at age 52 (treated with propylthiouracil for 2 years)  Hyperlipidemia    Hypertension    Hypothyroidism    Sleep disturbance     Tobacco Use: Social History   Tobacco Use  Smoking Status Never  Smokeless Tobacco Never    Labs: Review Flowsheet  More data may exist      Latest Ref Rng & Units 12/10/2013 01/21/2019 10/05/2020 06/30/2021 10/30/2022  Labs for ITP Cardiac and Pulmonary Rehab  Cholestrol 100 - 199 mg/dL - - 811  914  782   LDL (calc) 0 - 99 mg/dL - - 53  50  58   HDL-C >39 mg/dL - - 51  52  43   Trlycerides 0 - 149 mg/dL - - 59  82  98   Hemoglobin A1c 4.8 - 5.6 % 5.9  5.8  - - -    Capillary Blood Glucose: Lab Results  Component Value Date   GLUCAP 102 (H) 01/23/2019   GLUCAP 119 (H) 01/22/2019   GLUCAP  123 (H) 01/21/2019   GLUCAP 113 (H) 01/21/2019   GLUCAP 102 (H) 01/21/2019     Exercise Target Goals: Exercise Program Goal: Individual exercise prescription set using results from initial 6 min walk test and THRR while considering  patient's activity barriers and safety.   Exercise Prescription Goal: Initial exercise prescription builds to 30-45 minutes a day of aerobic activity, 2-3 days per week.  Home exercise guidelines will be given to patient during program as part of exercise prescription that the participant will acknowledge.  Activity Barriers & Risk Stratification:  Activity Barriers & Cardiac Risk Stratification - 11/01/22 1313       Activity Barriers & Cardiac Risk Stratification   Activity Barriers Neck/Spine Problems    Cardiac Risk Stratification High   <5 METs on            6 Minute Walk:  6 Minute Walk     Row Name 11/01/22 1312         6 Minute Walk   Phase Initial     Distance 1800 feet     Walk Time 6 minutes     # of Rest Breaks 0     MPH 3.41     METS 4.06     RPE 12     Perceived Dyspnea  0     VO2 Peak 14.23     Symptoms No     Resting HR 64 bpm     Resting BP 112/60     Resting Oxygen Saturation  97 %     Exercise Oxygen Saturation  during 6 min walk 98 %     Max Ex. HR 92 bpm     Max Ex. BP 132/68     2 Minute Post BP 120/66              Oxygen Initial Assessment:   Oxygen Re-Evaluation:   Oxygen Discharge (Final Oxygen Re-Evaluation):   Initial Exercise Prescription:  Initial Exercise Prescription - 11/01/22 1300       Date of Initial Exercise RX and Referring Provider   Date 11/01/22    Referring Provider Olga Millers, MD    Expected Discharge Date 12/15/22      Treadmill   MPH 3    Grade 0    Minutes 15    METs 3.5      Arm Ergometer   Level 1    Watts 20    RPM 60    Minutes 15    METs 2.5  Prescription Details   Frequency (times per week) 3    Duration Progress to 30 minutes of  continuous aerobic without signs/symptoms of physical distress      Intensity   THRR 40-80% of Max Heartrate 63-126    Ratings of Perceived Exertion 11-13    Perceived Dyspnea 0-4      Progression   Progression Continue progressive overload as per policy without signs/symptoms or physical distress.      Resistance Training   Training Prescription Yes    Weight 3    Reps 10-15             Perform Capillary Blood Glucose checks as needed.  Exercise Prescription Changes:   Exercise Prescription Changes     Row Name 11/06/22 1458 11/20/22 1630           Response to Exercise   Blood Pressure (Admit) 124/72 130/70      Blood Pressure (Exercise) 168/92 160/78      Blood Pressure (Exit) 132/72 114/60      Heart Rate (Admit) 70 bpm 90 bpm      Heart Rate (Exercise) 109 bpm 132 bpm      Heart Rate (Exit) 77 bpm 91 bpm      Rating of Perceived Exertion (Exercise) 13 11      Symptoms None None      Comments Off to a great start with exercise. Reviewed MET's      Duration Continue with 30 min of aerobic exercise without signs/symptoms of physical distress. Continue with 30 min of aerobic exercise without signs/symptoms of physical distress.      Intensity THRR unchanged THRR unchanged        Progression   Progression Continue to progress workloads to maintain intensity without signs/symptoms of physical distress. Continue to progress workloads to maintain intensity without signs/symptoms of physical distress.      Average METs 2.9 3.59        Resistance Training   Training Prescription Yes No      Weight 3 --      Reps 10-15 --      Time 10 Minutes --        Interval Training   Interval Training No No        Treadmill   MPH 3 3.3      Grade 0 1      Minutes 15 15      METs 3.3 3.98        Arm Ergometer   Level 1 1.5      Watts 16 23      RPM 69 84      Minutes 15 15      METs 2.5 3.2               Exercise Comments:   Exercise Comments     Row Name  11/06/22 1605 11/22/22 1338         Exercise Comments Patient tolerated 1st session of exercise well without symptoms. Reviewed MET's with pt today. Pt tolerated exercise well with an average MET level of 3.59. Pt is progressing well so far, will continue to monitor and progress as tolerated without sign or symptom               Exercise Goals and Review:   Exercise Goals     Row Name 11/01/22 1028             Exercise Goals   Increase Physical Activity  Yes       Intervention Provide advice, education, support and counseling about physical activity/exercise needs.;Develop an individualized exercise prescription for aerobic and resistive training based on initial evaluation findings, risk stratification, comorbidities and participant's personal goals.       Expected Outcomes Short Term: Attend rehab on a regular basis to increase amount of physical activity.;Long Term: Exercising regularly at least 3-5 days a week.;Long Term: Add in home exercise to make exercise part of routine and to increase amount of physical activity.       Increase Strength and Stamina Yes       Intervention Develop an individualized exercise prescription for aerobic and resistive training based on initial evaluation findings, risk stratification, comorbidities and participant's personal goals.;Provide advice, education, support and counseling about physical activity/exercise needs.       Expected Outcomes Short Term: Increase workloads from initial exercise prescription for resistance, speed, and METs.;Short Term: Perform resistance training exercises routinely during rehab and add in resistance training at home;Long Term: Improve cardiorespiratory fitness, muscular endurance and strength as measured by increased METs and functional capacity ( )       Able to understand and use rate of perceived exertion (RPE) scale Yes       Intervention Provide education and explanation on how to use RPE scale       Expected  Outcomes Short Term: Able to use RPE daily in rehab to express subjective intensity level;Long Term:  Able to use RPE to guide intensity level when exercising independently       Knowledge and understanding of Target Heart Rate Range (THRR) Yes       Intervention Provide education and explanation of THRR including how the numbers were predicted and where they are located for reference       Expected Outcomes Short Term: Able to state/look up THRR;Short Term: Able to use daily as guideline for intensity in rehab;Long Term: Able to use THRR to govern intensity when exercising independently       Understanding of Exercise Prescription Yes       Intervention Provide education, explanation, and written materials on patient's individual exercise prescription       Expected Outcomes Short Term: Able to explain program exercise prescription;Long Term: Able to explain home exercise prescription to exercise independently                Exercise Goals Re-Evaluation :  Exercise Goals Re-Evaluation     Row Name 11/06/22 1605             Exercise Goal Re-Evaluation   Exercise Goals Review Increase Physical Activity;Able to understand and use rate of perceived exertion (RPE) scale;Increase Strength and Stamina       Comments Patient able to understand and use RPE scale appropriately.       Expected Outcomes Progress workloads as tolerated to help increase strength and stamina.                Discharge Exercise Prescription (Final Exercise Prescription Changes):  Exercise Prescription Changes - 11/20/22 1630       Response to Exercise   Blood Pressure (Admit) 130/70    Blood Pressure (Exercise) 160/78    Blood Pressure (Exit) 114/60    Heart Rate (Admit) 90 bpm    Heart Rate (Exercise) 132 bpm    Heart Rate (Exit) 91 bpm    Rating of Perceived Exertion (Exercise) 11    Symptoms None    Comments Reviewed MET's  Duration Continue with 30 min of aerobic exercise without signs/symptoms  of physical distress.    Intensity THRR unchanged      Progression   Progression Continue to progress workloads to maintain intensity without signs/symptoms of physical distress.    Average METs 3.59      Resistance Training   Training Prescription No      Interval Training   Interval Training No      Treadmill   MPH 3.3    Grade 1    Minutes 15    METs 3.98      Arm Ergometer   Level 1.5    Watts 23    RPM 84    Minutes 15    METs 3.2             Nutrition:  Target Goals: Understanding of nutrition guidelines, daily intake of sodium 1500mg , cholesterol 200mg , calories 30% from fat and 7% or less from saturated fats, daily to have 5 or more servings of fruits and vegetables.  Biometrics:  Pre Biometrics - 11/01/22 1026       Pre Biometrics   Waist Circumference 37.5 inches    Hip Circumference 41.5 inches    Waist to Hip Ratio 0.9 %    Triceps Skinfold 27 mm    % Body Fat 39.8 %    Grip Strength 28 kg    Flexibility 17.5 in    Single Leg Stand 22.56 seconds              Nutrition Therapy Plan and Nutrition Goals:  Nutrition Therapy & Goals - 11/06/22 1546       Nutrition Therapy   Diet Heart Healthy Diet    Drug/Food Interactions Statins/Certain Fruits      Personal Nutrition Goals   Nutrition Goal Patient to identify strategies for reducing cardiovascular risk by attending the [employer] education and nutrition series weekly.    Personal Goal #2 Patient to improve diet quality by using the plate method as a guide for meal planning to include lean protein/plant protein, fruits, vegetables, whole grains, nonfat dairy as part of a well-balanced diet.    Comments Bonnetta reports history of prediabetes and CKD3; she is followed by [facility]. She reports that she was taking metformin for prediabetes until 2020. She reports craving sweets and over eating convenience foods. Patient will benefit from participation in intensive cardiac rehab  for nutrition, exercise, and lifestyle modification.      Intervention Plan   Intervention Prescribe, educate and counsel regarding individualized specific dietary modifications aiming towards targeted core components such as weight, hypertension, lipid management, diabetes, heart failure and other comorbidities.;Nutrition handout(s) given to patient.    Expected Outcomes Short Term Goal: Understand basic principles of dietary content, such as calories, fat, sodium, cholesterol and nutrients.;Long Term Goal: Adherence to prescribed nutrition plan.             Nutrition Assessments:  Nutrition Assessments - 11/10/22 1000       Rate Your Plate Scores   Pre Score 39            MEDIFICTS Score Key: ?70 Need to make dietary changes  40-70 Heart Healthy Diet ? 40 Therapeutic Level Cholesterol Diet   Flowsheet Row INTENSIVE CARDIAC REHAB from 11/08/2022 in [facility]  Picture Your Plate Total Score on Admission 39      Picture Your Plate Scores: <16 Unhealthy dietary pattern with much  room for improvement. 41-50 Dietary pattern unlikely to meet recommendations for good health and room for improvement. 51-60 More healthful dietary pattern, with some room for improvement.  >60 Healthy dietary pattern, although there may be some specific behaviors that could be improved.    Nutrition Goals Re-Evaluation:  Nutrition Goals Re-Evaluation     Row Name 11/06/22 1546             Goals   Current Weight 164 lb 7.4 oz (74.6 kg)       Comment A1c 6.1, lipids WNL       Expected Outcome Kaliegh reports history of prediabetes and CKD3; she is followed by [facility]. She reports that she was taking metformin for prediabetes until 2020. She reports craving sweets and over eating convenience foods. Patient will benefit from participation in intensive cardiac rehab for nutrition, exercise, and lifestyle modification.                 Nutrition Goals Re-Evaluation:  Nutrition Goals Re-Evaluation     Row Name 11/06/22 1546             Goals   Current Weight 164 lb 7.4 oz (74.6 kg)       Comment A1c 6.1, lipids WNL       Expected Outcome Tate reports history of prediabetes and CKD3; she is followed by [facility]. She reports that she was taking metformin for prediabetes until 2020. She reports craving sweets and over eating convenience foods. Patient will benefit from participation in intensive cardiac rehab for nutrition, exercise, and lifestyle modification.                Nutrition Goals Discharge (Final Nutrition Goals Re-Evaluation):  Nutrition Goals Re-Evaluation - 11/06/22 1546       Goals   Current Weight 164 lb 7.4 oz (74.6 kg)    Comment A1c 6.1, lipids WNL    Expected Outcome Henretta reports history of prediabetes and CKD3; she is followed by [facility]. She reports that she was taking metformin for prediabetes until 2020. She reports craving sweets and over eating convenience foods. Patient will benefit from participation in intensive cardiac rehab for nutrition, exercise, and lifestyle modification.             Psychosocial: Target Goals: Acknowledge presence or absence of significant depression and/or stress, maximize coping skills, provide positive support system. Participant is able to verbalize types and ability to use techniques and skills needed for reducing stress and depression.  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 She says that she has a hard time allowing  others to do things for her and tends to take on more than she is capable of. This causes her to feel anxious at times and keeps her awake at night because she cannot turn off her thoughts to relax. She shared that at times she feels her heart is racing and has shared that with her cardiologist.      Family Dynamics   Good Support System? Yes   Nicholina has her sisters and friends   Comments Lillieana shared that she has feelings of depression related  to some recent life changes. She is actively going through a divorce, and her son and sister just moved out of her home. She said that while it is nice to not support them any longer, it is also a big change that she has struggled with. 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 She stated that she feels like her medications are no longer working for her as well as they used to and she is interested in going back to a Veterinary surgeon. She has tried asking her friends for places to go, but they were no longer taking patients. Nkechi was visibly emotional, support and reassurance was offered. She was encouraged to speak with her PCP about options for counseling as well as adjustments to her medications. Wynetta stated she would speak to her PCP.      Barriers   Psychosocial barriers to participate in program The patient should benefit from training in stress management and relaxation.      Screening Interventions   Interventions Encouraged to exercise;To provide support and resources with identified psychosocial needs;Provide feedback about the scores to participant    Expected Outcomes Short Term goal: Utilizing psychosocial counselor, staff and physician to assist with identification of specific Stressors or current issues interfering with healing process. Setting  desired goal for each stressor or current issue identified.;Long Term Goal: Stressors or current issues are controlled or eliminated.;Short Term goal: Identification and review with participant of any Quality of Life or Depression concerns found by scoring the questionnaire.;Long Term goal: The participant improves quality of Life and PHQ9 Scores as seen by post scores and/or verbalization of changes             Quality of Life Scores:  Quality of Life - 11/01/22 1321       Quality of Life   Select Quality of Life      Quality of Life Scores   Health/Function Pre 18.4 %    Socioeconomic Pre 22.86 %    Psych/Spiritual Pre 21.79 %    Family Pre 23.63 %    GLOBAL Pre 20.7 %            Scores of 19 and below usually indicate a poorer quality of life in these areas.  A difference of  2-3 points is a clinically meaningful difference.  A difference of 2-3 points in the total score of the Quality of Life Index has been associated with significant improvement in overall quality of life, self-image, physical symptoms, and general health in studies assessing change in quality of life.  PHQ-9: Review Flowsheet       11/01/2022  Depression screen PHQ 2/9  Decreased Interest 1  Down, Depressed, Hopeless 1  PHQ - 2 Score 2  Altered sleeping 3  Tired, decreased energy 2  Change in appetite 1  Feeling bad or failure about yourself  1  Trouble concentrating 3  Moving slowly or fidgety/restless 1  Suicidal thoughts 0  PHQ-9 Score 13  Difficult doing work/chores Somewhat difficult   Interpretation of Total Score  Total Score Depression Severity:  1-4 = Minimal depression, 5-9 = Mild depression, 10-14 = Moderate depression, 15-19 = Moderately severe depression, 20-27 = Severe depression   Psychosocial Evaluation and Intervention:   Psychosocial Re-Evaluation:  Psychosocial Re-Evaluation  Row Name 11/07/22 0815 11/22/22 1726           Psychosocial Re-Evaluation   Current  issues with Current Depression;Current Stress Concerns;Current Anxiety/Panic;Current Psychotropic Meds;Current Sleep Concerns Current Depression;Current Stress Concerns;Current Anxiety/Panic;Current Psychotropic Meds;Current Sleep Concerns      Comments Will discuss Mozella's QOL and PHQ2-9 in the upcoming week. Reviewed quality of life. Symonne plans to dicuss counselling with her PCP she has an upcoming appointment.      Expected Outcomes Jaquayla will have controlled or decreased depression/ stressors upon completion of intensive cardiac rehab Chattie will have controlled or decreased depression/ stressors upon completion of intensive cardiac rehab      Interventions Stress management education;Encouraged to attend Cardiac Rehabilitation for the exercise;Relaxation education Stress management education;Encouraged to attend Cardiac Rehabilitation for the exercise;Relaxation education      Continue Psychosocial Services  Follow up required by staff Follow up required by staff        Initial Review   Source of Stress Concerns Family;Occupation Family;Occupation      Comments Will continue to monitor and offer support as needed Will continue to monitor and offer support as needed               Psychosocial Discharge (Final Psychosocial Re-Evaluation):  Psychosocial Re-Evaluation - 11/22/22 1726       Psychosocial Re-Evaluation   Current issues with Current Depression;Current Stress Concerns;Current Anxiety/Panic;Current Psychotropic Meds;Current Sleep Concerns    Comments Reviewed quality of life. Marren plans to dicuss counselling with her PCP she has an upcoming appointment.    Expected Outcomes Mery will have controlled or decreased depression/ stressors upon completion of intensive cardiac rehab    Interventions Stress management education;Encouraged to attend Cardiac Rehabilitation for the exercise;Relaxation education    Continue Psychosocial Services  Follow up required by staff      Initial  Review   Source of Stress Concerns Family;Occupation    Comments Will continue to monitor and offer support as needed             Vocational Rehabilitation: Provide vocational rehab assistance to qualifying candidates.   Vocational Rehab Evaluation & Intervention:  Vocational Rehab - 11/01/22 1322       Initial Vocational Rehab Evaluation & Intervention   Assessment shows need for Vocational Rehabilitation No   Dazia is currently working            Education: Education Goals: Education classes will be provided on a weekly basis, covering required topics. Participant will state understanding/return demonstration of topics presented.    Education     Row Name 11/06/22 1400     Education   Cardiac Education Topics Pritikin   Psychologist, forensic Exercise Education   Exercise Education Biomechanial Limitations   Instruction Review Code 1- Verbalizes Understanding   Class Start Time 1407   Class Stop Time 1444   Class Time Calculation (min) 37 min    Row Name 11/08/22 1600     Education   Cardiac Education Topics Pritikin   Customer service manager   Weekly Topic Comforting Weekend Breakfasts   Instruction Review Code 1- Verbalizes Understanding   Class Start Time 1355   Class Stop Time 1442   Class Time Calculation (min) 47 min    Row Name 11/10/22 1500     Education   Cardiac Education Topics Pritikin  Select Core Videos     Core Videos   Educator Dietitian   Select Nutrition   Nutrition Dining Out - Part 1   Instruction Review Code 1- Verbalizes Understanding   Class Start Time 1400   Class Stop Time 1445   Class Time Calculation (min) 45 min    Row Name 11/13/22 1400     Education   Cardiac Education Topics Pritikin   Select Core Videos     Core Videos   Educator Dietitian   Select Nutrition   Nutrition Facts on Fat   Instruction Review Code  1- Verbalizes Understanding   Class Start Time 1400   Class Stop Time 1439   Class Time Calculation (min) 39 min     Secondary school teacher    Row Name 11/15/22 1500     Education   Cardiac Education Topics Pritikin   Customer service manager   Weekly Topic Fast Evening Meals   Instruction Review Code 1- Verbalizes Understanding   Class Start Time 1400   Class Stop Time 1440   Class Time Calculation (min) 40 min    Row Name 11/17/22 1600     Education   Cardiac Education Topics Pritikin   Licensed conveyancer Nutrition   Nutrition Vitamins and Minerals   Instruction Review Code 1- Verbalizes Understanding   Class Start Time 1400   Class Stop Time 1440   Class Time Calculation (min) 40 min    Row Name 11/20/22 1700     Education   Cardiac Education Topics Pritikin   Geographical information systems officer Psychosocial   Psychosocial Workshop Healthy Sleep for a Healthy Heart   Instruction Review Code 1- Verbalizes Understanding   Class Start Time 1405   Class Stop Time 1502   Class Time Calculation (min) 57 min    Row Name 11/22/22 1500     Education   Cardiac Education Topics Pritikin   Customer service manager   Weekly Topic International Cuisine- Spotlight on the [employer] Zones   Instruction Review Code 1- Verbalizes Understanding   Class Start Time 1355   Class Stop Time 1430   Class Time Calculation (min) 35 min            Core Videos: Exercise    Move It!  Clinical staff conducted group or individual video education with verbal and written material and guidebook.  Patient learns the recommended Pritikin exercise program. Exercise with the goal of living a long, healthy life. Some of the health benefits of exercise include controlled diabetes, healthier blood pressure levels,  improved cholesterol levels, improved heart and lung capacity, improved sleep, and better body composition. Everyone should speak with their doctor before starting or changing an exercise routine.  Biomechanical Limitations Clinical staff conducted group or individual video education with verbal and written material and guidebook.  Patient learns how biomechanical limitations can impact exercise and how we can mitigate and possibly overcome limitations to have an impactful and balanced exercise routine.  Body Composition Clinical staff conducted group or individual video education with verbal and written material and guidebook.  Patient learns that body composition (ratio of muscle mass to fat mass) is a key component to assessing overall fitness, rather than  body weight alone. Increased fat mass, especially visceral belly fat, can put [place] at increased risk for metabolic syndrome, type 2 diabetes, heart disease, and even death. It is recommended to combine diet and exercise (cardiovascular and resistance training) to improve your body composition. Seek guidance from your physician and exercise physiologist before implementing an exercise routine.  Exercise Action Plan Clinical staff conducted group or individual video education with verbal and written material and guidebook.  Patient learns the recommended strategies to achieve and enjoy long-term exercise adherence, including variety, self-motivation, self-efficacy, and positive decision making. Benefits of exercise include fitness, good health, weight management, more energy, better sleep, less stress, and overall well-being.  Medical   Heart Disease Risk Reduction Clinical staff conducted group or individual video education with verbal and written material and guidebook.  Patient learns our heart is our most vital organ as it circulates oxygen, nutrients, white blood cells, and hormones throughout the entire body, and carries waste away. Data  supports a plant-based eating plan like the Pritikin Program for its effectiveness in slowing progression of and reversing heart disease. The video provides a number of recommendations to address heart disease.   Metabolic Syndrome and Belly Fat  Clinical staff conducted group or individual video education with verbal and written material and guidebook.  Patient learns what metabolic syndrome is, how it leads to heart disease, and how one can reverse it and keep it from coming back. You have metabolic syndrome if you have 3 of the following 5 criteria: abdominal obesity, high blood pressure, high triglycerides, low HDL cholesterol, and high blood sugar.  Hypertension and Heart Disease Clinical staff conducted group or individual video education with verbal and written material and guidebook.  Patient learns that high blood pressure, or hypertension, is very common in the [place]. Hypertension is largely due to excessive salt intake, but other important risk factors include being overweight, physical inactivity, drinking too much alcohol, smoking, and not eating enough potassium from fruits and vegetables. High blood pressure is a leading risk factor for heart attack, stroke, congestive heart failure, dementia, kidney failure, and premature death. Long-term effects of excessive salt intake include stiffening of the arteries and thickening of heart muscle and organ damage. Recommendations include ways to reduce hypertension and the risk of heart disease.  Diseases of Our Time - Focusing on Diabetes Clinical staff conducted group or individual video education with verbal and written material and guidebook.  Patient learns why the best way to stop diseases of our time is prevention, through food and other lifestyle changes. Medicine (such as prescription pills and surgeries) is often only a Band-Aid on the problem, not a long-term solution. Most common diseases of our time include obesity, type 2  diabetes, hypertension, heart disease, and cancer. The Pritikin Program is recommended and has been proven to help reduce, reverse, and/or prevent the damaging effects of metabolic syndrome.  Nutrition   Overview of the Pritikin Eating Plan  Clinical staff conducted group or individual video education with verbal and written material and guidebook.  Patient learns about the Pritikin Eating Plan for disease risk reduction. The Pritikin Eating Plan emphasizes a wide variety of unrefined, minimally-processed carbohydrates, like fruits, vegetables, whole grains, and legumes. Go, Caution, and Stop food choices are explained. Plant-based and lean animal proteins are emphasized. Rationale provided for low sodium intake for blood pressure control, low added sugars for blood sugar stabilization, and low added fats and oils for coronary artery disease risk reduction and weight management.  Calorie Density  Clinical staff conducted group or individual video education with verbal and written material and guidebook.  Patient learns about calorie density and how it impacts the Pritikin Eating Plan. Knowing the characteristics of the food you choose will help you decide whether those foods will lead to weight gain or weight loss, and whether you want to consume more or less of them. Weight loss is usually a side effect of the Pritikin Eating Plan because of its focus on low calorie-dense foods.  Label Reading  Clinical staff conducted group or individual video education with verbal and written material and guidebook.  Patient learns about the Pritikin recommended label reading guidelines and corresponding recommendations regarding calorie density, added sugars, sodium content, and whole grains.  Dining Out - Part 1  Clinical staff conducted group or individual video education with verbal and written material and guidebook.  Patient learns that restaurant meals can be sabotaging because they can be so high in  calories, fat, sodium, and/or sugar. Patient learns recommended strategies on how to positively address this and avoid unhealthy pitfalls.  Facts on Fats  Clinical staff conducted group or individual video education with verbal and written material and guidebook.  Patient learns that lifestyle modifications can be just as effective, if not more so, as many medications for lowering your risk of heart disease. A Pritikin lifestyle can help to reduce your risk of inflammation and atherosclerosis (cholesterol build-up, or plaque, in the artery walls). Lifestyle interventions such as dietary choices and physical activity address the cause of atherosclerosis. A review of the types of fats and their impact on blood cholesterol levels, along with dietary recommendations to reduce fat intake is also included.  Nutrition Action Plan  Clinical staff conducted group or individual video education with verbal and written material and guidebook.  Patient learns how to incorporate Pritikin recommendations into their lifestyle. Recommendations include planning and keeping personal health goals in mind as an important part of their success.  Healthy Mind-Set    Healthy Minds, Bodies, Hearts  Clinical staff conducted group or individual video education with verbal and written material and guidebook.  Patient learns how to identify when they are stressed. Video will discuss the impact of that stress, as well as the many benefits of stress management. Patient will also be introduced to stress management techniques. The way we think, act, and feel has an impact on our hearts.  How Our Thoughts Can Heal Our Hearts  Clinical staff conducted group or individual video education with verbal and written material and guidebook.  Patient learns that negative thoughts can cause depression and anxiety. This can result in negative lifestyle behavior and serious health problems. Cognitive behavioral therapy is an effective method to  help control our thoughts in order to change and improve our emotional outlook.  Additional Videos:  Exercise    Improving Performance  Clinical staff conducted group or individual video education with verbal and written material and guidebook.  Patient learns to use a non-linear approach by alternating intensity levels and lengths of time spent exercising to help burn more calories and lose more body fat. Cardiovascular exercise helps improve heart health, metabolism, hormonal balance, blood sugar control, and recovery from fatigue. Resistance training improves strength, endurance, balance, coordination, reaction time, metabolism, and muscle mass. Flexibility exercise improves circulation, posture, and balance. Seek guidance from your physician and exercise physiologist before implementing an exercise routine and learn your capabilities and proper form for all exercise.  Introduction to Yoga  Clinical staff conducted group or individual video education with verbal and written material and guidebook.  Patient learns about yoga, a discipline of the coming together of mind, breath, and body. The benefits of yoga include improved flexibility, improved range of motion, better posture and core strength, increased lung function, weight loss, and positive self-image. Yoga's heart health benefits include lowered blood pressure, healthier heart rate, decreased cholesterol and triglyceride levels, improved immune function, and reduced stress. Seek guidance from your physician and exercise physiologist before implementing an exercise routine and learn your capabilities and proper form for all exercise.  Medical   Aging: Enhancing Your Quality of Life  Clinical staff conducted group or individual video education with verbal and written material and guidebook.  Patient learns key strategies and recommendations to stay in good physical health and enhance quality of life, such as prevention strategies, having an  advocate, securing a Health Care Proxy and Power of Attorney, and keeping a list of medications and system for tracking them. It also discusses how to avoid risk for bone loss.  Biology of Weight Control  Clinical staff conducted group or individual video education with verbal and written material and guidebook.  Patient learns that weight gain occurs because we consume more calories than we burn (eating more, moving less). Even if your body weight is normal, you may have higher ratios of fat compared to muscle mass. Too much body fat puts you at increased risk for cardiovascular disease, heart attack, stroke, type 2 diabetes, and obesity-related cancers. In addition to exercise, following the Pritikin Eating Plan can help reduce your risk.  Decoding Lab Results  Clinical staff conducted group or individual video education with verbal and written material and guidebook.  Patient learns that lab test reflects one measurement whose values change over time and are influenced by many factors, including medication, stress, sleep, exercise, food, hydration, pre-existing medical conditions, and more. It is recommended to use the knowledge from this video to become more involved with your lab results and evaluate your numbers to speak with your doctor.   Diseases of Our Time - Overview  Clinical staff conducted group or individual video education with verbal and written material and guidebook.  Patient learns that according to the CDC, 50% to 70% of chronic diseases (such as obesity, type 2 diabetes, elevated lipids, hypertension, and heart disease) are avoidable through lifestyle improvements including healthier food choices, listening to satiety cues, and increased physical activity.  Sleep Disorders Clinical staff conducted group or individual video education with verbal and written material and guidebook.  Patient learns how good quality and duration of sleep are important to overall health and  well-being. Patient also learns about sleep disorders and how they impact health along with recommendations to address them, including discussing with a physician.  Nutrition  Dining Out - Part 2 Clinical staff conducted group or individual video education with verbal and written material and guidebook.  Patient learns how to plan ahead and communicate in order to maximize their dining experience in a healthy and nutritious manner. Included are recommended food choices based on the type of restaurant the patient is visiting.   Fueling a Banker conducted group or individual video education with verbal and written material and guidebook.  There is a strong connection between our food choices and our health. Diseases like obesity and type 2 diabetes are very prevalent and are in large-part due to lifestyle choices. The Pritikin Eating Plan provides plenty of food  and hunger-curbing satisfaction. It is easy to follow, affordable, and helps reduce health risks.  Menu Workshop  Clinical staff conducted group or individual video education with verbal and written material and guidebook.  Patient learns that restaurant meals can sabotage health goals because they are often packed with calories, fat, sodium, and sugar. Recommendations include strategies to plan ahead and to communicate with the manager, chef, or server to help order a healthier meal.  Planning Your Eating Strategy  Clinical staff conducted group or individual video education with verbal and written material and guidebook.  Patient learns about the Pritikin Eating Plan and its benefit of reducing the risk of disease. The Pritikin Eating Plan does not focus on calories. Instead, it emphasizes high-quality, nutrient-rich foods. By knowing the characteristics of the foods, we choose, we can determine their calorie density and make informed decisions.  Targeting Your Nutrition Priorities  Clinical staff conducted group or  individual video education with verbal and written material and guidebook.  Patient learns that lifestyle habits have a tremendous impact on disease risk and progression. This video provides eating and physical activity recommendations based on your personal health goals, such as reducing LDL cholesterol, losing weight, preventing or controlling type 2 diabetes, and reducing high blood pressure.  Vitamins and Minerals  Clinical staff conducted group or individual video education with verbal and written material and guidebook.  Patient learns different ways to obtain key vitamins and minerals, including through a recommended healthy diet. It is important to discuss all supplements you take with your doctor.   Healthy Mind-Set    Smoking Cessation  Clinical staff conducted group or individual video education with verbal and written material and guidebook.  Patient learns that cigarette smoking and tobacco addiction pose a serious health risk which affects millions of people. Stopping smoking will significantly reduce the risk of heart disease, lung disease, and many forms of cancer. Recommended strategies for quitting are covered, including working with your doctor to develop a successful plan.  Culinary   Becoming a Set designer conducted group or individual video education with verbal and written material and guidebook.  Patient learns that cooking at home can be healthy, cost-effective, quick, and puts them in control. Keys to cooking healthy recipes will include looking at your recipe, assessing your equipment needs, planning ahead, making it simple, choosing cost-effective seasonal ingredients, and limiting the use of added fats, salts, and sugars.  Cooking - Breakfast and Snacks  Clinical staff conducted group or individual video education with verbal and written material and guidebook.  Patient learns how important breakfast is to satiety and nutrition through the entire  day. Recommendations include key foods to eat during breakfast to help stabilize blood sugar levels and to prevent overeating at meals later in the day. Planning ahead is also a key component.  Cooking - Educational psychologist conducted group or individual video education with verbal and written material and guidebook.  Patient learns eating strategies to improve overall health, including an approach to cook more at home. Recommendations include thinking of animal protein as a side on your plate rather than center stage and focusing instead on lower calorie dense options like vegetables, fruits, whole grains, and plant-based proteins, such as beans. Making sauces in large quantities to freeze for later and leaving the skin on your vegetables are also recommended to maximize your experience.  Cooking - Healthy Salads and Dressing Clinical staff conducted group or individual video education with verbal  and written material and guidebook.  Patient learns that vegetables, fruits, whole grains, and legumes are the foundations of the Pritikin Eating Plan. Recommendations include how to incorporate each of these in flavorful and healthy salads, and how to create homemade salad dressings. Proper handling of ingredients is also covered. Cooking - Soups and [employer] - Soups and Desserts Clinical staff conducted group or individual video education with verbal and written material and guidebook.  Patient learns that Pritikin soups and desserts make for easy, nutritious, and delicious snacks and meal components that are low in sodium, fat, sugar, and calorie density, while high in vitamins, minerals, and filling fiber. Recommendations include simple and healthy ideas for soups and desserts.   Overview     The Pritikin Solution Program Overview Clinical staff conducted group or individual video education with verbal and written material and guidebook.  Patient learns that the results of the  Pritikin Program have been documented in more than 100 articles published in peer-reviewed journals, and the benefits include reducing risk factors for (and, in some cases, even reversing) high cholesterol, high blood pressure, type 2 diabetes, obesity, and more! An overview of the three key pillars of the Pritikin Program will be covered: eating well, doing regular exercise, and having a healthy mind-set.  WORKSHOPS  Exercise: Exercise Basics: Building Your Action Plan Clinical staff led group instruction and group discussion with PowerPoint presentation and patient guidebook. To enhance the learning environment the use of posters, models and videos may be added. At the conclusion of this workshop, patients will comprehend the difference between physical activity and exercise, as well as the benefits of incorporating both, into their routine. Patients will understand the FITT (Frequency, Intensity, Time, and Type) principle and how to use it to build an exercise action plan. In addition, safety concerns and other considerations for exercise and cardiac rehab will be addressed by the presenter. The purpose of this lesson is to promote a comprehensive and effective weekly exercise routine in order to improve patients' overall level of fitness.   Managing Heart Disease: Your Path to a Healthier Heart Clinical staff led group instruction and group discussion with PowerPoint presentation and patient guidebook. To enhance the learning environment the use of posters, models and videos may be added.At the conclusion of this workshop, patients will understand the anatomy and physiology of the heart. Additionally, they will understand how Pritikin's three pillars impact the risk factors, the progression, and the management of heart disease.  The purpose of this lesson is to provide a high-level overview of the heart, heart disease, and how the Pritikin lifestyle positively impacts risk factors.  Exercise  Biomechanics Clinical staff led group instruction and group discussion with PowerPoint presentation and patient guidebook. To enhance the learning environment the use of posters, models and videos may be added. Patients will learn how the structural parts of their bodies function and how these functions impact their daily activities, movement, and exercise. Patients will learn how to promote a neutral spine, learn how to manage pain, and identify ways to improve their physical movement in order to promote healthy living. The purpose of this lesson is to expose patients to common physical limitations that impact physical activity. Participants will learn practical ways to adapt and manage aches and pains, and to minimize their effect on regular exercise. Patients will learn how to maintain good posture while sitting, walking, and lifting.  Balance Training and Fall Prevention  Clinical staff led group instruction and group discussion  with PowerPoint presentation and patient guidebook. To enhance the learning environment the use of posters, models and videos may be added. At the conclusion of this workshop, patients will understand the importance of their sensorimotor skills (vision, proprioception, and the vestibular system) in maintaining their ability to balance as they age. Patients will apply a variety of balancing exercises that are appropriate for their current level of function. Patients will understand the common causes for poor balance, possible solutions to these problems, and ways to modify their physical environment in order to minimize their fall risk. The purpose of this lesson is to teach patients about the importance of maintaining balance as they age and ways to minimize their risk of falling.  WORKSHOPS   Nutrition:  Fueling a Ship broker led group instruction and group discussion with PowerPoint presentation and patient guidebook. To enhance the learning  environment the use of posters, models and videos may be added. Patients will review the foundational principles of the Pritikin Eating Plan and understand what constitutes a serving size in each of the food groups. Patients will also learn Pritikin-friendly foods that are better choices when away from home and review make-ahead meal and snack options. Calorie density will be reviewed and applied to three nutrition priorities: weight maintenance, weight loss, and weight gain. The purpose of this lesson is to reinforce (in a group setting) the key concepts around what patients are recommended to eat and how to apply these guidelines when away from home by planning and selecting Pritikin-friendly options. Patients will understand how calorie density may be adjusted for different weight management goals.  Mindful Eating  Clinical staff led group instruction and group discussion with PowerPoint presentation and patient guidebook. To enhance the learning environment the use of posters, models and videos may be added. Patients will briefly review the concepts of the Pritikin Eating Plan and the importance of low-calorie dense foods. The concept of mindful eating will be introduced as well as the importance of paying attention to internal hunger signals. Triggers for non-hunger eating and techniques for dealing with triggers will be explored. The purpose of this lesson is to provide patients with the opportunity to review the basic principles of the Pritikin Eating Plan, discuss the value of eating mindfully and how to measure internal cues of hunger and fullness using the Hunger Scale. Patients will also discuss reasons for non-hunger eating and learn strategies to use for controlling emotional eating.  Targeting Your Nutrition Priorities Clinical staff led group instruction and group discussion with PowerPoint presentation and patient guidebook. To enhance the learning environment the use of posters, models and  videos may be added. Patients will learn how to determine their genetic susceptibility to disease by reviewing their family history. Patients will gain insight into the importance of diet as part of an overall healthy lifestyle in mitigating the impact of genetics and other environmental insults. The purpose of this lesson is to provide patients with the opportunity to assess their personal nutrition priorities by looking at their family history, their own health history and current risk factors. Patients will also be able to discuss ways of prioritizing and modifying the Pritikin Eating Plan for their highest risk areas  Menu  Clinical staff led group instruction and group discussion with PowerPoint presentation and patient guidebook. To enhance the learning environment the use of posters, models and videos may be added. Using menus brought in from [employer], or printed from [employer], patients will apply the Pritikin dining  out guidelines that were presented in the [employer] video. Patients will also be able to practice these guidelines in a variety of provided scenarios. The purpose of this lesson is to provide patients with the opportunity to practice hands-on learning of the Pritikin Dining Out guidelines with actual menus and practice scenarios.  Label Reading Clinical staff led group instruction and group discussion with PowerPoint presentation and patient guidebook. To enhance the learning environment the use of posters, models and videos may be added. Patients will review and discuss the Pritikin label reading guidelines presented in [employer]'s Label Reading Educational series video. Using fool labels brought in from local grocery stores and markets, patients will apply the label reading guidelines and determine if the packaged food meet the Pritikin guidelines. The purpose of this lesson is to provide patients with the opportunity to review, discuss, and practice  hands-on learning of the Pritikin Label Reading guidelines with actual packaged food labels. Cooking School  Pritikin's [employer] are designed to teach patients ways to prepare quick, simple, and affordable recipes at home. The importance of nutrition's role in chronic disease risk reduction is reflected in its emphasis in the overall Pritikin program. By learning how to prepare essential core Pritikin Eating Plan recipes, patients will increase control over what they eat; be able to customize the flavor of foods without the use of added salt, sugar, or fat; and improve the quality of the food they consume. By learning a set of core recipes which are easily assembled, quickly prepared, and affordable, patients are more likely to prepare more healthy foods at home. These workshops focus on convenient breakfasts, simple entres, side dishes, and desserts which can be prepared with minimal effort and are consistent with nutrition recommendations for cardiovascular risk reduction. Cooking [employer] are taught by a Armed forces logistics/support/administrative officer (RD) who has been trained by the [employer]. The chef or RD has a clear understanding of the importance of minimizing - if not completely eliminating - added fat, sugar, and sodium in recipes. Throughout the series of Cooking School Workshop sessions, patients will learn about healthy ingredients and efficient methods of cooking to build confidence in their capability to prepare    Cooking School weekly topics:  Adding Flavor- Sodium-Free  Fast and Healthy Breakfasts  Powerhouse Plant-Based Proteins  Satisfying Salads and Dressings  Simple Sides and Sauces  International Cuisine-Spotlight on the [employer] Zones  Delicious Desserts  Savory Soups  [employer] - Meals in a Astronomer Appetizers and Snacks  Comforting Weekend Breakfasts  One-Pot Wonders   Fast Evening Meals  Landscape architect Your Pritikin  Plate  WORKSHOPS   Healthy Mindset (Psychosocial):  Focused Goals, Sustainable Changes Clinical staff led group instruction and group discussion with PowerPoint presentation and patient guidebook. To enhance the learning environment the use of posters, models and videos may be added. Patients will be able to apply effective goal setting strategies to establish at least one personal goal, and then take consistent, meaningful action toward that goal. They will learn to identify common barriers to achieving personal goals and develop strategies to overcome them. Patients will also gain an understanding of how our mind-set can impact our ability to achieve goals and the importance of cultivating a positive and growth-oriented mind-set. The purpose of this lesson is to provide patients with a deeper understanding of how to set and achieve personal goals, as well as the tools and strategies needed to overcome  common obstacles which may arise along the way.  From Head to Heart: The Power of a Healthy Outlook  Clinical staff led group instruction and group discussion with PowerPoint presentation and patient guidebook. To enhance the learning environment the use of posters, models and videos may be added. Patients will be able to recognize and describe the impact of emotions and mood on physical health. They will discover the importance of self-care and explore self-care practices which may work for them. Patients will also learn how to utilize the 4 C's to cultivate a healthier outlook and better manage stress and challenges. The purpose of this lesson is to demonstrate to patients how a healthy outlook is an essential part of maintaining good health, especially as they continue their cardiac rehab journey.  Healthy Sleep for a Healthy Heart Clinical staff led group instruction and group discussion with PowerPoint presentation and patient guidebook. To enhance the learning environment the use of posters,  models and videos may be added. At the conclusion of this workshop, patients will be able to demonstrate knowledge of the importance of sleep to overall health, well-being, and quality of life. They will understand the symptoms of, and treatments for, common sleep disorders. Patients will also be able to identify daytime and nighttime behaviors which impact sleep, and they will be able to apply these tools to help manage sleep-related challenges. The purpose of this lesson is to provide patients with a general overview of sleep and outline the importance of quality sleep. Patients will learn about a few of the most common sleep disorders. Patients will also be introduced to the concept of "sleep hygiene," and discover ways to self-manage certain sleeping problems through simple daily behavior changes. Finally, the workshop will motivate patients by clarifying the links between quality sleep and their goals of heart-healthy living.   Recognizing and Reducing Stress Clinical staff led group instruction and group discussion with PowerPoint presentation and patient guidebook. To enhance the learning environment the use of posters, models and videos may be added. At the conclusion of this workshop, patients will be able to understand the types of stress reactions, differentiate between acute and chronic stress, and recognize the impact that chronic stress has on their health. They will also be able to apply different coping mechanisms, such as reframing negative self-talk. Patients will have the opportunity to practice a variety of stress management techniques, such as deep abdominal breathing, progressive muscle relaxation, and/or guided imagery.  The purpose of this lesson is to educate patients on the role of stress in their lives and to provide healthy techniques for coping with it.  Learning Barriers/Preferences:  Learning Barriers/Preferences - 11/01/22 1322       Learning Barriers/Preferences   Learning  Barriers Sight   glasses   Learning Preferences Computer/Internet;Group Instruction;Individual Instruction;Pictoral;Skilled Demonstration;Verbal Instruction;Video;Written Material             Education Topics:  Knowledge Questionnaire Score:  Knowledge Questionnaire Score - 11/01/22 1028       Knowledge Questionnaire Score   Pre Score 21/24             Core Components/Risk Factors/Patient Goals at Admission:  Personal Goals and Risk Factors at Admission - 11/01/22 1322       Core Components/Risk Factors/Patient Goals on Admission   Hypertension Yes    Intervention Provide education on lifestyle modifcations including regular physical activity/exercise, weight management, moderate sodium restriction and increased consumption of fresh fruit, vegetables, and low fat dairy, alcohol moderation, and  smoking cessation.;Monitor prescription use compliance.    Expected Outcomes Short Term: Continued assessment and intervention until BP is < 140/39mm HG in hypertensive participants. < 130/82mm HG in hypertensive participants with diabetes, heart failure or chronic kidney disease.;Long Term: Maintenance of blood pressure at goal levels.    Lipids Yes    Intervention Provide education and support for participant on nutrition & aerobic/resistive exercise along with prescribed medications to achieve LDL 70mg , HDL >40mg .    Expected Outcomes Short Term: Participant states understanding of desired cholesterol values and is compliant with medications prescribed. Participant is following exercise prescription and nutrition guidelines.;Long Term: Cholesterol controlled with medications as prescribed, with individualized exercise RX and with personalized nutrition plan. Value goals: LDL < 70mg , HDL > 40 mg.    Stress Yes    Intervention Offer individual and/or small group education and counseling on adjustment to heart disease, stress management and health-related lifestyle change. Teach and support  self-help strategies.;Refer participants experiencing significant psychosocial distress to appropriate mental health specialists for further evaluation and treatment. When possible, include family members and significant others in education/counseling sessions.    Expected Outcomes Short Term: Participant demonstrates changes in health-related behavior, relaxation and other stress management skills, ability to obtain effective social support, and compliance with psychotropic medications if prescribed.;Long Term: Emotional wellbeing is indicated by absence of clinically significant psychosocial distress or social isolation.             Core Components/Risk Factors/Patient Goals Review:   Goals and Risk Factor Review     Row Name 11/07/22 0820 11/22/22 1728           Core Components/Risk Factors/Patient Goals Review   Personal Goals Review Weight Management/Obesity;Hypertension;Lipids;Stress Weight Management/Obesity;Hypertension;Lipids;Stress      Review Alphonsine started intensive cardiac rehab on 11/06/22. Shaneal did well with exercise. Vital signs were stable Deaunna is doing  well with exercise atintensive cardiac rehab . Vital signs were stable      Expected Outcomes Maraiya will continue to participate in intensive cardiac rehab for exercise, nutrition and lifestyle modifcations Olee will continue to participate in intensive cardiac rehab for exercise, nutrition and lifestyle modifcations               Core Components/Risk Factors/Patient Goals at Discharge (Final Review):   Goals and Risk Factor Review - 11/22/22 1728       Core Components/Risk Factors/Patient Goals Review   Personal Goals Review Weight Management/Obesity;Hypertension;Lipids;Stress    Review Denesia is doing  well with exercise atintensive cardiac rehab . Vital signs were stable    Expected Outcomes Fritzie will continue to participate in intensive cardiac rehab for exercise, nutrition and lifestyle modifcations              ITP Comments:  ITP Comments     Row Name 11/01/22 1023 11/07/22 0812 11/22/22 1726       ITP Comments Dr. Armanda Magic medical director. Introduction to prititkin education/ intesnive cardiac rehab. Initial orientation packet reviewed with patient. 30 day ITP Review. Ketti started intensive cardiac rehab on 11/06/22 and did well with exercise. 30 day ITP Review. Natalia has good attendance and participation in intensive cardiac rehab.              Comments: See ITP comments.Thayer Headings RN BSN

## 2022-11-24 ENCOUNTER — Encounter (HOSPITAL_COMMUNITY)
Admission: RE | Admit: 2022-11-24 | Discharge: 2022-11-24 | Disposition: A | Discharge status: Home / Self Care | Payer: PRIVATE HEALTH INSURANCE | Source: Ambulatory Visit | Attending: Cardiology | Admitting: Cardiology

## 2022-11-24 DIAGNOSIS — Z955 Presence of coronary angioplasty implant and graft: Secondary | ICD-10-CM | POA: Diagnosis not present

## 2022-11-27 ENCOUNTER — Encounter (HOSPITAL_COMMUNITY)
Admission: RE | Admit: 2022-11-27 | Discharge: 2022-11-27 | Disposition: A | Discharge status: Home / Self Care | Payer: PRIVATE HEALTH INSURANCE | Source: Ambulatory Visit | Attending: Cardiology | Admitting: Cardiology

## 2022-11-27 DIAGNOSIS — Z955 Presence of coronary angioplasty implant and graft: Secondary | ICD-10-CM

## 2022-11-27 NOTE — Progress Notes (Signed)
CARDIAC REHAB PHASE 2  Reviewed home exercise with pt today. Pt is tolerating exercise well. Pt will continue to exercise on her own by walking, hand weights and resistance bands for 20-30 minutes per session 3-4 days a week in addition to the 3 days in [facility]. Advised pt on THRR, RPE scale, hydration and temperature/humidity precautions. Reinforced NTG use, S/S to stop exercise and when to call MD vs 911. Encouraged warm up cool down and stretches with exercise sessions. Pt verbalized understanding, all questions were answered and pt was given a copy to take home.    Harrie Jeans ACSM-CEP 11/27/2022 4:47 PM

## 2022-11-29 ENCOUNTER — Encounter (HOSPITAL_COMMUNITY): Payer: PRIVATE HEALTH INSURANCE

## 2022-11-29 ENCOUNTER — Telehealth (HOSPITAL_COMMUNITY): Payer: Self-pay

## 2022-11-29 NOTE — Telephone Encounter (Signed)
-----   Message from Lewayne Bunting, MD sent at 11/27/2022  6:08 PM EDT ----- Regarding: RE: THRR increase request Ok to increase Kathryn [name]  ----- Message ----- From: Harrie Jeans Sent: 11/27/2022   5:36 PM EDT To: Lewayne Bunting, MD Subject: THRR increase request                          CARDIAC REHAB PHASE 2  Good evening,  Kathryn [name] has been in rehab approximately 3 weeks and is doing well.  Blood pressure is within normal limits, but exercise heart rates are beginning to exceed Target Heart Rate Range(THRR) of 63-126 bpm (40%-80% of age predicted max HR).  If no GXT is planned for the near future and MD agrees, request to increase THR to 40% -90% of age predicted max HR 143 bpm.    Harrie Jeans ACSM-CEP 11/27/2022 5:34 PM

## 2022-12-01 ENCOUNTER — Encounter (HOSPITAL_COMMUNITY)
Admission: RE | Admit: 2022-12-01 | Discharge: 2022-12-01 | Disposition: A | Discharge status: Home / Self Care | Payer: PRIVATE HEALTH INSURANCE | Source: Ambulatory Visit | Attending: Cardiology | Admitting: Cardiology

## 2022-12-01 DIAGNOSIS — Z955 Presence of coronary angioplasty implant and graft: Secondary | ICD-10-CM

## 2022-12-04 ENCOUNTER — Encounter (HOSPITAL_COMMUNITY)
Admission: RE | Admit: 2022-12-04 | Discharge: 2022-12-04 | Disposition: A | Discharge status: Home / Self Care | Payer: PRIVATE HEALTH INSURANCE | Source: Ambulatory Visit | Attending: Cardiology | Admitting: Cardiology

## 2022-12-04 DIAGNOSIS — Z955 Presence of coronary angioplasty implant and graft: Secondary | ICD-10-CM

## 2022-12-06 ENCOUNTER — Encounter (HOSPITAL_COMMUNITY)
Admission: RE | Admit: 2022-12-06 | Discharge: 2022-12-06 | Disposition: A | Discharge status: Home / Self Care | Payer: PRIVATE HEALTH INSURANCE | Source: Ambulatory Visit | Attending: Cardiology | Admitting: Cardiology

## 2022-12-06 DIAGNOSIS — Z955 Presence of coronary angioplasty implant and graft: Secondary | ICD-10-CM | POA: Diagnosis not present

## 2022-12-08 ENCOUNTER — Encounter (HOSPITAL_COMMUNITY)
Admission: RE | Admit: 2022-12-08 | Discharge: 2022-12-08 | Disposition: A | Discharge status: Home / Self Care | Payer: PRIVATE HEALTH INSURANCE | Source: Ambulatory Visit | Attending: Cardiology | Admitting: Cardiology

## 2022-12-08 DIAGNOSIS — Z955 Presence of coronary angioplasty implant and graft: Secondary | ICD-10-CM | POA: Diagnosis not present

## 2022-12-13 ENCOUNTER — Encounter (HOSPITAL_COMMUNITY)
Admission: RE | Admit: 2022-12-13 | Discharge: 2022-12-13 | Disposition: A | Discharge status: Home / Self Care | Payer: PRIVATE HEALTH INSURANCE | Source: Ambulatory Visit | Attending: Cardiology | Admitting: Cardiology

## 2022-12-13 DIAGNOSIS — Z955 Presence of coronary angioplasty implant and graft: Secondary | ICD-10-CM | POA: Diagnosis not present

## 2022-12-15 ENCOUNTER — Encounter (HOSPITAL_COMMUNITY)
Admission: RE | Admit: 2022-12-15 | Discharge: 2022-12-15 | Disposition: A | Discharge status: Home / Self Care | Payer: PRIVATE HEALTH INSURANCE | Source: Ambulatory Visit | Attending: Cardiology | Admitting: Cardiology

## 2022-12-15 DIAGNOSIS — Z955 Presence of coronary angioplasty implant and graft: Secondary | ICD-10-CM

## 2022-12-18 ENCOUNTER — Encounter (HOSPITAL_COMMUNITY): Payer: PRIVATE HEALTH INSURANCE

## 2022-12-20 ENCOUNTER — Encounter (HOSPITAL_COMMUNITY)
Admission: RE | Admit: 2022-12-20 | Discharge: 2022-12-20 | Disposition: A | Discharge status: Home / Self Care | Payer: PRIVATE HEALTH INSURANCE | Source: Ambulatory Visit | Attending: Cardiology | Admitting: Cardiology

## 2022-12-20 DIAGNOSIS — Z955 Presence of coronary angioplasty implant and graft: Secondary | ICD-10-CM | POA: Insufficient documentation

## 2022-12-20 NOTE — Progress Notes (Signed)
Cardiac Individual Treatment Plan  Patient Details  Name: Kathryn [name] MRN: [id number] Date of Birth: Feb 03, 1961 Referring Provider:   Flowsheet Row INTENSIVE CARDIAC REHAB ORIENT from 11/01/2022 in [facility]  Referring Provider Olga Millers, MD       Initial Encounter Date:  Flowsheet Row INTENSIVE CARDIAC REHAB ORIENT from 11/01/2022 in [facility] for Heart, Vascular, & Lung Health  Date 11/01/22       Visit Diagnosis: S/P drug eluting coronary stent placement  Patient's Home Medications on Admission:  Current Outpatient Medications:    ADDERALL XR 30 MG 24 hr capsule, Take 30 mg by mouth daily., Disp: , Rfl:    albuterol (VENTOLIN HFA) 108 (90 Base) MCG/ACT inhaler, Inhale 1 puff into the lungs every 6 (six) hours as needed for wheezing or shortness of breath., Disp: , Rfl:    ALPRAZolam (XANAX) 0.5 MG tablet, Take 0.5 mg by mouth at bedtime as needed for anxiety., Disp: , Rfl:    amLODipine (NORVASC) 10 MG tablet, Take 1 tablet (10 mg total) by mouth daily., Disp: 90 tablet, Rfl: 3   aspirin 81 MG tablet, Take 81 mg by mouth daily., Disp: , Rfl:    Brimonidine Tartrate (LUMIFY) 0.025 % SOLN, Place 1 drop into both eyes 4 (four) times daily as needed (dry eyes)., Disp: , Rfl:    buPROPion (WELLBUTRIN XL) 300 MG 24 hr tablet, Take 300 mg by mouth every morning., Disp: , Rfl:    cholecalciferol (VITAMIN D3) 25 MCG (1000 UNIT) tablet, Take 1,000 Units by mouth daily., Disp: , Rfl:    clobetasol (TEMOVATE) 0.05 % external solution, Apply 1 Application topically 2 (two) times daily as needed (irritation)., Disp: , Rfl:    clopidogrel (PLAVIX) 75 MG tablet, Take 1 tablet (75 mg total) by mouth daily., Disp: 90 tablet, Rfl: 3   cyanocobalamin (VITAMIN B12) 1000 MCG tablet, Take 3,000 mcg by mouth daily., Disp: , Rfl:    desvenlafaxine (PRISTIQ) 100 MG 24 hr tablet, Take 100 mg by mouth every morning.,  Disp: , Rfl:    levothyroxine (SYNTHROID) 112 MCG tablet, Take 112 mcg by mouth daily before breakfast., Disp: , Rfl:    losartan (COZAAR) 100 MG tablet, Take 100 mg by mouth daily., Disp: , Rfl:    MAGNESIUM GLYCINATE PO, Take 420 mg by mouth daily., Disp: , Rfl:    Melatonin 10 MG CHEW, Chew 10 mg by mouth at bedtime., Disp: , Rfl:    nitroGLYCERIN (NITROSTAT) 0.4 MG SL tablet, Place 1 tablet (0.4 mg total) under the tongue every 5 (five) minutes as needed., Disp: 25 tablet, Rfl: 2   Potassium Chloride ER 20 MEQ TBCR, TAKE 1 TABLET BY MOUTH TWO  TIMES DAILY, Disp: 30 tablet, Rfl: 0   rosuvastatin (CRESTOR) 40 MG tablet, TAKE 1 TABLET DAILY (SCHEDULE AN APPOINTMENT FOR FURTHER REFILLS), Disp: 90 tablet, Rfl: 3   triamterene-hydrochlorothiazide (MAXZIDE) 75-50 MG tablet, Take 1 tablet by mouth daily. Restart this after FU with PCP, Disp: , Rfl:    valACYclovir (VALTREX) 500 MG tablet, Take 500 mg by mouth daily as needed (for outbreaks). , Disp: , Rfl:   Past Medical History: Past Medical History:  Diagnosis Date   AKI (acute kidney injury) (HCC) 01/21/2019   Anxiety    GERD (gastroesophageal reflux disease)    Graves disease    with hyperthyroidism diagnosed at age 62 (treated with propylthiouracil for 2 years)  Hyperlipidemia    Hypertension    Hypothyroidism    Sleep disturbance     Tobacco Use: Social History   Tobacco Use  Smoking Status Never  Smokeless Tobacco Never    Labs: Review Flowsheet  More data may exist      Latest Ref Rng & Units 12/10/2013 01/21/2019 10/05/2020 06/30/2021 10/30/2022  Labs for ITP Cardiac and Pulmonary Rehab  Cholestrol 100 - 199 mg/dL - - 409  811  914   LDL (calc) 0 - 99 mg/dL - - 53  50  58   HDL-C >39 mg/dL - - 51  52  43   Trlycerides 0 - 149 mg/dL - - 59  82  98   Hemoglobin A1c 4.8 - 5.6 % 5.9  5.8  - - -    Capillary Blood Glucose: Lab Results  Component Value Date   GLUCAP 102 (H) 01/23/2019   GLUCAP 119 (H) 01/22/2019   GLUCAP  123 (H) 01/21/2019   GLUCAP 113 (H) 01/21/2019   GLUCAP 102 (H) 01/21/2019     Exercise Target Goals: Exercise Program Goal: Individual exercise prescription set using results from initial 6 min walk test and THRR while considering  patient's activity barriers and safety.   Exercise Prescription Goal: Initial exercise prescription builds to 30-45 minutes a day of aerobic activity, 2-3 days per week.  Home exercise guidelines will be given to patient during program as part of exercise prescription that the participant will acknowledge.  Activity Barriers & Risk Stratification:  Activity Barriers & Cardiac Risk Stratification - 11/01/22 1313       Activity Barriers & Cardiac Risk Stratification   Activity Barriers Neck/Spine Problems    Cardiac Risk Stratification High   <5 METs on            6 Minute Walk:  6 Minute Walk     Row Name 11/01/22 1312         6 Minute Walk   Phase Initial     Distance 1800 feet     Walk Time 6 minutes     # of Rest Breaks 0     MPH 3.41     METS 4.06     RPE 12     Perceived Dyspnea  0     VO2 Peak 14.23     Symptoms No     Resting HR 64 bpm     Resting BP 112/60     Resting Oxygen Saturation  97 %     Exercise Oxygen Saturation  during 6 min walk 98 %     Max Ex. HR 92 bpm     Max Ex. BP 132/68     2 Minute Post BP 120/66              Oxygen Initial Assessment:   Oxygen Re-Evaluation:   Oxygen Discharge (Final Oxygen Re-Evaluation):   Initial Exercise Prescription:  Initial Exercise Prescription - 11/01/22 1300       Date of Initial Exercise RX and Referring Provider   Date 11/01/22    Referring Provider Olga Millers, MD    Expected Discharge Date 12/15/22      Treadmill   MPH 3    Grade 0    Minutes 15    METs 3.5      Arm Ergometer   Level 1    Watts 20    RPM 60    Minutes 15    METs 2.5  Prescription Details   Frequency (times per week) 3    Duration Progress to 30 minutes of  continuous aerobic without signs/symptoms of physical distress      Intensity   THRR 40-80% of Max Heartrate 63-126    Ratings of Perceived Exertion 11-13    Perceived Dyspnea 0-4      Progression   Progression Continue progressive overload as per policy without signs/symptoms or physical distress.      Resistance Training   Training Prescription Yes    Weight 3    Reps 10-15             Perform Capillary Blood Glucose checks as needed.  Exercise Prescription Changes:   Exercise Prescription Changes     Row Name 11/06/22 1458 11/20/22 1630 11/27/22 1640 12/08/22 1630       Response to Exercise   Blood Pressure (Admit) 124/72 130/70 118/72 124/84    Blood Pressure (Exercise) 168/92 160/78 162/80 152/70    Blood Pressure (Exit) 132/72 114/60 120/70 120/70    Heart Rate (Admit) 70 bpm 90 bpm 83 bpm 80 bpm    Heart Rate (Exercise) 109 bpm 132 bpm 135 bpm 135 bpm    Heart Rate (Exit) 77 bpm 91 bpm 92 bpm 89 bpm    Rating of Perceived Exertion (Exercise) 13 11 11 11     Symptoms None None None None    Comments Off to a great start with exercise. Reviewed MET's Reviewed MET's, goals and home ExRx Reviewed MET's and goals    Duration Continue with 30 min of aerobic exercise without signs/symptoms of physical distress. Continue with 30 min of aerobic exercise without signs/symptoms of physical distress. Continue with 30 min of aerobic exercise without signs/symptoms of physical distress. Continue with 30 min of aerobic exercise without signs/symptoms of physical distress.    Intensity THRR unchanged THRR unchanged THRR unchanged THRR New  63-143      Progression   Progression Continue to progress workloads to maintain intensity without signs/symptoms of physical distress. Continue to progress workloads to maintain intensity without signs/symptoms of physical distress. Continue to progress workloads to maintain intensity without signs/symptoms of physical distress. Continue to  progress workloads to maintain intensity without signs/symptoms of physical distress.    Average METs 2.9 3.59 4.16 4.2      Resistance Training   Training Prescription Yes No Yes Yes    Weight 3 -- 3 lbs wts 3 lbs wts    Reps 10-15 -- 10-15 10-15    Time 10 Minutes -- 10 Minutes 10 Minutes      Interval Training   Interval Training No No No No      Treadmill   MPH 3 3.3 3.3 3.5    Grade 0 1 2 2.5    Minutes 15 15 15 15     METs 3.3 3.98 4.43 4.89      Arm Ergometer   Level 1 1.5 2 2.5    Watts 16 23 -- 37    RPM 69 84 -- 68    Minutes 15 15 15 15     METs 2.5 3.2 3.9 3.5      Home Exercise Plan   Plans to continue exercise at -- -- Home (comment) Home (comment)    Frequency -- -- Add 3 additional days to program exercise sessions. Add 3 additional days to program exercise sessions.    Initial Home Exercises Provided -- -- 11/27/22 11/27/22  Exercise Comments:   Exercise Comments     Row Name 11/06/22 1605 11/22/22 1338 11/27/22 1647 12/08/22 1633     Exercise Comments Patient tolerated 1st session of exercise well without symptoms. Reviewed MET's with pt today. Pt tolerated exercise well with an average MET level of 3.59. Pt is progressing well so far, will continue to monitor and progress as tolerated without sign or symptom Reviewed MET's, goals and home ExRx. pt tolerated exercise well with an average MEt level of 4.17. Pt had increase HR over THRR, will request INC THRR this week. Pt will continue to exercise by walking, hand weights and using resistance bands 3-4 days for 20-30 mins, with a push towards reaching 30 mins each session. Reviewed MET's with pt today. pt tolerated exercise well with an average MET level of 4.2. Pt increased workloads on both stations and is feeling good with the progress shes making. Will continue to monitor pt and progress workloads as tolreated             Exercise Goals and Review:   Exercise Goals     Row Name  11/01/22 1028             Exercise Goals   Increase Physical Activity Yes       Intervention Provide advice, education, support and counseling about physical activity/exercise needs.;Develop an individualized exercise prescription for aerobic and resistive training based on initial evaluation findings, risk stratification, comorbidities and participant's personal goals.       Expected Outcomes Short Term: Attend rehab on a regular basis to increase amount of physical activity.;Long Term: Exercising regularly at least 3-5 days a week.;Long Term: Add in home exercise to make exercise part of routine and to increase amount of physical activity.       Increase Strength and Stamina Yes       Intervention Develop an individualized exercise prescription for aerobic and resistive training based on initial evaluation findings, risk stratification, comorbidities and participant's personal goals.;Provide advice, education, support and counseling about physical activity/exercise needs.       Expected Outcomes Short Term: Increase workloads from initial exercise prescription for resistance, speed, and METs.;Short Term: Perform resistance training exercises routinely during rehab and add in resistance training at home;Long Term: Improve cardiorespiratory fitness, muscular endurance and strength as measured by increased METs and functional capacity ( )       Able to understand and use rate of perceived exertion (RPE) scale Yes       Intervention Provide education and explanation on how to use RPE scale       Expected Outcomes Short Term: Able to use RPE daily in rehab to express subjective intensity level;Long Term:  Able to use RPE to guide intensity level when exercising independently       Knowledge and understanding of Target Heart Rate Range (THRR) Yes       Intervention Provide education and explanation of THRR including how the numbers were predicted and where they are located for reference        Expected Outcomes Short Term: Able to state/look up THRR;Short Term: Able to use daily as guideline for intensity in rehab;Long Term: Able to use THRR to govern intensity when exercising independently       Understanding of Exercise Prescription Yes       Intervention Provide education, explanation, and written materials on patient's individual exercise prescription       Expected Outcomes Short Term: Able to explain program exercise prescription;Long Term:  Able to explain home exercise prescription to exercise independently                Exercise Goals Re-Evaluation :  Exercise Goals Re-Evaluation     Row Name 11/06/22 1605 11/27/22 1643           Exercise Goal Re-Evaluation   Exercise Goals Review Increase Physical Activity;Able to understand and use rate of perceived exertion (RPE) scale;Increase Strength and Stamina Increase Physical Activity;Able to understand and use rate of perceived exertion (RPE) scale;Increase Strength and Stamina;Understanding of Exercise Prescription;Knowledge and understanding of Target Heart Rate Range (THRR)      Comments Patient able to understand and use RPE scale appropriately. Reviewed MET's, goals and home ExRx. pt tolerated exercise well with an average MEt level of 4.17. Pt had increase HR over THRR, will request INC THRR this week. Pt will continue to exercise by walking, hand weights and using resistance bands 3-4 days for 20-30 mins, with a push towards reaching 30 mins each session.      Expected Outcomes Progress workloads as tolerated to help increase strength and stamina. Pt will continue to exercise on her own               Discharge Exercise Prescription (Final Exercise Prescription Changes):  Exercise Prescription Changes - 12/08/22 1630       Response to Exercise   Blood Pressure (Admit) 124/84    Blood Pressure (Exercise) 152/70    Blood Pressure (Exit) 120/70    Heart Rate (Admit) 80 bpm    Heart Rate (Exercise) 135 bpm     Heart Rate (Exit) 89 bpm    Rating of Perceived Exertion (Exercise) 11    Symptoms None    Comments Reviewed MET's and goals    Duration Continue with 30 min of aerobic exercise without signs/symptoms of physical distress.    Intensity THRR New   63-143     Progression   Progression Continue to progress workloads to maintain intensity without signs/symptoms of physical distress.    Average METs 4.2      Resistance Training   Training Prescription Yes    Weight 3 lbs wts    Reps 10-15    Time 10 Minutes      Interval Training   Interval Training No      Treadmill   MPH 3.5    Grade 2.5    Minutes 15    METs 4.89      Arm Ergometer   Level 2.5    Watts 37    RPM 68    Minutes 15    METs 3.5      Home Exercise Plan   Plans to continue exercise at Home (comment)    Frequency Add 3 additional days to program exercise sessions.    Initial Home Exercises Provided 11/27/22             Nutrition:  Target Goals: Understanding of nutrition guidelines, daily intake of sodium 1500mg , cholesterol 200mg , calories 30% from fat and 7% or less from saturated fats, daily to have 5 or more servings of fruits and vegetables.  Biometrics:  Pre Biometrics - 11/01/22 1026       Pre Biometrics   Waist Circumference 37.5 inches    Hip Circumference 41.5 inches    Waist to Hip Ratio 0.9 %    Triceps Skinfold 27 mm    % Body Fat 39.8 %    Grip Strength 28 kg  Flexibility 17.5 in    Single Leg Stand 22.56 seconds              Nutrition Therapy Plan and Nutrition Goals:  Nutrition Therapy & Goals - 12/08/22 1603       Nutrition Therapy   Diet Heart Healthy Diet    Drug/Food Interactions Statins/Certain Fruits      Personal Nutrition Goals   Nutrition Goal Patient to identify strategies for reducing cardiovascular risk by attending the [facility] education and nutrition series weekly.    Personal Goal #2 Patient to improve diet quality by using the plate method as a  guide for meal planning to include lean protein/plant protein, fruits, vegetables, whole grains, nonfat dairy as part of a well-balanced diet.    Comments Goals in progress. Janeece continues to attend the [facility] education and nutrition series regularly. She has maintained her weight since starting with our program. Patient will benefit from participation in intensive cardiac rehab for nutrition, exercise, and lifestyle modification.      Intervention Plan   Intervention Prescribe, educate and counsel regarding individualized specific dietary modifications aiming towards targeted core components such as weight, hypertension, lipid management, diabetes, heart failure and other comorbidities.;Nutrition handout(s) given to patient.    Expected Outcomes Short Term Goal: Understand basic principles of dietary content, such as calories, fat, sodium, cholesterol and nutrients.;Long Term Goal: Adherence to prescribed nutrition plan.             Nutrition Assessments:  Nutrition Assessments - 11/10/22 1000       Rate Your Plate Scores   Pre Score 39            MEDIFICTS Score Key: ?70 Need to make dietary changes  40-70 Heart Healthy Diet ? 40 Therapeutic Level Cholesterol Diet   Flowsheet Row INTENSIVE CARDIAC REHAB from 11/08/2022 in [facility] for Heart, Vascular, & Lung Health  Picture Your Plate Total Score on Admission 39      Picture Your Plate Scores: <14 Unhealthy dietary pattern with much room for improvement. 41-50 Dietary pattern unlikely to meet recommendations for good health and room for improvement. 51-60 More healthful dietary pattern, with some room for improvement.  >60 Healthy dietary pattern, although there may be some specific behaviors that could be improved.    Nutrition Goals Re-Evaluation:  Nutrition Goals Re-Evaluation     Row Name 11/06/22 1546 12/08/22 1603           Goals   Current Weight 164 lb 7.4 oz (74.6 kg) 164 lb  7.4 oz (74.6 kg)      Comment A1c 6.1, lipids WNL no new labs; most recent labs A1c 6.1, lipids WNL      Expected Outcome Latorie reports history of prediabetes and CKD3; she is followed by [facility]. She reports that she was taking metformin for prediabetes until 2020. She reports craving sweets and over eating convenience foods. Patient will benefit from participation in intensive cardiac rehab for nutrition, exercise, and lifestyle modification. Goals in progress. Merit continues to attend the [facility] education and nutrition series regularly. She has maintained her weight since starting with our program. Patient will benefit from participation in intensive cardiac rehab for nutrition, exercise, and lifestyle modification.               Nutrition Goals Re-Evaluation:  Nutrition Goals Re-Evaluation     Row Name 11/06/22 1546 12/08/22 1603           Goals  Current Weight 164 lb 7.4 oz (74.6 kg) 164 lb 7.4 oz (74.6 kg)      Comment A1c 6.1, lipids WNL no new labs; most recent labs A1c 6.1, lipids WNL      Expected Outcome Jermaine reports history of prediabetes and CKD3; she is followed by [facility]. She reports that she was taking metformin for prediabetes until 2020. She reports craving sweets and over eating convenience foods. Patient will benefit from participation in intensive cardiac rehab for nutrition, exercise, and lifestyle modification. Goals in progress. Esha continues to attend the [facility] education and nutrition series regularly. She has maintained her weight since starting with our program. Patient will benefit from participation in intensive cardiac rehab for nutrition, exercise, and lifestyle modification.               Nutrition Goals Discharge (Final Nutrition Goals Re-Evaluation):  Nutrition Goals Re-Evaluation - 12/08/22 1603       Goals   Current Weight 164 lb 7.4 oz (74.6 kg)    Comment no new labs; most recent labs A1c 6.1,  lipids WNL    Expected Outcome Goals in progress. Marilee continues to attend the [facility] education and nutrition series regularly. She has maintained her weight since starting with our program. Patient will benefit from participation in intensive cardiac rehab for nutrition, exercise, and lifestyle modification.             Psychosocial: Target Goals: Acknowledge presence or absence of significant depression and/or stress, maximize coping skills, provide positive support system. Participant is able to verbalize types and ability to use techniques and skills needed for reducing stress and depression.  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 She says that she has a hard time allowing others to do things for her and tends to take on more than she is capable of. This causes her to feel anxious at times and keeps her awake at night because she cannot turn off her thoughts to relax. She shared that at times she feels her heart is racing and has shared that with her cardiologist.      Family Dynamics   Good Support System? Yes   Dailin has her sisters and friends   Comments [place] shared that she has feelings of depression related to some recent life changes. She is actively going through a divorce, and her son and sister just moved out of her home. She said that while it is nice to not support them any longer, it is also a big change that she has struggled with. 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 She stated that she feels like her medications are no longer working for her as well as they used to and she is interested in going back to a Veterinary surgeon. She has tried asking her friends for places to go, but they were no longer taking patients. Chantay was visibly emotional, support and reassurance was offered. She was  encouraged to speak with her PCP about options for counseling as well as adjustments to her medications. [place] stated she would speak to her PCP.      Barriers   Psychosocial barriers to participate in program The patient should benefit from training in stress management and relaxation.      Screening Interventions   Interventions Encouraged to exercise;To provide support and resources with identified psychosocial needs;Provide feedback about the scores to participant    Expected Outcomes Short Term goal: Utilizing psychosocial counselor, staff and physician to assist with identification of specific Stressors or current issues interfering with healing process. Setting desired goal for each stressor or current issue identified.;Long Term Goal: Stressors or current issues are controlled or eliminated.;Short Term goal: Identification and review with participant of any Quality of Life or Depression concerns found by scoring the questionnaire.;Long Term goal: The participant improves quality of Life and PHQ9 Scores as seen by post scores and/or verbalization of changes             Quality of Life Scores:  Quality of Life - 11/01/22 1321       Quality of Life   Select Quality of Life      Quality of Life Scores   Health/Function Pre 18.4 %    Socioeconomic Pre 22.86 %    Psych/Spiritual Pre 21.79 %    Family Pre 23.63 %    GLOBAL Pre 20.7 %            Scores of 19 and below usually indicate a poorer quality of life in these areas.  A difference of  2-3 points is a clinically meaningful  difference.  A difference of 2-3 points in the total score of the Quality of Life Index has been associated with significant improvement in overall quality of life, self-image, physical symptoms, and general health in studies assessing change in quality of life.  PHQ-9: Review Flowsheet       11/01/2022  Depression screen PHQ 2/9  Decreased Interest 1  Down, Depressed, Hopeless 1  PHQ - 2 Score 2  Altered sleeping 3  Tired, decreased energy 2  Change in appetite 1  Feeling bad or failure about yourself  1  Trouble concentrating 3  Moving slowly or fidgety/restless 1  Suicidal thoughts 0  PHQ-9 Score 13  Difficult doing work/chores Somewhat difficult   Interpretation of Total Score  Total Score Depression Severity:  1-4 = Minimal depression, 5-9 = Mild depression, 10-14 = Moderate depression, 15-19 = Moderately severe depression, 20-27 = Severe depression   Psychosocial Evaluation and Intervention:   Psychosocial Re-Evaluation:  Psychosocial Re-Evaluation     Row Name 11/07/22 0815 11/22/22 1726 12/20/22 1513         Psychosocial Re-Evaluation   Current issues with Current Depression;Current Stress Concerns;Current Anxiety/Panic;Current Psychotropic Meds;Current Sleep Concerns Current Depression;Current Stress Concerns;Current Anxiety/Panic;Current Psychotropic Meds;Current Sleep Concerns Current Depression;Current Stress Concerns;Current Anxiety/Panic;Current Psychotropic Meds;Current Sleep Concerns     Comments Will discuss Terrah's QOL and PHQ2-9 in the upcoming week. Reviewed quality of life. Samauri plans to dicuss counselling with her PCP she has an upcoming appointment. Avon has not voiced any increased concerns or stressors at intensive cardiac rehab     Expected Outcomes Aamari will have controlled or decreased depression/ stressors upon completion of intensive cardiac rehab Adalen will have controlled or decreased depression/ stressors upon completion of intensive cardiac  rehab Brynleigh will have controlled or decreased depression/ stressors upon completion of intensive cardiac rehab  Interventions Stress management education;Encouraged to attend Cardiac Rehabilitation for the exercise;Relaxation education Stress management education;Encouraged to attend Cardiac Rehabilitation for the exercise;Relaxation education Stress management education;Encouraged to attend Cardiac Rehabilitation for the exercise;Relaxation education     Continue Psychosocial Services  Follow up required by staff Follow up required by staff No Follow up required       Initial Review   Source of Stress Concerns Family;Occupation Family;Occupation Family;Occupation     Comments Will continue to monitor and offer support as needed Will continue to monitor and offer support as needed Will continue to monitor and offer support as needed              Psychosocial Discharge (Final Psychosocial Re-Evaluation):  Psychosocial Re-Evaluation - 12/20/22 1513       Psychosocial Re-Evaluation   Current issues with Current Depression;Current Stress Concerns;Current Anxiety/Panic;Current Psychotropic Meds;Current Sleep Concerns    Comments Carrie-Ann has not voiced any increased concerns or stressors at intensive cardiac rehab    Expected Outcomes Harold will have controlled or decreased depression/ stressors upon completion of intensive cardiac rehab    Interventions Stress management education;Encouraged to attend Cardiac Rehabilitation for the exercise;Relaxation education    Continue Psychosocial Services  No Follow up required      Initial Review   Source of Stress Concerns Family;Occupation    Comments Will continue to monitor and offer support as needed             Vocational Rehabilitation: Provide vocational rehab assistance to qualifying candidates.   Vocational Rehab Evaluation & Intervention:  Vocational Rehab - 11/01/22 1322       Initial Vocational Rehab Evaluation & Intervention    Assessment shows need for Vocational Rehabilitation No   Catina is currently working            Education: Education Goals: Education classes will be provided on a weekly basis, covering required topics. Participant will state understanding/return demonstration of topics presented.    Education     Row Name 11/06/22 1400     Education   Cardiac Education Topics Pritikin   Psychologist, forensic Exercise Education   Exercise Education Biomechanial Limitations   Instruction Review Code 1- Verbalizes Understanding   Class Start Time 1407   Class Stop Time 1444   Class Time Calculation (min) 37 min    Row Name 11/08/22 1600     Education   Cardiac Education Topics Pritikin   Customer service manager   Weekly Topic Comforting Weekend Breakfasts   Instruction Review Code 1- Verbalizes Understanding   Class Start Time 1355   Class Stop Time 1442   Class Time Calculation (min) 47 min    Row Name 11/10/22 1500     Education   Cardiac Education Topics Pritikin   Licensed conveyancer Nutrition   Nutrition Dining Out - Part 1   Instruction Review Code 1- Verbalizes Understanding   Class Start Time 1400   Class Stop Time 1445   Class Time Calculation (min) 45 min    Row Name 11/13/22 1400     Education   Cardiac Education Topics Pritikin   Nurse, children's   Educator Dietitian   Select Nutrition   Nutrition Facts on Fat  Instruction Review Code 1- Verbalizes Understanding   Class Start Time 1400   Class Stop Time 1439   Class Time Calculation (min) 39 min     Cooking Scientist, research (physical sciences)    Row Name 11/15/22 1500     Education   Cardiac Education Topics Pritikin   Customer service manager   Weekly Topic Fast Evening Meals   Instruction Review Code 1-  Verbalizes Understanding   Class Start Time 1400   Class Stop Time 1440   Class Time Calculation (min) 40 min    Row Name 11/17/22 1600     Education   Cardiac Education Topics Pritikin   Licensed conveyancer Nutrition   Nutrition Vitamins and Minerals   Instruction Review Code 1- Verbalizes Understanding   Class Start Time 1400   Class Stop Time 1440   Class Time Calculation (min) 40 min    Row Name 11/20/22 1700     Education   Cardiac Education Topics Pritikin   Geographical information systems officer Psychosocial   Psychosocial Workshop Healthy Sleep for a Healthy Heart   Instruction Review Code 1- Verbalizes Understanding   Class Start Time 1405   Class Stop Time 1502   Class Time Calculation (min) 57 min    Row Name 11/22/22 1500     Education   Cardiac Education Topics Pritikin   Customer service manager   Weekly Topic International Cuisine- Spotlight on the [employer] Zones   Instruction Review Code 1- Verbalizes Understanding   Class Start Time 1355   Class Stop Time 1430   Class Time Calculation (min) 35 min    Row Name 11/24/22 1400     Education   Cardiac Education Topics Pritikin   Psychologist, forensic Exercise Education   Exercise Education Improving Performance   Instruction Review Code 1- Verbalizes Understanding   Class Start Time 1400   Class Stop Time 1432   Class Time Calculation (min) 32 min    Row Name 11/27/22 1600     Education   Cardiac Education Topics Pritikin   Glass blower/designer Nutrition   Nutrition Workshop Fueling a Forensic psychologist   Instruction Review Code 1- Tax inspector   Class Start Time 1400   Class Stop Time 1453   Class Time Calculation (min) 53 min    Row Name 12/01/22 1500      Education   Cardiac Education Topics Pritikin   Nurse, children's Exercise Physiologist   Select Psychosocial   Psychosocial How Our Thoughts Can Heal Our Hearts   Instruction Review Code 1- Verbalizes Understanding   Class Start Time 1400   Class Stop Time 1445   Class Time Calculation (min) 45 min    Row Name 12/04/22 1600     Education   Cardiac Education Topics Pritikin   Geographical information systems officer Psychosocial   Psychosocial Workshop From Head to Heart: The Power of a Healthy Outlook   Instruction Review Code 1- [employer]  Understanding   Class Start Time 1405   Class Stop Time 1502   Class Time Calculation (min) 57 min    Row Name 12/06/22 1500     Education   Cardiac Education Topics Pritikin   Customer service manager   Weekly Topic Powerhouse Plant-Based Proteins   Instruction Review Code 1- Verbalizes Understanding   Class Start Time 1355   Class Stop Time 1436   Class Time Calculation (min) 41 min    Row Name 12/08/22 1400     Education   Cardiac Education Topics Pritikin   Hospital doctor Education   General Education Hypertension and Heart Disease   Instruction Review Code 1- Verbalizes Understanding   Class Start Time 1400   Class Stop Time 1442   Class Time Calculation (min) 42 min    Row Name 12/13/22 1500     Education   Cardiac Education Topics Pritikin   Customer service manager   Weekly Topic Adding Flavor - Sodium-Free   Instruction Review Code 1- Verbalizes Understanding   Class Start Time 1400   Class Stop Time 1445   Class Time Calculation (min) 45 min    Row Name 12/15/22 1600     Education   Cardiac Education Topics Pritikin   Engineer, mining  Education   General Education Heart Disease Risk Reduction   Instruction Review Code 1- Verbalizes Understanding   Class Start Time 1405   Class Stop Time 1450   Class Time Calculation (min) 45 min            Core Videos: Exercise    Move It!  Clinical staff conducted group or individual video education with verbal and written material and guidebook.  Patient learns the recommended Pritikin exercise program. Exercise with the goal of living a long, healthy life. Some of the health benefits of exercise include controlled diabetes, healthier blood pressure levels, improved cholesterol levels, improved heart and lung capacity, improved sleep, and better body composition. Everyone should speak with their doctor before starting or changing an exercise routine.  Biomechanical Limitations Clinical staff conducted group or individual video education with verbal and written material and guidebook.  Patient learns how biomechanical limitations can impact exercise and how we can mitigate and possibly overcome limitations to have an impactful and balanced exercise routine.  Body Composition Clinical staff conducted group or individual video education with verbal and written material and guidebook.  Patient learns that body composition (ratio of muscle mass to fat mass) is a key component to assessing overall fitness, rather than body weight alone. Increased fat mass, especially visceral belly fat, can put [place] at increased risk for metabolic syndrome, type 2 diabetes, heart disease, and even death. It is recommended to combine diet and exercise (cardiovascular and resistance training) to improve your body composition. Seek guidance from your physician and exercise physiologist before implementing an exercise routine.  Exercise Action Plan Clinical staff conducted group or individual video education with verbal and written material and guidebook.  Patient learns the recommended strategies to achieve  and enjoy long-term exercise adherence, including variety, self-motivation, self-efficacy, and positive decision making. Benefits of exercise include fitness, good health, weight management, more energy, better sleep, less stress,  and overall well-being.  Medical   Heart Disease Risk Reduction Clinical staff conducted group or individual video education with verbal and written material and guidebook.  Patient learns our heart is our most vital organ as it circulates oxygen, nutrients, white blood cells, and hormones throughout the entire body, and carries waste away. Data supports a plant-based eating plan like the Pritikin Program for its effectiveness in slowing progression of and reversing heart disease. The video provides a number of recommendations to address heart disease.   Metabolic Syndrome and Belly Fat  Clinical staff conducted group or individual video education with verbal and written material and guidebook.  Patient learns what metabolic syndrome is, how it leads to heart disease, and how one can reverse it and keep it from coming back. You have metabolic syndrome if you have 3 of the following 5 criteria: abdominal obesity, high blood pressure, high triglycerides, low HDL cholesterol, and high blood sugar.  Hypertension and Heart Disease Clinical staff conducted group or individual video education with verbal and written material and guidebook.  Patient learns that high blood pressure, or hypertension, is very common in the [place]. Hypertension is largely due to excessive salt intake, but other important risk factors include being overweight, physical inactivity, drinking too much alcohol, smoking, and not eating enough potassium from fruits and vegetables. High blood pressure is a leading risk factor for heart attack, stroke, congestive heart failure, dementia, kidney failure, and premature death. Long-term effects of excessive salt intake include stiffening of the arteries and  thickening of heart muscle and organ damage. Recommendations include ways to reduce hypertension and the risk of heart disease.  Diseases of Our Time - Focusing on Diabetes Clinical staff conducted group or individual video education with verbal and written material and guidebook.  Patient learns why the best way to stop diseases of our time is prevention, through food and other lifestyle changes. Medicine (such as prescription pills and surgeries) is often only a Band-Aid on the problem, not a long-term solution. Most common diseases of our time include obesity, type 2 diabetes, hypertension, heart disease, and cancer. The Pritikin Program is recommended and has been proven to help reduce, reverse, and/or prevent the damaging effects of metabolic syndrome.  Nutrition   Overview of the Pritikin Eating Plan  Clinical staff conducted group or individual video education with verbal and written material and guidebook.  Patient learns about the Pritikin Eating Plan for disease risk reduction. The Pritikin Eating Plan emphasizes a wide variety of unrefined, minimally-processed carbohydrates, like fruits, vegetables, whole grains, and legumes. Go, Caution, and Stop food choices are explained. Plant-based and lean animal proteins are emphasized. Rationale provided for low sodium intake for blood pressure control, low added sugars for blood sugar stabilization, and low added fats and oils for coronary artery disease risk reduction and weight management.  Calorie Density  Clinical staff conducted group or individual video education with verbal and written material and guidebook.  Patient learns about calorie density and how it impacts the Pritikin Eating Plan. Knowing the characteristics of the food you choose will help you decide whether those foods will lead to weight gain or weight loss, and whether you want to consume more or less of them. Weight loss is usually a side effect of the Pritikin Eating Plan  because of its focus on low calorie-dense foods.  Label Reading  Clinical staff conducted group or individual video education with verbal and written material and guidebook.  Patient learns about the Pritikin  recommended label reading guidelines and corresponding recommendations regarding calorie density, added sugars, sodium content, and whole grains.  Dining Out - Part 1  Clinical staff conducted group or individual video education with verbal and written material and guidebook.  Patient learns that restaurant meals can be sabotaging because they can be so high in calories, fat, sodium, and/or sugar. Patient learns recommended strategies on how to positively address this and avoid unhealthy pitfalls.  Facts on Fats  Clinical staff conducted group or individual video education with verbal and written material and guidebook.  Patient learns that lifestyle modifications can be just as effective, if not more so, as many medications for lowering your risk of heart disease. A Pritikin lifestyle can help to reduce your risk of inflammation and atherosclerosis (cholesterol build-up, or plaque, in the artery walls). Lifestyle interventions such as dietary choices and physical activity address the cause of atherosclerosis. A review of the types of fats and their impact on blood cholesterol levels, along with dietary recommendations to reduce fat intake is also included.  Nutrition Action Plan  Clinical staff conducted group or individual video education with verbal and written material and guidebook.  Patient learns how to incorporate Pritikin recommendations into their lifestyle. Recommendations include planning and keeping personal health goals in mind as an important part of their success.  Healthy Mind-Set    Healthy Minds, Bodies, Hearts  Clinical staff conducted group or individual video education with verbal and written material and guidebook.  Patient learns how to identify when they are  stressed. Video will discuss the impact of that stress, as well as the many benefits of stress management. Patient will also be introduced to stress management techniques. The way we think, act, and feel has an impact on our hearts.  How Our Thoughts Can Heal Our Hearts  Clinical staff conducted group or individual video education with verbal and written material and guidebook.  Patient learns that negative thoughts can cause depression and anxiety. This can result in negative lifestyle behavior and serious health problems. Cognitive behavioral therapy is an effective method to help control our thoughts in order to change and improve our emotional outlook.  Additional Videos:  Exercise    Improving Performance  Clinical staff conducted group or individual video education with verbal and written material and guidebook.  Patient learns to use a non-linear approach by alternating intensity levels and lengths of time spent exercising to help burn more calories and lose more body fat. Cardiovascular exercise helps improve heart health, metabolism, hormonal balance, blood sugar control, and recovery from fatigue. Resistance training improves strength, endurance, balance, coordination, reaction time, metabolism, and muscle mass. Flexibility exercise improves circulation, posture, and balance. Seek guidance from your physician and exercise physiologist before implementing an exercise routine and learn your capabilities and proper form for all exercise.  Introduction to Yoga  Clinical staff conducted group or individual video education with verbal and written material and guidebook.  Patient learns about yoga, a discipline of the coming together of mind, breath, and body. The benefits of yoga include improved flexibility, improved range of motion, better posture and core strength, increased lung function, weight loss, and positive self-image. Yoga's heart health benefits include lowered blood pressure,  healthier heart rate, decreased cholesterol and triglyceride levels, improved immune function, and reduced stress. Seek guidance from your physician and exercise physiologist before implementing an exercise routine and learn your capabilities and proper form for all exercise.  Medical   Aging: Manufacturing systems engineer of Life  Clinical staff conducted group or individual video education with verbal and written material and guidebook.  Patient learns key strategies and recommendations to stay in good physical health and enhance quality of life, such as prevention strategies, having an advocate, securing a Health Care Proxy and Power of Attorney, and keeping a list of medications and system for tracking them. It also discusses how to avoid risk for bone loss.  Biology of Weight Control  Clinical staff conducted group or individual video education with verbal and written material and guidebook.  Patient learns that weight gain occurs because we consume more calories than we burn (eating more, moving less). Even if your body weight is normal, you may have higher ratios of fat compared to muscle mass. Too much body fat puts you at increased risk for cardiovascular disease, heart attack, stroke, type 2 diabetes, and obesity-related cancers. In addition to exercise, following the Pritikin Eating Plan can help reduce your risk.  Decoding Lab Results  Clinical staff conducted group or individual video education with verbal and written material and guidebook.  Patient learns that lab test reflects one measurement whose values change over time and are influenced by many factors, including medication, stress, sleep, exercise, food, hydration, pre-existing medical conditions, and more. It is recommended to use the knowledge from this video to become more involved with your lab results and evaluate your numbers to speak with your doctor.   Diseases of Our Time - Overview  Clinical staff conducted group or  individual video education with verbal and written material and guidebook.  Patient learns that according to the CDC, 50% to 70% of chronic diseases (such as obesity, type 2 diabetes, elevated lipids, hypertension, and heart disease) are avoidable through lifestyle improvements including healthier food choices, listening to satiety cues, and increased physical activity.  Sleep Disorders Clinical staff conducted group or individual video education with verbal and written material and guidebook.  Patient learns how good quality and duration of sleep are important to overall health and well-being. Patient also learns about sleep disorders and how they impact health along with recommendations to address them, including discussing with a physician.  Nutrition  Dining Out - Part 2 Clinical staff conducted group or individual video education with verbal and written material and guidebook.  Patient learns how to plan ahead and communicate in order to maximize their dining experience in a healthy and nutritious manner. Included are recommended food choices based on the type of restaurant the patient is visiting.   Fueling a Banker conducted group or individual video education with verbal and written material and guidebook.  There is a strong connection between our food choices and our health. Diseases like obesity and type 2 diabetes are very prevalent and are in large-part due to lifestyle choices. The Pritikin Eating Plan provides plenty of food and hunger-curbing satisfaction. It is easy to follow, affordable, and helps reduce health risks.  Menu Workshop  Clinical staff conducted group or individual video education with verbal and written material and guidebook.  Patient learns that restaurant meals can sabotage health goals because they are often packed with calories, fat, sodium, and sugar. Recommendations include strategies to plan ahead and to communicate with the manager,  chef, or server to help order a healthier meal.  Planning Your Eating Strategy  Clinical staff conducted group or individual video education with verbal and written material and guidebook.  Patient learns about the Pritikin Eating Plan and its benefit of reducing the risk  of disease. The Pritikin Eating Plan does not focus on calories. Instead, it emphasizes high-quality, nutrient-rich foods. By knowing the characteristics of the foods, we choose, we can determine their calorie density and make informed decisions.  Targeting Your Nutrition Priorities  Clinical staff conducted group or individual video education with verbal and written material and guidebook.  Patient learns that lifestyle habits have a tremendous impact on disease risk and progression. This video provides eating and physical activity recommendations based on your personal health goals, such as reducing LDL cholesterol, losing weight, preventing or controlling type 2 diabetes, and reducing high blood pressure.  Vitamins and Minerals  Clinical staff conducted group or individual video education with verbal and written material and guidebook.  Patient learns different ways to obtain key vitamins and minerals, including through a recommended healthy diet. It is important to discuss all supplements you take with your doctor.   Healthy Mind-Set    Smoking Cessation  Clinical staff conducted group or individual video education with verbal and written material and guidebook.  Patient learns that cigarette smoking and tobacco addiction pose a serious health risk which affects millions of people. Stopping smoking will significantly reduce the risk of heart disease, lung disease, and many forms of cancer. Recommended strategies for quitting are covered, including working with your doctor to develop a successful plan.  Culinary   Becoming a Set designer conducted group or individual video education with verbal and written  material and guidebook.  Patient learns that cooking at home can be healthy, cost-effective, quick, and puts them in control. Keys to cooking healthy recipes will include looking at your recipe, assessing your equipment needs, planning ahead, making it simple, choosing cost-effective seasonal ingredients, and limiting the use of added fats, salts, and sugars.  Cooking - Breakfast and Snacks  Clinical staff conducted group or individual video education with verbal and written material and guidebook.  Patient learns how important breakfast is to satiety and nutrition through the entire day. Recommendations include key foods to eat during breakfast to help stabilize blood sugar levels and to prevent overeating at meals later in the day. Planning ahead is also a key component.  Cooking - Educational psychologist conducted group or individual video education with verbal and written material and guidebook.  Patient learns eating strategies to improve overall health, including an approach to cook more at home. Recommendations include thinking of animal protein as a side on your plate rather than center stage and focusing instead on lower calorie dense options like vegetables, fruits, whole grains, and plant-based proteins, such as beans. Making sauces in large quantities to freeze for later and leaving the skin on your vegetables are also recommended to maximize your experience.  Cooking - Healthy Salads and Dressing Clinical staff conducted group or individual video education with verbal and written material and guidebook.  Patient learns that vegetables, fruits, whole grains, and legumes are the foundations of the Pritikin Eating Plan. Recommendations include how to incorporate each of these in flavorful and healthy salads, and how to create homemade salad dressings. Proper handling of ingredients is also covered. Cooking - Soups and [employer] - Soups and Desserts Clinical staff conducted  group or individual video education with verbal and written material and guidebook.  Patient learns that Pritikin soups and desserts make for easy, nutritious, and delicious snacks and meal components that are low in sodium, fat, sugar, and calorie density, while high in vitamins, minerals, and filling  fiber. Recommendations include simple and healthy ideas for soups and desserts.   Overview     The Pritikin Solution Program Overview Clinical staff conducted group or individual video education with verbal and written material and guidebook.  Patient learns that the results of the Pritikin Program have been documented in more than 100 articles published in peer-reviewed journals, and the benefits include reducing risk factors for (and, in some cases, even reversing) high cholesterol, high blood pressure, type 2 diabetes, obesity, and more! An overview of the three key pillars of the Pritikin Program will be covered: eating well, doing regular exercise, and having a healthy mind-set.  WORKSHOPS  Exercise: Exercise Basics: Building Your Action Plan Clinical staff led group instruction and group discussion with PowerPoint presentation and patient guidebook. To enhance the learning environment the use of posters, models and videos may be added. At the conclusion of this workshop, patients will comprehend the difference between physical activity and exercise, as well as the benefits of incorporating both, into their routine. Patients will understand the FITT (Frequency, Intensity, Time, and Type) principle and how to use it to build an exercise action plan. In addition, safety concerns and other considerations for exercise and cardiac rehab will be addressed by the presenter. The purpose of this lesson is to promote a comprehensive and effective weekly exercise routine in order to improve patients' overall level of fitness.   Managing Heart Disease: Your Path to a Healthier Heart Clinical staff led  group instruction and group discussion with PowerPoint presentation and patient guidebook. To enhance the learning environment the use of posters, models and videos may be added.At the conclusion of this workshop, patients will understand the anatomy and physiology of the heart. Additionally, they will understand how Pritikin's three pillars impact the risk factors, the progression, and the management of heart disease.  The purpose of this lesson is to provide a high-level overview of the heart, heart disease, and how the Pritikin lifestyle positively impacts risk factors.  Exercise Biomechanics Clinical staff led group instruction and group discussion with PowerPoint presentation and patient guidebook. To enhance the learning environment the use of posters, models and videos may be added. Patients will learn how the structural parts of their bodies function and how these functions impact their daily activities, movement, and exercise. Patients will learn how to promote a neutral spine, learn how to manage pain, and identify ways to improve their physical movement in order to promote healthy living. The purpose of this lesson is to expose patients to common physical limitations that impact physical activity. Participants will learn practical ways to adapt and manage aches and pains, and to minimize their effect on regular exercise. Patients will learn how to maintain good posture while sitting, walking, and lifting.  Balance Training and Fall Prevention  Clinical staff led group instruction and group discussion with PowerPoint presentation and patient guidebook. To enhance the learning environment the use of posters, models and videos may be added. At the conclusion of this workshop, patients will understand the importance of their sensorimotor skills (vision, proprioception, and the vestibular system) in maintaining their ability to balance as they age. Patients will apply a variety of balancing  exercises that are appropriate for their current level of function. Patients will understand the common causes for poor balance, possible solutions to these problems, and ways to modify their physical environment in order to minimize their fall risk. The purpose of this lesson is to teach patients about the importance of maintaining balance  as they age and ways to minimize their risk of falling.  WORKSHOPS   Nutrition:  Fueling a Ship broker led group instruction and group discussion with PowerPoint presentation and patient guidebook. To enhance the learning environment the use of posters, models and videos may be added. Patients will review the foundational principles of the Pritikin Eating Plan and understand what constitutes a serving size in each of the food groups. Patients will also learn Pritikin-friendly foods that are better choices when away from home and review make-ahead meal and snack options. Calorie density will be reviewed and applied to three nutrition priorities: weight maintenance, weight loss, and weight gain. The purpose of this lesson is to reinforce (in a group setting) the key concepts around what patients are recommended to eat and how to apply these guidelines when away from home by planning and selecting Pritikin-friendly options. Patients will understand how calorie density may be adjusted for different weight management goals.  Mindful Eating  Clinical staff led group instruction and group discussion with PowerPoint presentation and patient guidebook. To enhance the learning environment the use of posters, models and videos may be added. Patients will briefly review the concepts of the Pritikin Eating Plan and the importance of low-calorie dense foods. The concept of mindful eating will be introduced as well as the importance of paying attention to internal hunger signals. Triggers for non-hunger eating and techniques for dealing with triggers will be explored.  The purpose of this lesson is to provide patients with the opportunity to review the basic principles of the Pritikin Eating Plan, discuss the value of eating mindfully and how to measure internal cues of hunger and fullness using the Hunger Scale. Patients will also discuss reasons for non-hunger eating and learn strategies to use for controlling emotional eating.  Targeting Your Nutrition Priorities Clinical staff led group instruction and group discussion with PowerPoint presentation and patient guidebook. To enhance the learning environment the use of posters, models and videos may be added. Patients will learn how to determine their genetic susceptibility to disease by reviewing their family history. Patients will gain insight into the importance of diet as part of an overall healthy lifestyle in mitigating the impact of genetics and other environmental insults. The purpose of this lesson is to provide patients with the opportunity to assess their personal nutrition priorities by looking at their family history, their own health history and current risk factors. Patients will also be able to discuss ways of prioritizing and modifying the Pritikin Eating Plan for their highest risk areas  Menu  Clinical staff led group instruction and group discussion with PowerPoint presentation and patient guidebook. To enhance the learning environment the use of posters, models and videos may be added. Using menus brought in from [employer], or printed from [employer], patients will apply the Pritikin dining out guidelines that were presented in the [employer] video. Patients will also be able to practice these guidelines in a variety of provided scenarios. The purpose of this lesson is to provide patients with the opportunity to practice hands-on learning of the Pritikin Dining Out guidelines with actual menus and practice scenarios.  Label Reading Clinical staff led group instruction  and group discussion with PowerPoint presentation and patient guidebook. To enhance the learning environment the use of posters, models and videos may be added. Patients will review and discuss the Pritikin label reading guidelines presented in Pritikin's Label Reading Educational series video. Using fool labels brought in from  local grocery stores and markets, patients will apply the label reading guidelines and determine if the packaged food meet the Pritikin guidelines. The purpose of this lesson is to provide patients with the opportunity to review, discuss, and practice hands-on learning of the Pritikin Label Reading guidelines with actual packaged food labels. Cooking School  Pritikin's [employer] are designed to teach patients ways to prepare quick, simple, and affordable recipes at home. The importance of nutrition's role in chronic disease risk reduction is reflected in its emphasis in the overall Pritikin program. By learning how to prepare essential core Pritikin Eating Plan recipes, patients will increase control over what they eat; be able to customize the flavor of foods without the use of added salt, sugar, or fat; and improve the quality of the food they consume. By learning a set of core recipes which are easily assembled, quickly prepared, and affordable, patients are more likely to prepare more healthy foods at home. These workshops focus on convenient breakfasts, simple entres, side dishes, and desserts which can be prepared with minimal effort and are consistent with nutrition recommendations for cardiovascular risk reduction. Cooking [employer] are taught by a Armed forces logistics/support/administrative officer (RD) who has been trained by the [employer]. The chef or RD has a clear understanding of the importance of minimizing - if not completely eliminating - added fat, sugar, and sodium in recipes. Throughout the series of Cooking School Workshop sessions, patients will learn  about healthy ingredients and efficient methods of cooking to build confidence in their capability to prepare    Cooking School weekly topics:  Adding Flavor- Sodium-Free  Fast and Healthy Breakfasts  Powerhouse Plant-Based Proteins  Satisfying Salads and Dressings  Simple Sides and Sauces  International Cuisine-Spotlight on the [employer] Zones  Delicious Desserts  Savory Soups  [employer] - Meals in a Astronomer Appetizers and Snacks  Comforting Weekend Breakfasts  One-Pot Wonders   Fast Evening Meals  Landscape architect Your Pritikin Plate  WORKSHOPS   Healthy Mindset (Psychosocial):  Focused Goals, Sustainable Changes Clinical staff led group instruction and group discussion with PowerPoint presentation and patient guidebook. To enhance the learning environment the use of posters, models and videos may be added. Patients will be able to apply effective goal setting strategies to establish at least one personal goal, and then take consistent, meaningful action toward that goal. They will learn to identify common barriers to achieving personal goals and develop strategies to overcome them. Patients will also gain an understanding of how our mind-set can impact our ability to achieve goals and the importance of cultivating a positive and growth-oriented mind-set. The purpose of this lesson is to provide patients with a deeper understanding of how to set and achieve personal goals, as well as the tools and strategies needed to overcome common obstacles which may arise along the way.  From Head to Heart: The Power of a Healthy Outlook  Clinical staff led group instruction and group discussion with PowerPoint presentation and patient guidebook. To enhance the learning environment the use of posters, models and videos may be added. Patients will be able to recognize and describe the impact of emotions and mood on physical health. They will discover the importance of  self-care and explore self-care practices which may work for them. Patients will also learn how to utilize the 4 C's to cultivate a healthier outlook and better manage stress and challenges. The purpose of this lesson is to demonstrate  to patients how a healthy outlook is an essential part of maintaining good health, especially as they continue their cardiac rehab journey.  Healthy Sleep for a Healthy Heart Clinical staff led group instruction and group discussion with PowerPoint presentation and patient guidebook. To enhance the learning environment the use of posters, models and videos may be added. At the conclusion of this workshop, patients will be able to demonstrate knowledge of the importance of sleep to overall health, well-being, and quality of life. They will understand the symptoms of, and treatments for, common sleep disorders. Patients will also be able to identify daytime and nighttime behaviors which impact sleep, and they will be able to apply these tools to help manage sleep-related challenges. The purpose of this lesson is to provide patients with a general overview of sleep and outline the importance of quality sleep. Patients will learn about a few of the most common sleep disorders. Patients will also be introduced to the concept of "sleep hygiene," and discover ways to self-manage certain sleeping problems through simple daily behavior changes. Finally, the workshop will motivate patients by clarifying the links between quality sleep and their goals of heart-healthy living.   Recognizing and Reducing Stress Clinical staff led group instruction and group discussion with PowerPoint presentation and patient guidebook. To enhance the learning environment the use of posters, models and videos may be added. At the conclusion of this workshop, patients will be able to understand the types of stress reactions, differentiate between acute and chronic stress, and recognize the impact that chronic  stress has on their health. They will also be able to apply different coping mechanisms, such as reframing negative self-talk. Patients will have the opportunity to practice a variety of stress management techniques, such as deep abdominal breathing, progressive muscle relaxation, and/or guided imagery.  The purpose of this lesson is to educate patients on the role of stress in their lives and to provide healthy techniques for coping with it.  Learning Barriers/Preferences:  Learning Barriers/Preferences - 11/01/22 1322       Learning Barriers/Preferences   Learning Barriers Sight   glasses   Learning Preferences Computer/Internet;Group Instruction;Individual Instruction;Pictoral;Skilled Demonstration;Verbal Instruction;Video;Written Material             Education Topics:  Knowledge Questionnaire Score:  Knowledge Questionnaire Score - 11/01/22 1028       Knowledge Questionnaire Score   Pre Score 21/24             Core Components/Risk Factors/Patient Goals at Admission:  Personal Goals and Risk Factors at Admission - 11/01/22 1322       Core Components/Risk Factors/Patient Goals on Admission   Hypertension Yes    Intervention Provide education on lifestyle modifcations including regular physical activity/exercise, weight management, moderate sodium restriction and increased consumption of fresh fruit, vegetables, and low fat dairy, alcohol moderation, and smoking cessation.;Monitor prescription use compliance.    Expected Outcomes Short Term: Continued assessment and intervention until BP is < 140/69mm HG in hypertensive participants. < 130/6mm HG in hypertensive participants with diabetes, heart failure or chronic kidney disease.;Long Term: Maintenance of blood pressure at goal levels.    Lipids Yes    Intervention Provide education and support for participant on nutrition & aerobic/resistive exercise along with prescribed medications to achieve LDL 70mg , HDL >40mg .     Expected Outcomes Short Term: Participant states understanding of desired cholesterol values and is compliant with medications prescribed. Participant is following exercise prescription and nutrition guidelines.;Long Term: Cholesterol controlled with medications as  prescribed, with individualized exercise RX and with personalized nutrition plan. Value goals: LDL < 70mg , HDL > 40 mg.    Stress Yes    Intervention Offer individual and/or small group education and counseling on adjustment to heart disease, stress management and health-related lifestyle change. Teach and support self-help strategies.;Refer participants experiencing significant psychosocial distress to appropriate mental health specialists for further evaluation and treatment. When possible, include family members and significant others in education/counseling sessions.    Expected Outcomes Short Term: Participant demonstrates changes in health-related behavior, relaxation and other stress management skills, ability to obtain effective social support, and compliance with psychotropic medications if prescribed.;Long Term: Emotional wellbeing is indicated by absence of clinically significant psychosocial distress or social isolation.             Core Components/Risk Factors/Patient Goals Review:   Goals and Risk Factor Review     Row Name 11/07/22 0820 11/22/22 1728 12/20/22 1515         Core Components/Risk Factors/Patient Goals Review   Personal Goals Review Weight Management/Obesity;Hypertension;Lipids;Stress Weight Management/Obesity;Hypertension;Lipids;Stress Weight Management/Obesity;Hypertension;Lipids;Stress     Review Lenorah started intensive cardiac rehab on 11/06/22. Deslyn did well with exercise. Vital signs were stable Tanvee is doing  well with exercise atintensive cardiac rehab . Vital signs were stable Chasmine continues to do well with exercise at intensive cardiac rehab . Resting blood pressures have been within normal  limits. Some exertional BP elevations noted during exercise. Will continue to monitor BP     Expected Outcomes Malvena will continue to participate in intensive cardiac rehab for exercise, nutrition and lifestyle modifcations Lexany will continue to participate in intensive cardiac rehab for exercise, nutrition and lifestyle modifcations Dashanti will continue to participate in intensive cardiac rehab for exercise, nutrition and lifestyle modifcations              Core Components/Risk Factors/Patient Goals at Discharge (Final Review):   Goals and Risk Factor Review - 12/20/22 1515       Core Components/Risk Factors/Patient Goals Review   Personal Goals Review Weight Management/Obesity;Hypertension;Lipids;Stress    Review Hisaye continues to do well with exercise at intensive cardiac rehab . Resting blood pressures have been within normal limits. Some exertional BP elevations noted during exercise. Will continue to monitor BP    Expected Outcomes Voncille will continue to participate in intensive cardiac rehab for exercise, nutrition and lifestyle modifcations             ITP Comments:  ITP Comments     Row Name 11/01/22 1023 11/07/22 0812 11/22/22 1726 12/20/22 1513     ITP Comments Dr. Armanda Magic medical director. Introduction to prititkin education/ intesnive cardiac rehab. Initial orientation packet reviewed with patient. 30 day ITP Review. English started intensive cardiac rehab on 11/06/22 and did well with exercise. 30 day ITP Review. Jordynn has good attendance and participation in intensive cardiac rehab. 30 day ITP Review. Mattison continues to have  good attendance and participation in intensive cardiac rehab.             Comments: See ITP comments.Thayer Headings RN BSN

## 2022-12-22 ENCOUNTER — Encounter (HOSPITAL_COMMUNITY)
Admission: RE | Admit: 2022-12-22 | Discharge: 2022-12-22 | Disposition: A | Discharge status: Home / Self Care | Payer: PRIVATE HEALTH INSURANCE | Source: Ambulatory Visit | Attending: Cardiology | Admitting: Cardiology

## 2022-12-22 DIAGNOSIS — Z955 Presence of coronary angioplasty implant and graft: Secondary | ICD-10-CM

## 2022-12-24 NOTE — Progress Notes (Unsigned)
Cardiology Clinic Note   Patient Name: Kathryn [name] Date of Encounter: 12/24/2022  Primary Care Provider:  April Manson, NP Primary Cardiologist:  Olga Millers, MD  Patient Profile    Kathryn [name] 62 year old female presents to the clinic today for follow-up evaluation of her coronary artery disease and hypercholesterolemia.  Past Medical History    Past Medical History:  Diagnosis Date   AKI (acute kidney injury) (HCC) 01/21/2019   Anxiety    GERD (gastroesophageal reflux disease)    Graves disease    with hyperthyroidism diagnosed at age 78 (treated with propylthiouracil for 2 years)   Hyperlipidemia    Hypertension    Hypothyroidism    Sleep disturbance    Past Surgical History:  Procedure Laterality Date   CARPAL TUNNEL RELEASE     CESAREAN SECTION     CORONARY STENT INTERVENTION N/A 09/11/2022   Procedure: CORONARY STENT INTERVENTION;  Surgeon: Tonny Bollman, MD;  Location: [facility] INVASIVE CV LAB;  Service: Cardiovascular;  Laterality: N/A;   LEFT HEART CATH AND CORONARY ANGIOGRAPHY N/A 09/11/2022   Procedure: LEFT HEART CATH AND CORONARY ANGIOGRAPHY;  Surgeon: Tonny Bollman, MD;  Location: [facility] INVASIVE CV LAB;  Service: Cardiovascular;  Laterality: N/A;   Rt ovary and fallopian tube removed due to cysts      Allergies  Allergies  Allergen Reactions   Hydrocortisone Rash   Flagyl [Metronidazole] Nausea Only    Nausea and Headache   Tetracyclines & Related Nausea And Vomiting    History of Present Illness    Kathryn [name] has a PMH of hypertension, hyperlipidemia, and coronary artery disease.  She had renal Dopplers 9/20 which showed 1-59% bilateral renal artery stenosis.  Her coronary calcium score 11/21 was 2813.  She underwent cardiac catheterization 2/24 which showed subtotal occlusion of her mid LAD, severe stenosis of her mid right coronary artery and normal left ventricular end-diastolic pressures.  She received PCI with DES to her LAD and RCA.   Echocardiogram 3/24 showed normal LV function, G1 DD.  She had carotid Doppler studies done 3/24 which showed 1-39% bilateral stenosis.  She was seen in follow-up by Dr. Jens Som on 10/30/2022.  During that time she did not exertional chest discomfort, dyspnea on exertion, and syncope.  She did note occasional unusual feeling in her chest that would last for about 1 minute.  Her symptoms were not similar to her symptoms prior to cardiac stenting.  She presents to the clinic today for follow-up evaluation and states***.  *** denies chest pain, shortness of breath, lower extremity edema, fatigue, palpitations, melena, hematuria, hemoptysis, diaphoresis, weakness, presyncope, syncope, orthopnea, and PND.  Coronary artery disease-no chest pain today.  Denies exertional chest discomfort.  Underwent cardiac catheterization and received PCI with DES to her LAD and DES to her RCA 2/24.  Details above. Continue amlodipine, aspirin, Plavix, rosuvastatin Low-sodium high-fiber diet Maintain physical activity  Hyperlipidemia-LDL***. Continue aspirin, rosuvastatin High-fiber diet  Essential hypertension-BP today***. Maintain blood pressure log Continue amlodipine, losartan, hydralazine Heart healthy low-sodium diet-salty 6 given  Disposition: Follow-up with Dr. Jens Som or me in 6 months.  Home Medications    Prior to Admission medications   Medication Sig Start Date End Date Taking? Authorizing Provider  ADDERALL XR 30 MG 24 hr capsule Take 30 mg by mouth daily. 10/28/14   [provider]  albuterol (VENTOLIN HFA) 108 (90 Base) MCG/ACT inhaler Inhale 1 puff into the lungs every 6 (six) hours as needed for wheezing  or shortness of breath. 03/06/22 03/06/23  [provider]  ALPRAZolam Prudy Feeler) 0.5 MG tablet Take 0.5 mg by mouth at bedtime as needed for anxiety.    [provider]  amLODipine (NORVASC) 10 MG tablet Take 1 tablet (10 mg total) by mouth daily. 10/30/22   Lewayne Bunting, MD  aspirin 81 MG tablet Take 81 mg by mouth daily.    [provider]  Brimonidine Tartrate (LUMIFY) 0.025 % SOLN Place 1 drop into both eyes 4 (four) times daily as needed (dry eyes).    [provider]  buPROPion (WELLBUTRIN XL) 300 MG 24 hr tablet Take 300 mg by mouth every morning. 10/31/18   [provider]  cholecalciferol (VITAMIN D3) 25 MCG (1000 UNIT) tablet Take 1,000 Units by mouth daily.    [provider]  clobetasol (TEMOVATE) 0.05 % external solution Apply 1 Application topically 2 (two) times daily as needed (irritation).    [provider]  clopidogrel (PLAVIX) 75 MG tablet Take 1 tablet (75 mg total) by mouth daily. 09/25/22   Flossie Dibble, NP  cyanocobalamin (VITAMIN B12) 1000 MCG tablet Take 3,000 mcg by mouth daily.    [provider]  desvenlafaxine (PRISTIQ) 100 MG 24 hr tablet Take 100 mg by mouth every morning. 10/31/18   [provider]  levothyroxine (SYNTHROID) 112 MCG tablet Take 112 mcg by mouth daily before breakfast.    [provider]  losartan (COZAAR) 100 MG tablet Take 100 mg by mouth daily.    [provider]  MAGNESIUM GLYCINATE PO Take 420 mg by mouth daily.    [provider]  Melatonin 10 MG CHEW Chew 10 mg by mouth at bedtime.    [provider]  nitroGLYCERIN (NITROSTAT) 0.4 MG SL tablet Place 1 tablet (0.4 mg total) under the tongue every 5 (five) minutes as needed. 09/11/22   Arty Baumgartner, NP  Potassium Chloride ER 20 MEQ TBCR TAKE 1 TABLET BY MOUTH TWO  TIMES DAILY 01/30/17   Jake Bathe, MD  rosuvastatin (CRESTOR) 40 MG tablet TAKE 1 TABLET DAILY (SCHEDULE AN APPOINTMENT FOR FURTHER REFILLS) 10/06/22   Lewayne Bunting, MD  triamterene-hydrochlorothiazide (MAXZIDE) 75-50 MG tablet Take 1 tablet by mouth daily. Restart this after FU with PCP 01/30/19   Zannie Cove, MD  valACYclovir (VALTREX) 500 MG tablet Take 500 mg by mouth daily as  needed (for outbreaks).     [provider]    Family History    Family History  Problem Relation Age of Onset   Heart disease Mother        died of heart failure   Heart attack Mother 6   CAD Brother    She indicated that her mother is deceased. She indicated that her father is deceased. She indicated that her brother is alive.  Social History    Social History   Socioeconomic History   Marital status: Legally Separated    Spouse name: Not on file   Number of children: 1   Years of education: Not on file   Highest education level: Not on file  Occupational History   Not on file  Tobacco Use   Smoking status: Never   Smokeless tobacco: Never  Vaping Use   Vaping Use: Never used  Substance and Sexual Activity   Alcohol use: Yes   Drug use: No   Sexual activity: Not on file  Other Topics Concern   Not on file  Social History Narrative   Not on file   Social Determinants of Health   Financial Resource Strain: Not on file  Food Insecurity: Not on file  Transportation Needs: Not on file  Physical Activity: Not on file  Stress: Not on file  Social Connections: Not on file  Intimate Partner Violence: Not on file     Review of Systems    General:  No chills, fever, night sweats or weight changes.  Cardiovascular:  No chest pain, dyspnea on exertion, edema, orthopnea, palpitations, paroxysmal nocturnal dyspnea. Dermatological: No rash, lesions/masses Respiratory: No cough, dyspnea Urologic: No hematuria, dysuria Abdominal:   No nausea, vomiting, diarrhea, bright red blood per rectum, melena, or hematemesis Neurologic:  No visual changes, wkns, changes in mental status. All other systems reviewed and are otherwise negative except as noted above.  Physical Exam    VS:  There were no vitals taken for this visit. , BMI There is no height or weight on file to calculate BMI. GEN: Well nourished, well developed, in no acute distress. HEENT: normal. Neck:  Supple, no JVD, carotid bruits, or masses. Cardiac: RRR, no murmurs, rubs, or gallops. No clubbing, cyanosis, edema.  Radials/DP/PT 2+ and equal bilaterally.  Respiratory:  Respirations regular and unlabored, clear to auscultation bilaterally. GI: Soft, nontender, nondistended, BS + x 4. MS: no deformity or atrophy. Skin: warm and dry, no rash. Neuro:  Strength and sensation are intact. Psych: Normal affect.  Accessory Clinical Findings    Recent Labs: 09/04/2022: Hemoglobin 15.7; Platelets 304 10/30/2022: ALT 31; BUN 13; Creatinine, Ser 0.92; Potassium 4.1; Sodium 139   Recent Lipid Panel    Component Value Date/Time   CHOL 120 10/30/2022 1014   TRIG 98 10/30/2022 1014   HDL 43 10/30/2022 1014   CHOLHDL 2.8 10/30/2022 1014   LDLCALC 58 10/30/2022 1014    No BP recorded.  {Refresh Note OR Click here to enter BP  :1}***    ECG personally reviewed by me today- *** - No acute changes   Cardiac catheterization 09/11/2022   1.  Severe near subtotal occlusion of the mid LAD with heavy calcification, lesion treated successfully with PTCA and stenting using a 3.0 x 16 mm Synergy DES, reducing 99% stenosis to 0% post PCI. 2.  Severe mid RCA stenosis, treated successfully with a 3.5 x 16 mm Synergy DES 3.  Mild nonobstructive plaquing in the left main and left circumflex appropriate for medical therapy 4.  Normal LVEDP   Recommendations: Aggressive risk reduction measures, DAPT with aspirin and clopidogrel minimum 6 months, favor 12 months if tolerated due to complexity of CAD, same-day PCI discharge if criteria met.  Diagnostic Dominance: Right  Intervention      Assessment & Plan   1.  ***   Thomasene Ripple. Retaj Hilbun NP-C     12/24/2022, 2:21 PM [facility] [address] Office [phone] Fax [phone]    I spent***minutes examining this patient, reviewing medications, and using patient centered shared decision making involving  her cardiac care.  Prior to her visit I spent greater than 20 minutes reviewing her past medical history,  medications, and prior cardiac tests.

## 2022-12-25 ENCOUNTER — Telehealth (HOSPITAL_COMMUNITY): Payer: Self-pay

## 2022-12-25 ENCOUNTER — Encounter (HOSPITAL_COMMUNITY)
Admission: RE | Admit: 2022-12-25 | Discharge: 2022-12-25 | Disposition: A | Discharge status: Home / Self Care | Payer: PRIVATE HEALTH INSURANCE | Source: Ambulatory Visit | Attending: Cardiology | Admitting: Cardiology

## 2022-12-25 DIAGNOSIS — Z955 Presence of coronary angioplasty implant and graft: Secondary | ICD-10-CM | POA: Diagnosis not present

## 2022-12-25 NOTE — Telephone Encounter (Signed)
Called and spoke with Kathryn [name]. With [employer] who stated at 1st pt had no sessions limit with [facility]. Kathryn [name] with last [phone] given by Bri with [employer], who stated pt had a 36 sessions/visit limit. After Azia placed me on hold, she stated pt does have a 36 sessions/visit limit but as to date showing pt has used 0 out of 36. Adv Kathryn [name] pt has been in [facility] since 11/01/22.  BJY#[id number] with Kathryn.

## 2022-12-27 ENCOUNTER — Encounter: Payer: Self-pay | Admitting: General Practice

## 2022-12-27 ENCOUNTER — Ambulatory Visit: Payer: PRIVATE HEALTH INSURANCE | Attending: General Practice | Admitting: General Practice

## 2022-12-27 ENCOUNTER — Encounter (HOSPITAL_COMMUNITY)
Admission: RE | Admit: 2022-12-27 | Discharge: 2022-12-27 | Disposition: A | Discharge status: Home / Self Care | Payer: PRIVATE HEALTH INSURANCE | Source: Ambulatory Visit | Attending: Cardiology | Admitting: Cardiology

## 2022-12-27 VITALS — BP 128/76 | HR 72 | Ht 64.0 in | Wt 160.0 lb

## 2022-12-27 DIAGNOSIS — E7800 Pure hypercholesterolemia, unspecified: Secondary | ICD-10-CM | POA: Diagnosis not present

## 2022-12-27 DIAGNOSIS — I10 Essential (primary) hypertension: Secondary | ICD-10-CM

## 2022-12-27 DIAGNOSIS — I2510 Atherosclerotic heart disease of native coronary artery without angina pectoris: Secondary | ICD-10-CM

## 2022-12-27 DIAGNOSIS — G479 Sleep disorder, unspecified: Secondary | ICD-10-CM

## 2022-12-27 DIAGNOSIS — I2583 Coronary atherosclerosis due to lipid rich plaque: Secondary | ICD-10-CM

## 2022-12-27 DIAGNOSIS — Z955 Presence of coronary angioplasty implant and graft: Secondary | ICD-10-CM

## 2022-12-27 NOTE — Patient Instructions (Signed)
Medication Instructions:  The current medical regimen is effective;  continue present plan and medications as directed. Please refer to the Current Medication list given to you today.  *If you need a refill on your cardiac medications before your next appointment, please call your pharmacy*  Lab Work: NONE ordered at this time of appointment  If you have labs (blood work) drawn today and your tests are completely normal, you will receive your results only by: MyChart Message (if you have MyChart) OR  A paper copy in the mail If you have any lab test that is abnormal or we need to change your treatment, we will call you to review the results.  Other Instructions SEE BELOW FOR SLEEP AND COMPRESSION STOCKING INSTRUCTIONS   Follow-Up: At [facility], you and your health needs are our priority.  As part of our continuing mission to provide you with exceptional heart care, we have created designated Provider Care Teams.  These Care Teams include your primary Cardiologist (physician) and Advanced Practice Providers (APPs -  Physician Assistants and Nurse Practitioners) who all work together to provide you with the care you need, when you need it.  Your next appointment:   6 month(s)  Provider:   Olga Millers, MD  or Edd Fabian, FNP       Quality Sleep Information, Adult Quality sleep is important for your mental and physical health. It also improves your quality of life. Quality sleep means you: Are asleep for most of the time you are in bed. Fall asleep within 30 minutes. Wake up no more than once a night. Are awake for no longer than 20 minutes if you do wake up during the night. Most adults need 7-8 hours of quality sleep each night. How can poor sleep affect me? If you do not get enough quality sleep, you may have: Mood swings. Daytime sleepiness. Decreased alertness, reaction time, and concentration. Sleep disorders, such as insomnia and sleep apnea. Difficulty  with: Solving problems. Coping with stress. Paying attention. These issues may affect your performance and productivity at work, school, and home. Lack of sleep may also put you at higher risk for accidents, suicide, and risky behaviors. If you do not get quality sleep, you may also be at higher risk for several health problems, including: Infections. Type 2 diabetes. Heart disease. High blood pressure. Obesity. Worsening of long-term conditions, like arthritis, kidney disease, depression, Parkinson's disease, and epilepsy. What actions can I take to get more quality sleep? Sleep schedule and routine Stick to a sleep schedule. Go to sleep and wake up at about the same time each day. Do not try to sleep less on weekdays and make up for lost sleep on weekends. This does not work. Limit naps during the day to 30 minutes or less. Do not take naps in the late afternoon. Make time to relax before bed. Reading, listening to music, or taking a hot bath promotes quality sleep. Make your bedroom a place that promotes quality sleep. Keep your bedroom dark, quiet, and at a comfortable room temperature. Make sure your bed is comfortable. Avoid using electronic devices that give off bright blue light for 30 minutes before bedtime. Your brain perceives bright blue light as sunlight. This includes television, phones, and computers. If you are lying awake in bed for longer than 20 minutes, get up and do a relaxing activity until you feel sleepy. Lifestyle     Try to get at least 30 minutes of exercise on most days.  Do not exercise 2-3 hours before going to bed. Do not use any products that contain nicotine or tobacco. These products include cigarettes, chewing tobacco, and vaping devices, such as e-cigarettes. If you need help quitting, ask your health care provider. Do not drink caffeinated beverages for at least 8 hours before going to bed. Coffee, tea, and some sodas contain caffeine. Do not drink  alcohol or eat large meals close to bedtime. Try to get at least 30 minutes of sunlight every day. Morning sunlight is best. Medical concerns Work with your health care provider to treat medical conditions that may affect sleeping, such as: Nasal obstruction. Snoring. Sleep apnea and other sleep disorders. Talk to your health care provider if you think any of your prescription medicines may cause you to have difficulty falling or staying asleep. If you have sleep problems, talk with a sleep consultant. If you think you have a sleep disorder, talk with your health care provider about getting evaluated by a specialist. Where to find more information Sleep Foundation: sleepfoundation.org American Academy of Sleep Medicine: aasm.org Centers for Disease Control and Prevention (CDC): TonerPromos.no Contact a health care provider if: You have trouble getting to sleep or staying asleep. You often wake up very early in the morning and cannot get back to sleep. You have daytime sleepiness. You have daytime sleep attacks of suddenly falling asleep and sudden muscle weakness (narcolepsy). You have a tingling sensation in your legs with a strong urge to move your legs (restless legs syndrome). You stop breathing briefly during sleep (sleep apnea). You think you have a sleep disorder or are taking a medicine that is affecting your quality of sleep. Summary Most adults need 7-8 hours of quality sleep each night. Getting enough quality sleep is important for your mental and physical health. Make your bedroom a place that promotes quality sleep, and avoid things that may cause you to have poor sleep, such as alcohol, caffeine, smoking, or large meals. Talk to your health care provider if you have trouble falling asleep or staying asleep. This information is not intended to replace advice given to you by your health care provider. Make sure you discuss any questions you have with your health care  provider. Document Revised: 10/26/2021 Document Reviewed: 10/26/2021 Elsevier Patient Education  2024 Elsevier Inc.  PLEASE PURCHASE AND WEAR COMPRESSION STOCKINGS DAILY AND TAKE OFF AT BEDTIME. Compression stockings are elastic socks that squeeze the legs. They help to increase blood flow to the legs and to decrease swelling in the legs from fluid retention, and reduce the chance of developing blood clots in the lower legs. Please put on in the AM when dressing and off at night when dressing for bed.  LET THEM KNOW THAT YOU NEED KNEE HIGH'S WITH COMPRESSION OF 15-20 mmhg.  [employer]  [address] ([phone]); [place], [place] [address]; [phone]  EMAIL   [email or web address].  PLEASE MAKE SURE TO ELEVATE YOUR FEET & LEGS WHILE SITTING, THIS WILL HELP WITH THE SWELLING ALSO.

## 2022-12-28 LAB — CBC
Hematocrit: 42.1 % (ref 34.0–46.6)
Hemoglobin: 14.9 g/dL (ref 11.1–15.9)
MCH: 30.7 pg (ref 26.6–33.0)
MCHC: 35.4 g/dL (ref 31.5–35.7)
MCV: 87 fL (ref 79–97)
Platelets: 325 10*3/uL (ref 150–450)
RBC: 4.86 x10E6/uL (ref 3.77–5.28)
RDW: 12.2 % (ref 11.7–15.4)
WBC: 9 10*3/uL (ref 3.4–10.8)

## 2022-12-29 ENCOUNTER — Encounter (HOSPITAL_COMMUNITY): Payer: PRIVATE HEALTH INSURANCE

## 2023-01-01 ENCOUNTER — Encounter (HOSPITAL_COMMUNITY)
Admission: RE | Admit: 2023-01-01 | Discharge: 2023-01-01 | Disposition: A | Discharge status: Home / Self Care | Payer: PRIVATE HEALTH INSURANCE | Source: Ambulatory Visit | Attending: Cardiology | Admitting: Cardiology

## 2023-01-01 ENCOUNTER — Encounter (HOSPITAL_COMMUNITY): Payer: PRIVATE HEALTH INSURANCE

## 2023-01-01 DIAGNOSIS — Z955 Presence of coronary angioplasty implant and graft: Secondary | ICD-10-CM | POA: Diagnosis not present

## 2023-01-03 ENCOUNTER — Encounter (HOSPITAL_COMMUNITY)
Admission: RE | Admit: 2023-01-03 | Discharge: 2023-01-03 | Disposition: A | Discharge status: Home / Self Care | Payer: PRIVATE HEALTH INSURANCE | Source: Ambulatory Visit | Attending: Cardiology | Admitting: Cardiology

## 2023-01-03 ENCOUNTER — Encounter (HOSPITAL_COMMUNITY): Payer: PRIVATE HEALTH INSURANCE

## 2023-01-03 VITALS — Ht 63.5 in | Wt 160.9 lb

## 2023-01-03 DIAGNOSIS — Z955 Presence of coronary angioplasty implant and graft: Secondary | ICD-10-CM | POA: Diagnosis not present

## 2023-01-03 NOTE — Progress Notes (Signed)
Discharge Progress Report  Patient Details  Name: Kathryn [name] MRN: [id number] Date of Birth: Sep 28, 1960 Referring Provider:   Flowsheet Row INTENSIVE CARDIAC REHAB ORIENT from 11/01/2022 in [facility]  Referring Provider Olga Millers, MD        Number of Visits: 6  Reason for Discharge:  Patient reached a stable level of exercise. Patient independent in their exercise. Patient has met program and personal goals.  Smoking History:  Social History   Tobacco Use  Smoking Status Never  Smokeless Tobacco Never    Diagnosis:  S/P drug eluting coronary stent placement  ADL [facility]:   Initial Exercise Prescription:  Initial Exercise Prescription - 11/01/22 1300       Date of Initial Exercise RX and Referring Provider   Date 11/01/22    Referring Provider Olga Millers, MD    Expected Discharge Date 12/15/22      Treadmill   MPH 3    Grade 0    Minutes 15    METs 3.5      Arm Ergometer   Level 1    Watts 20    RPM 60    Minutes 15    METs 2.5      Prescription Details   Frequency (times per week) 3    Duration Progress to 30 minutes of continuous aerobic without signs/symptoms of physical distress      Intensity   THRR 40-80% of Max Heartrate 63-126    Ratings of Perceived Exertion 11-13    Perceived Dyspnea 0-4      Progression   Progression Continue progressive overload as per policy without signs/symptoms or physical distress.      Resistance Training   Training Prescription Yes    Weight 3    Reps 10-15             Discharge Exercise Prescription (Final Exercise Prescription Changes):  Exercise Prescription Changes - 01/05/23 1642       Response to Exercise   Blood Pressure (Admit) 118/72    Blood Pressure (Exercise) 168/86    Blood Pressure (Exit) 108/68    Heart Rate (Admit) 85 bpm    Heart Rate (Exercise) 148 bpm    Heart Rate (Exit) 93 bpm    Rating of Perceived  Exertion (Exercise) 13.5    Symptoms None    Comments Pt graduated the [employer] program    Duration Continue with 30 min of aerobic exercise without signs/symptoms of physical distress.    Intensity THRR New   63-143     Progression   Progression Continue to progress workloads to maintain intensity without signs/symptoms of physical distress.    Average METs 4.86      Resistance Training   Training Prescription Yes    Weight 3 lbs wts    Reps 10-15    Time 10 Minutes      Interval Training   Interval Training No      Treadmill   MPH 3.7    Grade 2.5    Minutes 15    METs 5.62      Arm Ergometer   Level 4    Watts 42    RPM 72    Minutes 15    METs 4.1      Home Exercise Plan   Plans to continue exercise at Home (comment)    Frequency Add 3 additional days to program exercise sessions.  Initial Home Exercises Provided 11/27/22             Functional Capacity:  6 Minute Walk     Row Name 11/01/22 1312 01/03/23 1638       6 Minute Walk   Phase Initial Discharge    Distance 1800 feet 2280 feet    Distance % Change -- 26.67 %    Distance Feet Change -- 480 ft    Walk Time 6 minutes 6 minutes    # of Rest Breaks 0 0    MPH 3.41 4.32    METS 4.06 5.43    RPE 12 13    Perceived Dyspnea  0 0    VO2 Peak 14.23 19.01    Symptoms No No    Resting HR 64 bpm 80 bpm    Resting BP 112/60 120/60    Resting Oxygen Saturation  97 % --    Exercise Oxygen Saturation  during 6 min walk 98 % --    Max Ex. HR 92 bpm 121 bpm    Max Ex. BP 132/68 156/78    2 Minute Post BP 120/66 114/70             Psychological, QOL, Others - Outcomes: PHQ 2/9:    01/03/2023    4:32 PM 11/01/2022    1:11 PM  Depression screen PHQ 2/9  Decreased Interest 1 1  Down, Depressed, Hopeless 1 1  PHQ - 2 Score 2 2  Altered sleeping 1 3  Tired, decreased energy 1 2  Change in appetite 0 1  Feeling bad or failure about yourself  1 1  Trouble concentrating 1 3  Moving slowly  or fidgety/restless 1 1  Suicidal thoughts 0 0  PHQ-9 Score 7 13  Difficult doing work/chores Somewhat difficult Somewhat difficult    Quality of Life:  Quality of Life - 01/08/23 1050       Quality of Life   Select Quality of Life      Quality of Life Scores   Health/Function Post 22.61 %    Socioeconomic Post 21 %    Psych/Spiritual Post 20.36 %    Family Post 22.5 %    GLOBAL Post 21.65 %             Personal Goals: Goals established at orientation with interventions provided to work toward goal.  Personal Goals and Risk Factors at Admission - 11/01/22 1322       Core Components/Risk Factors/Patient Goals on Admission   Hypertension Yes    Intervention Provide education on lifestyle modifcations including regular physical activity/exercise, weight management, moderate sodium restriction and increased consumption of fresh fruit, vegetables, and low fat dairy, alcohol moderation, and smoking cessation.;Monitor prescription use compliance.    Expected Outcomes Short Term: Continued assessment and intervention until BP is < 140/69mm HG in hypertensive participants. < 130/37mm HG in hypertensive participants with diabetes, heart failure or chronic kidney disease.;Long Term: Maintenance of blood pressure at goal levels.    Lipids Yes    Intervention Provide education and support for participant on nutrition & aerobic/resistive exercise along with prescribed medications to achieve LDL 70mg , HDL >40mg .    Expected Outcomes Short Term: Participant states understanding of desired cholesterol values and is compliant with medications prescribed. Participant is following exercise prescription and nutrition guidelines.;Long Term: Cholesterol controlled with medications as prescribed, with individualized exercise RX and with personalized nutrition plan. Value goals: LDL < 70mg , HDL > 40 mg.  Stress Yes    Intervention Offer individual and/or small group education and counseling on  adjustment to heart disease, stress management and health-related lifestyle change. Teach and support self-help strategies.;Refer participants experiencing significant psychosocial distress to appropriate mental health specialists for further evaluation and treatment. When possible, include family members and significant others in education/counseling sessions.    Expected Outcomes Short Term: Participant demonstrates changes in health-related behavior, relaxation and other stress management skills, ability to obtain effective social support, and compliance with psychotropic medications if prescribed.;Long Term: Emotional wellbeing is indicated by absence of clinically significant psychosocial distress or social isolation.              Personal Goals Discharge:  Goals and Risk Factor Review     Row Name 11/07/22 0820 11/22/22 1728 12/20/22 1515         Core Components/Risk Factors/Patient Goals Review   Personal Goals Review Weight Management/Obesity;Hypertension;Lipids;Stress Weight Management/Obesity;Hypertension;Lipids;Stress Weight Management/Obesity;Hypertension;Lipids;Stress     Review Kathryn [name] started intensive cardiac rehab on 11/06/22. Kathryn [name] did well with exercise. Vital signs were stable Kathryn [name] is doing  well with exercise atintensive cardiac rehab . Vital signs were stable Kathryn [name] continues to do well with exercise at intensive cardiac rehab . Resting blood pressures have been within normal limits. Some exertional BP elevations noted during exercise. Will continue to monitor BP     Expected Outcomes Kathryn [name] will continue to participate in intensive cardiac rehab for exercise, nutrition and lifestyle modifcations Kathryn [name] will continue to participate in intensive cardiac rehab for exercise, nutrition and lifestyle modifcations Kathryn [name] will continue to participate in intensive cardiac rehab for exercise, nutrition and lifestyle modifcations              Exercise Goals and Review:  Exercise  Goals     Row Name 11/01/22 1028             Exercise Goals   Increase Physical Activity Yes       Intervention Provide advice, education, support and counseling about physical activity/exercise needs.;Develop an individualized exercise prescription for aerobic and resistive training based on initial evaluation findings, risk stratification, comorbidities and participant's personal goals.       Expected Outcomes Short Term: Attend rehab on a regular basis to increase amount of physical activity.;Long Term: Exercising regularly at least 3-5 days a week.;Long Term: Add in home exercise to make exercise part of routine and to increase amount of physical activity.       Increase Strength and Stamina Yes       Intervention Develop an individualized exercise prescription for aerobic and resistive training based on initial evaluation findings, risk stratification, comorbidities and participant's personal goals.;Provide advice, education, support and counseling about physical activity/exercise needs.       Expected Outcomes Short Term: Increase workloads from initial exercise prescription for resistance, speed, and METs.;Short Term: Perform resistance training exercises routinely during rehab and add in resistance training at home;Long Term: Improve cardiorespiratory fitness, muscular endurance and strength as measured by increased METs and functional capacity ( )       Able to understand and use rate of perceived exertion (RPE) scale Yes       Intervention Provide education and explanation on how to use RPE scale       Expected Outcomes Short Term: Able to use RPE daily in rehab to express subjective intensity level;Long Term:  Able to use RPE to guide intensity level when exercising independently       Knowledge and understanding of Target  Heart Rate Range (THRR) Yes       Intervention Provide education and explanation of THRR including how the numbers were predicted and where they are located for  reference       Expected Outcomes Short Term: Able to state/look up THRR;Short Term: Able to use daily as guideline for intensity in rehab;Long Term: Able to use THRR to govern intensity when exercising independently       Understanding of Exercise Prescription Yes       Intervention Provide education, explanation, and written materials on patient's individual exercise prescription       Expected Outcomes Short Term: Able to explain program exercise prescription;Long Term: Able to explain home exercise prescription to exercise independently                Exercise Goals Re-Evaluation:  Exercise Goals Re-Evaluation     Row Name 11/06/22 1605 11/27/22 1643 01/05/23 1648         Exercise Goal Re-Evaluation   Exercise Goals Review Increase Physical Activity;Able to understand and use rate of perceived exertion (RPE) scale;Increase Strength and Stamina Increase Physical Activity;Able to understand and use rate of perceived exertion (RPE) scale;Increase Strength and Stamina;Understanding of Exercise Prescription;Knowledge and understanding of Target Heart Rate Range (THRR) Increase Physical Activity;Able to understand and use rate of perceived exertion (RPE) scale;Increase Strength and Stamina;Understanding of Exercise Prescription;Knowledge and understanding of Target Heart Rate Range (THRR)     Comments Patient able to understand and use RPE scale appropriately. Reviewed MET's, goals and home ExRx. pt tolerated exercise well with an average MEt level of 4.17. Pt had increase HR over THRR, will request INC THRR this week. Pt will continue to exercise by walking, hand weights and using resistance bands 3-4 days for 20-30 mins, with a push towards reaching 30 mins each session. Pt graduated the [employer]. pt tolerated exercise well with an average MET level of 4.86. Pt progressed well in the program and increased her post by 26.67%. Pt will continue to exercise by walking, using weight  and resistance bands 5 days for 30-45 mins per session.     Expected Outcomes Progress workloads as tolerated to help increase strength and stamina. Pt will continue to exercise on her own Pt will continue to exercise on her own              Nutrition & Weight - Outcomes:  Pre Biometrics - 11/01/22 1026       Pre Biometrics   Waist Circumference 37.5 inches    Hip Circumference 41.5 inches    Waist to Hip Ratio 0.9 %    Triceps Skinfold 27 mm    % Body Fat 39.8 %    Grip Strength 28 kg    Flexibility 17.5 in    Single Leg Stand 22.56 seconds             Post Biometrics - 01/03/23 1640        Post  Biometrics   Height 5' 3.5" (1.613 m)    Weight 73 kg    Waist Circumference 36 inches    Hip Circumference 41.5 inches    Waist to Hip Ratio 0.87 %    BMI (Calculated) 28.06    Triceps Skinfold 23 mm    % Body Fat 38.1 %    Grip Strength 32 kg    Flexibility 21 in    Single Leg Stand 30 seconds  Nutrition:  Nutrition Therapy & Goals - 01/05/23 1554       Nutrition Therapy   Diet Heart Healthy Diet    Drug/Food Interactions Statins/Certain Fruits      Personal Nutrition Goals   Nutrition Goal Patient to identify strategies for reducing cardiovascular risk by attending the [employer] education and nutrition series weekly.    Personal Goal #2 Patient to improve diet quality by using the plate method as a guide for meal planning to include lean protein/plant protein, fruits, vegetables, whole grains, nonfat dairy as part of a well-balanced diet.    Comments Goals in action. Kathryn [name] continues to attend the [employer] education and nutrition series regularly. She is down 3.5# since starting with our program. She has started making many dietary changes including reduced processed foods and increased sodium. Answered patient questions regarding electrolyte drinks and zero calorie drink mixes. Patient will benefit from participation in intensive cardiac rehab for  nutrition, exercise, and lifestyle modification.      Intervention Plan   Intervention Prescribe, educate and counsel regarding individualized specific dietary modifications aiming towards targeted core components such as weight, hypertension, lipid management, diabetes, heart failure and other comorbidities.;Nutrition handout(s) given to patient.    Expected Outcomes Short Term Goal: Understand basic principles of dietary content, such as calories, fat, sodium, cholesterol and nutrients.;Long Term Goal: Adherence to prescribed nutrition plan.             Nutrition Discharge:  Nutrition Assessments - 11/10/22 1000       Rate Your Plate Scores   Pre Score 39             Education Questionnaire Score:  Knowledge Questionnaire Score - 01/08/23 1051       Knowledge Questionnaire Score   Post Score 24/24             Goals reviewed with patient; copy given to patient.Pt graduates from  Intensive/Traditional cardiac rehab program on 12/31/22  with completion of  48 exercise and education sessions. Pt maintained good attendance and progressed nicely during their participation in rehab as evidenced by increased MET level. Kathryn [name] increased her distanceon her post exercise walk test by 480 feet.  Kathryn [name] lost 2.2 lbs.  Medication list reconciled. Repeat  PHQ score- 7 .  Pt has made significant lifestyle changes and should be commended for their success. Kathryn [name]  achieved their goals during cardiac rehab.   Pt plans to continue exercise at walking and using hand weights. We are proud of Kathryn [name]'s progress!Thayer Headings RN BSN

## 2023-01-05 ENCOUNTER — Encounter (HOSPITAL_COMMUNITY)
Admission: RE | Admit: 2023-01-05 | Discharge: 2023-01-05 | Disposition: A | Discharge status: Home / Self Care | Payer: PRIVATE HEALTH INSURANCE | Source: Ambulatory Visit | Attending: Cardiology | Admitting: Cardiology

## 2023-01-05 ENCOUNTER — Encounter (HOSPITAL_COMMUNITY): Payer: PRIVATE HEALTH INSURANCE

## 2023-01-05 DIAGNOSIS — Z955 Presence of coronary angioplasty implant and graft: Secondary | ICD-10-CM

## 2023-05-01 ENCOUNTER — Telehealth: Payer: Self-pay | Admitting: Cardiology

## 2023-05-01 NOTE — Telephone Encounter (Signed)
Had 2 stents placed 09/11/22; did Cardiac Rehab for 8 weeks; has been exercising and eating better. About a month and a half ago- noticed that she was starting to experience sob like she had before. She reports increased shortness of breath with activity and decreased energy. She gets worn out so fast- Not pain- just heavy breathing/shortness of breath/has to rest/stop often. She reports no pain anywhere. (Just Stiffness when gets up in the morning) No dizziness, no fainting, so sob at rest; no chest/jaw/arm/back pain, no nausea, no sweating.  Appt made to see Bernadene Person, NP 05/03/23 at 2:45. Given ER precautions. She verbalized understanding.

## 2023-05-01 NOTE — Telephone Encounter (Signed)
Pt c/o Shortness Of Breath: STAT if SOB developed within the last 24 hours or pt is noticeably SOB on the phone  1. Are you currently SOB (can you hear that pt is SOB on the phone)? No   2. How long have you been experiencing SOB? Month and a half   3. Are you SOB when sitting or when up moving around? Moving around   4. Are you currently experiencing any other symptoms? Fatigue

## 2023-05-03 ENCOUNTER — Encounter: Payer: Self-pay | Admitting: Nurse Practitioner

## 2023-05-03 ENCOUNTER — Ambulatory Visit: Payer: Managed Care, Other (non HMO) | Attending: Nurse Practitioner | Admitting: Nurse Practitioner

## 2023-05-03 VITALS — BP 116/72 | HR 96 | Ht 63.5 in | Wt 158.0 lb

## 2023-05-03 DIAGNOSIS — I2510 Atherosclerotic heart disease of native coronary artery without angina pectoris: Secondary | ICD-10-CM | POA: Diagnosis not present

## 2023-05-03 DIAGNOSIS — I6523 Occlusion and stenosis of bilateral carotid arteries: Secondary | ICD-10-CM

## 2023-05-03 DIAGNOSIS — I25118 Atherosclerotic heart disease of native coronary artery with other forms of angina pectoris: Secondary | ICD-10-CM | POA: Diagnosis not present

## 2023-05-03 DIAGNOSIS — I701 Atherosclerosis of renal artery: Secondary | ICD-10-CM

## 2023-05-03 DIAGNOSIS — R0609 Other forms of dyspnea: Secondary | ICD-10-CM

## 2023-05-03 DIAGNOSIS — I10 Essential (primary) hypertension: Secondary | ICD-10-CM | POA: Diagnosis not present

## 2023-05-03 DIAGNOSIS — E785 Hyperlipidemia, unspecified: Secondary | ICD-10-CM

## 2023-05-03 DIAGNOSIS — E039 Hypothyroidism, unspecified: Secondary | ICD-10-CM

## 2023-05-03 NOTE — Progress Notes (Signed)
Office Visit    Patient Name: Kathryn [name] Date of Encounter: 05/03/2023  Primary Care Provider:  April Manson, NP Primary Cardiologist:  Olga Millers, MD  Chief Complaint    62 year old female with a history of CAD s/p PTCA/DES-mid LAD, DES-mid RCA in 08/2022, bilateral carotid artery stenosis, hypertension, hyperlipidemia, hypothyroidism, renal artery stenosis, and GERD who presents for follow-up related to CAD and shortness of breath.  Past Medical History    Past Medical History:  Diagnosis Date   AKI (acute kidney injury) (HCC) 01/21/2019   Anxiety    GERD (gastroesophageal reflux disease)    Graves disease    with hyperthyroidism diagnosed at age 12 (treated with propylthiouracil for 2 years)   Hyperlipidemia    Hypertension    Hypothyroidism    Sleep disturbance    Past Surgical History:  Procedure Laterality Date   CARPAL TUNNEL RELEASE     CESAREAN SECTION     CORONARY STENT INTERVENTION N/A 09/11/2022   Procedure: CORONARY STENT INTERVENTION;  Surgeon: Tonny Bollman, MD;  Location: [facility] INVASIVE CV LAB;  Service: Cardiovascular;  Laterality: N/A;   LEFT HEART CATH AND CORONARY ANGIOGRAPHY N/A 09/11/2022   Procedure: LEFT HEART CATH AND CORONARY ANGIOGRAPHY;  Surgeon: Tonny Bollman, MD;  Location: [facility] INVASIVE CV LAB;  Service: Cardiovascular;  Laterality: N/A;   Rt ovary and fallopian tube removed due to cysts      Allergies  Allergies  Allergen Reactions   Hydrocortisone Rash   Flagyl [Metronidazole] Nausea Only    Nausea and Headache   Tetracyclines & Related Nausea And Vomiting     Labs/Other Studies Reviewed    The following studies were reviewed today:  Cardiac Studies & Procedures   CARDIAC CATHETERIZATION  CARDIAC CATHETERIZATION 09/11/2022  Narrative 1.  Severe near subtotal occlusion of the mid LAD with heavy calcification, lesion treated successfully with PTCA and stenting using a 3.0 x 16 mm Synergy DES, reducing 99% stenosis to  0% post PCI. 2.  Severe mid RCA stenosis, treated successfully with a 3.5 x 16 mm Synergy DES 3.  Mild nonobstructive plaquing in the left main and left circumflex appropriate for medical therapy 4.  Normal LVEDP  Recommendations: Aggressive risk reduction measures, DAPT with aspirin and clopidogrel minimum 6 months, favor 12 months if tolerated due to complexity of CAD, same-day PCI discharge if criteria met.  Findings Coronary Findings Diagnostic  Dominance: Right  Left Anterior Descending Mid LAD lesion is 99% stenosed. The lesion is severely calcified. Mid LAD to Dist LAD lesion is 70% stenosed. The lesion is calcified. LAD is small caliber through this area  Left Circumflex Mid Cx to Dist Cx lesion is 40% stenosed.  Right Coronary Artery Mid RCA lesion is 75% stenosed. The lesion is calcified.  Intervention  Mid LAD lesion Stent CATH [place]ISTA GUIDE 6FR XBLAD3.5 guide catheter was inserted. Lesion crossed with guidewire using a WIRE COUGAR XT STRL 190CM. Pre-stent angioplasty was performed using a BALLOON TAKERU 2.0X12. Maximum pressure:  12 atm. A drug-eluting stent was successfully placed using a SYNERGY XD 3.0X16. Post-stent angioplasty was performed using a BALLN [place] EMERGE MR [id number]. Maximum pressure:  18 atm. Initially, the 2.0 mm balloon is used to predilate the lesion, then a 3.0 noncompliant balloon is used to predilate the lesion to make sure that we would get good stent expansion.  Both balloons inflated fully.  The lesion is stented and postdilated without complication. Post-Intervention Lesion Assessment The intervention was successful. Pre-interventional TIMI  flow is 2. Post-intervention TIMI flow is 3. No complications occurred at this lesion. There is a 0% residual stenosis post intervention.  Mid RCA lesion Stent CATH LAUNCHER 6FR JR4 guide catheter was inserted. Lesion crossed with guidewire using a WIRE COUGAR XT STRL 190CM. Pre-stent angioplasty was performed  using a BALL SAPPHIRE NC24 3.0X12. A drug-eluting stent was successfully placed using a SYNERGY XD 3.50X16. Post-stent angioplasty was performed using a BALLN [place] EUPHORA RX [id number]. Maximum pressure:  18 atm. Post-Intervention Lesion Assessment The intervention was successful. Pre-interventional TIMI flow is 3. Post-intervention TIMI flow is 3. No complications occurred at this lesion. There is a 0% residual stenosis post intervention.   STRESS TESTS  MYOCARDIAL PERFUSION IMAGING 03/27/2019  Narrative  There was no ST segment deviation noted during stress.  Nuclear stress EF: 56%.  The left ventricular ejection fraction is normal (55-65%).  Defect 1: There is a small fixed defect of mild severity present in the apical lateral location. Artifact vs small infarct. Suspect artifact, as normal wall motion.  The study is normal.  This is a low risk study.   ECHOCARDIOGRAM  ECHOCARDIOGRAM COMPLETE 09/28/2022  Narrative ECHOCARDIOGRAM REPORT    Patient Name:   Kathryn [name] Date of Exam: 09/28/2022 Medical Rec #:  [id number]      Height:       64.0 in Accession #:    [id number]     Weight:       164.0 lb Date of Birth:  03/24/1961       BSA:          1.798 m Patient Age:    61 years       BP:           154/91 mmHg Patient Gender: F              HR:           73 bpm. Exam Location:  [address]  Procedure: 2D Echo, 3D Echo, Cardiac Doppler and Color Doppler  Indications:    I25.10 CAD  History:        Patient has prior history of Echocardiogram examinations, most recent 01/22/2019. Carotid Disease; Risk Factors:Hypertension and HLD.  Sonographer:    Clearence Ped RCS Referring Phys: [id number] BRIAN S CRENSHAW  IMPRESSIONS   1. Left ventricular ejection fraction, by estimation, is 60 to 65%. The left ventricle has normal function. The left ventricle has no regional wall motion abnormalities. Left ventricular diastolic parameters are consistent with Grade I diastolic dysfunction  (impaired relaxation). 2. Right ventricular systolic function is normal. The right ventricular size is normal. 3. The mitral valve is normal in structure. No evidence of mitral valve regurgitation. No evidence of mitral stenosis. 4. The aortic valve is tricuspid. Aortic valve regurgitation is not visualized. No aortic stenosis is present. 5. Aortic dilatation noted. 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 6. The inferior vena cava is normal in size with greater than 50% respiratory variability, suggesting right atrial pressure of 3 mmHg.  FINDINGS Left Ventricle: Left ventricular ejection fraction, by estimation, is 60 to 65%. The left ventricle has normal function. The left ventricle has no regional wall motion abnormalities. The left ventricular internal cavity size was normal in size. There is no left ventricular hypertrophy. Left ventricular diastolic parameters are consistent with Grade I diastolic dysfunction (impaired relaxation). Normal left ventricular filling pressure.  Right Ventricle: The right ventricular size is normal. No increase in right ventricular  wall thickness. Right ventricular systolic function is normal.  Left Atrium: Left atrial size was normal in size.  Right Atrium: Right atrial size was normal in size.  Pericardium: There is no evidence of pericardial effusion.  Mitral Valve: The mitral valve is normal in structure. No evidence of mitral valve regurgitation. No evidence of mitral valve stenosis.  Tricuspid Valve: The tricuspid valve is normal in structure. Tricuspid valve regurgitation is not demonstrated. No evidence of tricuspid stenosis.  Aortic Valve: The aortic valve is tricuspid. Aortic valve regurgitation is not visualized. No aortic stenosis is present.  Pulmonic Valve: The pulmonic valve was normal in structure. Pulmonic valve regurgitation is not visualized. No evidence of pulmonic stenosis.  Aorta: Aortic dilatation noted.  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  Venous: The inferior vena cava is normal in size with greater than 50% respiratory variability, suggesting right atrial pressure of 3 mmHg.  IAS/Shunts: No atrial level shunt detected by color flow Doppler.   LEFT VENTRICLE PLAX 2D LVIDd:         4.50 cm   Diastology LVIDs:         3.00 cm   LV e' medial:    7.62 cm/s LV PW:         1.00 cm   LV E/e' medial:  9.6 LV IVS:        0.90 cm   LV e' lateral:   8.38 cm/s LVOT diam:     2.20 cm   LV E/e' lateral: 8.8 LV SV:         76 LV SV Index:   42 LVOT Area:     3.80 cm  3D Volume EF: 3D EF:        58 % LV EDV:       104 ml LV ESV:       44 ml LV SV:        61 ml  RIGHT VENTRICLE RV Basal diam:  2.60 cm RV S prime:     14.00 cm/s TAPSE (M-mode): 2.0 cm  LEFT ATRIUM             Index        RIGHT ATRIUM          Index LA diam:        3.60 cm 2.00 cm/m   RA Area:     9.96 cm LA Vol (A2C):   39.8 ml 22.13 ml/m  RA Volume:   16.20 ml 9.01 ml/m LA Vol (A4C):   32.2 ml 17.91 ml/m LA Biplane Vol: 36.7 ml 20.41 ml/m AORTIC VALVE LVOT Vmax:   101.00 cm/s LVOT Vmean:  66.200 cm/s LVOT VTI:    0.199 m  AORTA Ao Root diam: 3.10 cm Ao Asc diam:  3.70 cm  MITRAL VALVE MV Area (PHT):              SHUNTS MV Decel Time:              Systemic VTI:  0.20 m MV E velocity: 73.40 cm/s   Systemic Diam: 2.20 cm MV A velocity: 116.00 cm/s MV E/A ratio:  0.63  Chilton Si MD Electronically signed by Chilton Si MD Signature Date/Time: 09/29/2022/12:40:43 AM    Final     CT SCANS  CT CARDIAC SCORING (SELF PAY ONLY) 06/01/2020  Addendum 06/01/2020  3:57 PM ADDENDUM REPORT: 06/01/2020 15:54  CLINICAL DATA:  Risk stratification  EXAM: Coronary Calcium Score  TECHNIQUE:  The patient was scanned on a Siemens Somatom 64 slice scanner. Axial non-contrast 3 mm slices were carried out through the heart. The data set was analyzed on a dedicated work  station and scored using the Agatson method.  FINDINGS: Non-cardiac: See separate report from [facility].  Ascending aorta: Normal size; aortic atherosclerosis noted  Pericardium: Normal  Coronary arteries: Normal origin  IMPRESSION: Coronary calcium score of 2813. This was 82 percentile for age and sex matched control.  Olga Millers   Electronically Signed By: Olga Millers M.D. On: 06/01/2020 15:54  Narrative EXAM: OVER-READ INTERPRETATION  CT CHEST  The following report is an over-read performed by radiologist Dr. Trudie Reed of [facility] on 06/01/2020. This over-read does not include interpretation of cardiac or coronary anatomy or pathology. The coronary calcium score interpretation by the cardiologist is attached.  COMPARISON:  None.  FINDINGS: Within the visualized portions of the thorax there are no suspicious appearing pulmonary nodules or masses, there is no acute consolidative airspace disease, no pleural effusions, no pneumothorax and no lymphadenopathy. Visualized portions of the upper abdomen are unremarkable. There are no aggressive appearing lytic or blastic lesions noted in the visualized portions of the skeleton.  IMPRESSION: No significant incidental noncardiac findings are noted.  Electronically Signed: By: Trudie Reed M.D. On: 06/01/2020 14:49         Recent Labs: 10/30/2022: ALT 31; BUN 13; Creatinine, Ser 0.92; Potassium 4.1; Sodium 139 12/27/2022: Hemoglobin 14.9; Platelets 325  Recent Lipid Panel    Component Value Date/Time   CHOL 120 10/30/2022 1014   TRIG 98 10/30/2022 1014   HDL 43 10/30/2022 1014   CHOLHDL 2.8 10/30/2022 1014   LDLCALC 58 10/30/2022 1014    History of Present Illness    62 year old female with a history with the above past medical history including CAD s/p PTCA/DES-mid LAD, DES-mid RCA in 08/2022, bilateral carotid artery stenosis, hypertension, hyperlipidemia,  hypothyroidism, renal artery stenosis, and GERD.  Renal Dopplers in September 2020 showed 1 to 59% bilateral renal artery stenosis.  Coronary calcium score in November 2021 was 2813, 99th percentile.  Cardiac catheterization in February 2024 showed subtotal occlusion of the mid LAD, severe stenosis in the mid RCA s/p DES-LAD and RCA.  LVEDP was normal. Echocardiogram in March 2024 showed normal LV function, G1 DD.  Carotid Dopplers in March 2024 showed 1 to 39% B ICA stenosis. She was last seen in the office on 12/27/2022 and was stable from a cardiac standpoint.  She denied symptoms concerning for angina. She contacted our office on 05/01/2023 with concern for decreased energy, increased shortness of breath with activity.  She presents today for follow-up.  Since her last visit she has been stable from a cardiac standpoint up until recently. Over the past several months, particularly over the last month, she has noted increased dyspnea on exertion, significant fatigue.  She reports that her current symptoms are identical to those prior to her stents.  She is concerned.  Home Medications    Current Outpatient Medications  Medication Sig Dispense Refill   ADDERALL XR 30 MG 24 hr capsule Take 30 mg by mouth daily.     ALPRAZolam (XANAX) 0.5 MG tablet Take 0.5 mg by mouth at bedtime as needed for anxiety.     amLODipine (NORVASC) 10 MG tablet Take 1 tablet (10 mg total) by mouth daily. 90 tablet 3   aspirin 81 MG tablet Take 81 mg by mouth daily.     Brimonidine  Tartrate (LUMIFY) 0.025 % SOLN Place 1 drop into both eyes 4 (four) times daily as needed (dry eyes).     buPROPion (WELLBUTRIN XL) 300 MG 24 hr tablet Take 300 mg by mouth every morning.     clobetasol (TEMOVATE) 0.05 % external solution Apply 1 Application topically 2 (two) times daily as needed (irritation).     clopidogrel (PLAVIX) 75 MG tablet Take 1 tablet (75 mg total) by mouth daily. 90 tablet 3   desvenlafaxine (PRISTIQ) 100 MG 24 hr  tablet Take 100 mg by mouth every morning.     levothyroxine (SYNTHROID) 112 MCG tablet Take 112 mcg by mouth daily before breakfast.     losartan (COZAAR) 100 MG tablet Take 100 mg by mouth daily.     MAGNESIUM GLYCINATE PO Take 420 mg by mouth daily.     Melatonin 10 MG CHEW Chew 10 mg by mouth at bedtime.     nitroGLYCERIN (NITROSTAT) 0.4 MG SL tablet Place 1 tablet (0.4 mg total) under the tongue every 5 (five) minutes as needed. 25 tablet 2   Potassium Chloride ER 20 MEQ TBCR TAKE 1 TABLET BY MOUTH TWO  TIMES DAILY 30 tablet 0   rosuvastatin (CRESTOR) 40 MG tablet TAKE 1 TABLET DAILY (SCHEDULE AN APPOINTMENT FOR FURTHER REFILLS) 90 tablet 3   triamterene-hydrochlorothiazide (MAXZIDE) 75-50 MG tablet Take 1 tablet by mouth daily. Restart this after FU with PCP     valACYclovir (VALTREX) 500 MG tablet Take 500 mg by mouth daily as needed (for outbreaks).      cholecalciferol (VITAMIN D3) 25 MCG (1000 UNIT) tablet Take 1,000 Units by mouth daily. (Patient not taking: Reported on 05/03/2023)     cyanocobalamin (VITAMIN B12) 1000 MCG tablet Take 3,000 mcg by mouth daily. (Patient not taking: Reported on 05/03/2023)     No current facility-administered medications for this visit.     Review of Systems    She denies chest pain, palpitations, pnd, orthopnea, n, v, dizziness, syncope, edema, weight gain, or early satiety. All other systems reviewed and are otherwise negative except as noted above.   Physical Exam    VS:  BP 116/72   Pulse 96   Ht 5' 3.5" (1.613 m)   Wt 158 lb (71.7 kg)   SpO2 96%   BMI 27.55 kg/m   GEN: Well nourished, well developed, in no acute distress. HEENT: normal. Neck: Supple, no JVD, carotid bruits, or masses. Cardiac: RRR, no murmurs, rubs, or gallops. No clubbing, cyanosis, edema.  Radials/DP/PT 2+ and equal bilaterally.  Respiratory:  Respirations regular and unlabored, clear to auscultation bilaterally. GI: Soft, nontender, nondistended, BS + x 4. MS: no  deformity or atrophy. Skin: warm and dry, no rash. Neuro:  Strength and sensation are intact. Psych: Normal affect.  Accessory Clinical Findings    ECG personally reviewed by me today - EKG Interpretation Date/Time:  Thursday May 03 2023 15:01:23 EDT Ventricular Rate:  84 PR Interval:  152 QRS Duration:  80 QT Interval:  372 QTC Calculation: 439 R Axis:   -31  Text Interpretation: Normal sinus rhythm T wave abnormality No significant change was found Confirmed by Bernadene Person ([id number]) on 05/03/2023 3:08:16 PM  - no acute changes.   Lab Results  Component Value Date   WBC 9.0 12/27/2022   HGB 14.9 12/27/2022   HCT 42.1 12/27/2022   MCV 87 12/27/2022   PLT 325 12/27/2022   Lab Results  Component Value Date   CREATININE 0.92 10/30/2022  BUN 13 10/30/2022   NA 139 10/30/2022   K 4.1 10/30/2022   CL 102 10/30/2022   CO2 18 (L) 10/30/2022   Lab Results  Component Value Date   ALT 31 10/30/2022   AST 31 10/30/2022   ALKPHOS 76 10/30/2022   BILITOT 0.5 10/30/2022   Lab Results  Component Value Date   CHOL 120 10/30/2022   HDL 43 10/30/2022   LDLCALC 58 10/30/2022   TRIG 98 10/30/2022   CHOLHDL 2.8 10/30/2022    Lab Results  Component Value Date   HGBA1C 5.8 (H) 01/21/2019    Assessment & Plan    1. CAD/dyspnea on exertion: S/p PTCA/DES-mid LAD, DES-mid RCA in 08/2022.  Cheree Ditto in 09/2022 was essentially normal.  Over the past several months, particularly over the last month, has noted increased dyspnea on exertion, significant fatigue.  She reports that her current symptoms are identical to those prior to her stents.  Reviewed with Dr. Jens Som, who agrees that she would likely benefit from repeat cardiac catheterization given symptoms.  Patient is agreeable.  Will update CBC, BMET today.  Cardiac catheterization is scheduled for 05/08/2023 with Dr. [place].  Reviewed ED precautions.  Continue aspirin, Plavix, losartan, amlodipine, triamterene-HCTZ, and  Crestor.  Informed Consent   Shared Decision Making/Informed Consent The risks [stroke (1 in 1000), death (1 in 1000), kidney failure [usually temporary] (1 in 500), bleeding (1 in 200), allergic reaction [possibly serious] (1 in 200)], benefits (diagnostic support and management of coronary artery disease) and alternatives of a cardiac catheterization were discussed in detail with Ms. Baranek and she is willing to proceed.     2. Bilateral carotid artery stenosis: Carotid Dopplers in March 2024 showed 1 to 39% B ICA stenosis.  Asymptomatic.  Consider repeat study as clinically indicated.   3. Hypertension: BP well controlled. Continue current antihypertensive regimen.   4. Hyperlipidemia: LDL was 58 in 10/2022.  Continue Crestor.  5. Hypothyroidism: Follows with endocrinology, on Synthroid.  6. RAS: Renal Dopplers in September 2020 showed 1 to 59% bilateral renal artery stenosis. Follows with nephrology.   7. Disposition: Follow-up 2 weeks post cath.       Joylene Grapes, NP 05/03/2023, 3:08 PM

## 2023-05-03 NOTE — H&P (View-Only) (Signed)
Office Visit    Patient Name: Kathryn [name] Date of Encounter: 05/03/2023  Primary Care Provider:  April Manson, NP Primary Cardiologist:  Olga Millers, MD  Chief Complaint    62 year old female with a history of CAD s/p PTCA/DES-mid LAD, DES-mid RCA in 08/2022, bilateral carotid artery stenosis, hypertension, hyperlipidemia, hypothyroidism, renal artery stenosis, and GERD who presents for follow-up related to CAD and shortness of breath.  Past Medical History    Past Medical History:  Diagnosis Date   AKI (acute kidney injury) (HCC) 01/21/2019   Anxiety    GERD (gastroesophageal reflux disease)    Graves disease    with hyperthyroidism diagnosed at age 12 (treated with propylthiouracil for 2 years)   Hyperlipidemia    Hypertension    Hypothyroidism    Sleep disturbance    Past Surgical History:  Procedure Laterality Date   CARPAL TUNNEL RELEASE     CESAREAN SECTION     CORONARY STENT INTERVENTION N/A 09/11/2022   Procedure: CORONARY STENT INTERVENTION;  Surgeon: Tonny Bollman, MD;  Location: [facility] INVASIVE CV LAB;  Service: Cardiovascular;  Laterality: N/A;   LEFT HEART CATH AND CORONARY ANGIOGRAPHY N/A 09/11/2022   Procedure: LEFT HEART CATH AND CORONARY ANGIOGRAPHY;  Surgeon: Tonny Bollman, MD;  Location: [facility] INVASIVE CV LAB;  Service: Cardiovascular;  Laterality: N/A;   Rt ovary and fallopian tube removed due to cysts      Allergies  Allergies  Allergen Reactions   Hydrocortisone Rash   Flagyl [Metronidazole] Nausea Only    Nausea and Headache   Tetracyclines & Related Nausea And Vomiting     Labs/Other Studies Reviewed    The following studies were reviewed today:  Cardiac Studies & Procedures   CARDIAC CATHETERIZATION  CARDIAC CATHETERIZATION 09/11/2022  Narrative 1.  Severe near subtotal occlusion of the mid LAD with heavy calcification, lesion treated successfully with PTCA and stenting using a 3.0 x 16 mm Synergy DES, reducing 99% stenosis to  0% post PCI. 2.  Severe mid RCA stenosis, treated successfully with a 3.5 x 16 mm Synergy DES 3.  Mild nonobstructive plaquing in the left main and left circumflex appropriate for medical therapy 4.  Normal LVEDP  Recommendations: Aggressive risk reduction measures, DAPT with aspirin and clopidogrel minimum 6 months, favor 12 months if tolerated due to complexity of CAD, same-day PCI discharge if criteria met.  Findings Coronary Findings Diagnostic  Dominance: Right  Left Anterior Descending Mid LAD lesion is 99% stenosed. The lesion is severely calcified. Mid LAD to Dist LAD lesion is 70% stenosed. The lesion is calcified. LAD is small caliber through this area  Left Circumflex Mid Cx to Dist Cx lesion is 40% stenosed.  Right Coronary Artery Mid RCA lesion is 75% stenosed. The lesion is calcified.  Intervention  Mid LAD lesion Stent CATH [place]ISTA GUIDE 6FR XBLAD3.5 guide catheter was inserted. Lesion crossed with guidewire using a WIRE COUGAR XT STRL 190CM. Pre-stent angioplasty was performed using a BALLOON TAKERU 2.0X12. Maximum pressure:  12 atm. A drug-eluting stent was successfully placed using a SYNERGY XD 3.0X16. Post-stent angioplasty was performed using a BALLN [place] EMERGE MR [id number]. Maximum pressure:  18 atm. Initially, the 2.0 mm balloon is used to predilate the lesion, then a 3.0 noncompliant balloon is used to predilate the lesion to make sure that we would get good stent expansion.  Both balloons inflated fully.  The lesion is stented and postdilated without complication. Post-Intervention Lesion Assessment The intervention was successful. Pre-interventional TIMI  flow is 2. Post-intervention TIMI flow is 3. No complications occurred at this lesion. There is a 0% residual stenosis post intervention.  Mid RCA lesion Stent CATH LAUNCHER 6FR JR4 guide catheter was inserted. Lesion crossed with guidewire using a WIRE COUGAR XT STRL 190CM. Pre-stent angioplasty was performed  using a BALL SAPPHIRE NC24 3.0X12. A drug-eluting stent was successfully placed using a SYNERGY XD 3.50X16. Post-stent angioplasty was performed using a BALLN [place] EUPHORA RX [id number]. Maximum pressure:  18 atm. Post-Intervention Lesion Assessment The intervention was successful. Pre-interventional TIMI flow is 3. Post-intervention TIMI flow is 3. No complications occurred at this lesion. There is a 0% residual stenosis post intervention.   STRESS TESTS  MYOCARDIAL PERFUSION IMAGING 03/27/2019  Narrative  There was no ST segment deviation noted during stress.  Nuclear stress EF: 56%.  The left ventricular ejection fraction is normal (55-65%).  Defect 1: There is a small fixed defect of mild severity present in the apical lateral location. Artifact vs small infarct. Suspect artifact, as normal wall motion.  The study is normal.  This is a low risk study.   ECHOCARDIOGRAM  ECHOCARDIOGRAM COMPLETE 09/28/2022  Narrative ECHOCARDIOGRAM REPORT    Patient Name:   Kathryn [name] Date of Exam: 09/28/2022 Medical Rec #:  [id number]      Height:       64.0 in Accession #:    [id number]     Weight:       164.0 lb Date of Birth:  03/24/1961       BSA:          1.798 m Patient Age:    61 years       BP:           154/91 mmHg Patient Gender: F              HR:           73 bpm. Exam Location:  [address]  Procedure: 2D Echo, 3D Echo, Cardiac Doppler and Color Doppler  Indications:    I25.10 CAD  History:        Patient has prior history of Echocardiogram examinations, most recent 01/22/2019. Carotid Disease; Risk Factors:Hypertension and HLD.  Sonographer:    Clearence Ped RCS Referring Phys: [id number] BRIAN S CRENSHAW  IMPRESSIONS   1. Left ventricular ejection fraction, by estimation, is 60 to 65%. The left ventricle has normal function. The left ventricle has no regional wall motion abnormalities. Left ventricular diastolic parameters are consistent with Grade I diastolic dysfunction  (impaired relaxation). 2. Right ventricular systolic function is normal. The right ventricular size is normal. 3. The mitral valve is normal in structure. No evidence of mitral valve regurgitation. No evidence of mitral stenosis. 4. The aortic valve is tricuspid. Aortic valve regurgitation is not visualized. No aortic stenosis is present. 5. Aortic dilatation noted. 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 6. The inferior vena cava is normal in size with greater than 50% respiratory variability, suggesting right atrial pressure of 3 mmHg.  FINDINGS Left Ventricle: Left ventricular ejection fraction, by estimation, is 60 to 65%. The left ventricle has normal function. The left ventricle has no regional wall motion abnormalities. The left ventricular internal cavity size was normal in size. There is no left ventricular hypertrophy. Left ventricular diastolic parameters are consistent with Grade I diastolic dysfunction (impaired relaxation). Normal left ventricular filling pressure.  Right Ventricle: The right ventricular size is normal. No increase in right ventricular  wall thickness. Right ventricular systolic function is normal.  Left Atrium: Left atrial size was normal in size.  Right Atrium: Right atrial size was normal in size.  Pericardium: There is no evidence of pericardial effusion.  Mitral Valve: The mitral valve is normal in structure. No evidence of mitral valve regurgitation. No evidence of mitral valve stenosis.  Tricuspid Valve: The tricuspid valve is normal in structure. Tricuspid valve regurgitation is not demonstrated. No evidence of tricuspid stenosis.  Aortic Valve: The aortic valve is tricuspid. Aortic valve regurgitation is not visualized. No aortic stenosis is present.  Pulmonic Valve: The pulmonic valve was normal in structure. Pulmonic valve regurgitation is not visualized. No evidence of pulmonic stenosis.  Aorta: Aortic dilatation noted.  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  Venous: The inferior vena cava is normal in size with greater than 50% respiratory variability, suggesting right atrial pressure of 3 mmHg.  IAS/Shunts: No atrial level shunt detected by color flow Doppler.   LEFT VENTRICLE PLAX 2D LVIDd:         4.50 cm   Diastology LVIDs:         3.00 cm   LV e' medial:    7.62 cm/s LV PW:         1.00 cm   LV E/e' medial:  9.6 LV IVS:        0.90 cm   LV e' lateral:   8.38 cm/s LVOT diam:     2.20 cm   LV E/e' lateral: 8.8 LV SV:         76 LV SV Index:   42 LVOT Area:     3.80 cm  3D Volume EF: 3D EF:        58 % LV EDV:       104 ml LV ESV:       44 ml LV SV:        61 ml  RIGHT VENTRICLE RV Basal diam:  2.60 cm RV S prime:     14.00 cm/s TAPSE (M-mode): 2.0 cm  LEFT ATRIUM             Index        RIGHT ATRIUM          Index LA diam:        3.60 cm 2.00 cm/m   RA Area:     9.96 cm LA Vol (A2C):   39.8 ml 22.13 ml/m  RA Volume:   16.20 ml 9.01 ml/m LA Vol (A4C):   32.2 ml 17.91 ml/m LA Biplane Vol: 36.7 ml 20.41 ml/m AORTIC VALVE LVOT Vmax:   101.00 cm/s LVOT Vmean:  66.200 cm/s LVOT VTI:    0.199 m  AORTA Ao Root diam: 3.10 cm Ao Asc diam:  3.70 cm  MITRAL VALVE MV Area (PHT):              SHUNTS MV Decel Time:              Systemic VTI:  0.20 m MV E velocity: 73.40 cm/s   Systemic Diam: 2.20 cm MV A velocity: 116.00 cm/s MV E/A ratio:  0.63  Chilton Si MD Electronically signed by Chilton Si MD Signature Date/Time: 09/29/2022/12:40:43 AM    Final     CT SCANS  CT CARDIAC SCORING (SELF PAY ONLY) 06/01/2020  Addendum 06/01/2020  3:57 PM ADDENDUM REPORT: 06/01/2020 15:54  CLINICAL DATA:  Risk stratification  EXAM: Coronary Calcium Score  TECHNIQUE:  The patient was scanned on a Siemens Somatom 64 slice scanner. Axial non-contrast 3 mm slices were carried out through the heart. The data set was analyzed on a dedicated work  station and scored using the Agatson method.  FINDINGS: Non-cardiac: See separate report from [facility].  Ascending aorta: Normal size; aortic atherosclerosis noted  Pericardium: Normal  Coronary arteries: Normal origin  IMPRESSION: Coronary calcium score of 2813. This was 82 percentile for age and sex matched control.  Olga Millers   Electronically Signed By: Olga Millers M.D. On: 06/01/2020 15:54  Narrative EXAM: OVER-READ INTERPRETATION  CT CHEST  The following report is an over-read performed by radiologist Dr. Trudie Reed of [facility] on 06/01/2020. This over-read does not include interpretation of cardiac or coronary anatomy or pathology. The coronary calcium score interpretation by the cardiologist is attached.  COMPARISON:  None.  FINDINGS: Within the visualized portions of the thorax there are no suspicious appearing pulmonary nodules or masses, there is no acute consolidative airspace disease, no pleural effusions, no pneumothorax and no lymphadenopathy. Visualized portions of the upper abdomen are unremarkable. There are no aggressive appearing lytic or blastic lesions noted in the visualized portions of the skeleton.  IMPRESSION: No significant incidental noncardiac findings are noted.  Electronically Signed: By: Trudie Reed M.D. On: 06/01/2020 14:49         Recent Labs: 10/30/2022: ALT 31; BUN 13; Creatinine, Ser 0.92; Potassium 4.1; Sodium 139 12/27/2022: Hemoglobin 14.9; Platelets 325  Recent Lipid Panel    Component Value Date/Time   CHOL 120 10/30/2022 1014   TRIG 98 10/30/2022 1014   HDL 43 10/30/2022 1014   CHOLHDL 2.8 10/30/2022 1014   LDLCALC 58 10/30/2022 1014    History of Present Illness    62 year old female with a history with the above past medical history including CAD s/p PTCA/DES-mid LAD, DES-mid RCA in 08/2022, bilateral carotid artery stenosis, hypertension, hyperlipidemia,  hypothyroidism, renal artery stenosis, and GERD.  Renal Dopplers in September 2020 showed 1 to 59% bilateral renal artery stenosis.  Coronary calcium score in November 2021 was 2813, 99th percentile.  Cardiac catheterization in February 2024 showed subtotal occlusion of the mid LAD, severe stenosis in the mid RCA s/p DES-LAD and RCA.  LVEDP was normal. Echocardiogram in March 2024 showed normal LV function, G1 DD.  Carotid Dopplers in March 2024 showed 1 to 39% B ICA stenosis. She was last seen in the office on 12/27/2022 and was stable from a cardiac standpoint.  She denied symptoms concerning for angina. She contacted our office on 05/01/2023 with concern for decreased energy, increased shortness of breath with activity.  She presents today for follow-up.  Since her last visit she has been stable from a cardiac standpoint up until recently. Over the past several months, particularly over the last month, she has noted increased dyspnea on exertion, significant fatigue.  She reports that her current symptoms are identical to those prior to her stents.  She is concerned.  Home Medications    Current Outpatient Medications  Medication Sig Dispense Refill   ADDERALL XR 30 MG 24 hr capsule Take 30 mg by mouth daily.     ALPRAZolam (XANAX) 0.5 MG tablet Take 0.5 mg by mouth at bedtime as needed for anxiety.     amLODipine (NORVASC) 10 MG tablet Take 1 tablet (10 mg total) by mouth daily. 90 tablet 3   aspirin 81 MG tablet Take 81 mg by mouth daily.     Brimonidine  Tartrate (LUMIFY) 0.025 % SOLN Place 1 drop into both eyes 4 (four) times daily as needed (dry eyes).     buPROPion (WELLBUTRIN XL) 300 MG 24 hr tablet Take 300 mg by mouth every morning.     clobetasol (TEMOVATE) 0.05 % external solution Apply 1 Application topically 2 (two) times daily as needed (irritation).     clopidogrel (PLAVIX) 75 MG tablet Take 1 tablet (75 mg total) by mouth daily. 90 tablet 3   desvenlafaxine (PRISTIQ) 100 MG 24 hr  tablet Take 100 mg by mouth every morning.     levothyroxine (SYNTHROID) 112 MCG tablet Take 112 mcg by mouth daily before breakfast.     losartan (COZAAR) 100 MG tablet Take 100 mg by mouth daily.     MAGNESIUM GLYCINATE PO Take 420 mg by mouth daily.     Melatonin 10 MG CHEW Chew 10 mg by mouth at bedtime.     nitroGLYCERIN (NITROSTAT) 0.4 MG SL tablet Place 1 tablet (0.4 mg total) under the tongue every 5 (five) minutes as needed. 25 tablet 2   Potassium Chloride ER 20 MEQ TBCR TAKE 1 TABLET BY MOUTH TWO  TIMES DAILY 30 tablet 0   rosuvastatin (CRESTOR) 40 MG tablet TAKE 1 TABLET DAILY (SCHEDULE AN APPOINTMENT FOR FURTHER REFILLS) 90 tablet 3   triamterene-hydrochlorothiazide (MAXZIDE) 75-50 MG tablet Take 1 tablet by mouth daily. Restart this after FU with PCP     valACYclovir (VALTREX) 500 MG tablet Take 500 mg by mouth daily as needed (for outbreaks).      cholecalciferol (VITAMIN D3) 25 MCG (1000 UNIT) tablet Take 1,000 Units by mouth daily. (Patient not taking: Reported on 05/03/2023)     cyanocobalamin (VITAMIN B12) 1000 MCG tablet Take 3,000 mcg by mouth daily. (Patient not taking: Reported on 05/03/2023)     No current facility-administered medications for this visit.     Review of Systems    She denies chest pain, palpitations, pnd, orthopnea, n, v, dizziness, syncope, edema, weight gain, or early satiety. All other systems reviewed and are otherwise negative except as noted above.   Physical Exam    VS:  BP 116/72   Pulse 96   Ht 5' 3.5" (1.613 m)   Wt 158 lb (71.7 kg)   SpO2 96%   BMI 27.55 kg/m   GEN: Well nourished, well developed, in no acute distress. HEENT: normal. Neck: Supple, no JVD, carotid bruits, or masses. Cardiac: RRR, no murmurs, rubs, or gallops. No clubbing, cyanosis, edema.  Radials/DP/PT 2+ and equal bilaterally.  Respiratory:  Respirations regular and unlabored, clear to auscultation bilaterally. GI: Soft, nontender, nondistended, BS + x 4. MS: no  deformity or atrophy. Skin: warm and dry, no rash. Neuro:  Strength and sensation are intact. Psych: Normal affect.  Accessory Clinical Findings    ECG personally reviewed by me today - EKG Interpretation Date/Time:  Thursday May 03 2023 15:01:23 EDT Ventricular Rate:  84 PR Interval:  152 QRS Duration:  80 QT Interval:  372 QTC Calculation: 439 R Axis:   -31  Text Interpretation: Normal sinus rhythm T wave abnormality No significant change was found Confirmed by Bernadene Person ([id number]) on 05/03/2023 3:08:16 PM  - no acute changes.   Lab Results  Component Value Date   WBC 9.0 12/27/2022   HGB 14.9 12/27/2022   HCT 42.1 12/27/2022   MCV 87 12/27/2022   PLT 325 12/27/2022   Lab Results  Component Value Date   CREATININE 0.92 10/30/2022  BUN 13 10/30/2022   NA 139 10/30/2022   K 4.1 10/30/2022   CL 102 10/30/2022   CO2 18 (L) 10/30/2022   Lab Results  Component Value Date   ALT 31 10/30/2022   AST 31 10/30/2022   ALKPHOS 76 10/30/2022   BILITOT 0.5 10/30/2022   Lab Results  Component Value Date   CHOL 120 10/30/2022   HDL 43 10/30/2022   LDLCALC 58 10/30/2022   TRIG 98 10/30/2022   CHOLHDL 2.8 10/30/2022    Lab Results  Component Value Date   HGBA1C 5.8 (H) 01/21/2019    Assessment & Plan    1. CAD/dyspnea on exertion: S/p PTCA/DES-mid LAD, DES-mid RCA in 08/2022.  Cheree Ditto in 09/2022 was essentially normal.  Over the past several months, particularly over the last month, has noted increased dyspnea on exertion, significant fatigue.  She reports that her current symptoms are identical to those prior to her stents.  Reviewed with Dr. Jens Som, who agrees that she would likely benefit from repeat cardiac catheterization given symptoms.  Patient is agreeable.  Will update CBC, BMET today.  Cardiac catheterization is scheduled for 05/08/2023 with Dr. [place].  Reviewed ED precautions.  Continue aspirin, Plavix, losartan, amlodipine, triamterene-HCTZ, and  Crestor.  Informed Consent   Shared Decision Making/Informed Consent The risks [stroke (1 in 1000), death (1 in 1000), kidney failure [usually temporary] (1 in 500), bleeding (1 in 200), allergic reaction [possibly serious] (1 in 200)], benefits (diagnostic support and management of coronary artery disease) and alternatives of a cardiac catheterization were discussed in detail with Ms. Baranek and she is willing to proceed.     2. Bilateral carotid artery stenosis: Carotid Dopplers in March 2024 showed 1 to 39% B ICA stenosis.  Asymptomatic.  Consider repeat study as clinically indicated.   3. Hypertension: BP well controlled. Continue current antihypertensive regimen.   4. Hyperlipidemia: LDL was 58 in 10/2022.  Continue Crestor.  5. Hypothyroidism: Follows with endocrinology, on Synthroid.  6. RAS: Renal Dopplers in September 2020 showed 1 to 59% bilateral renal artery stenosis. Follows with nephrology.   7. Disposition: Follow-up 2 weeks post cath.       Joylene Grapes, NP 05/03/2023, 3:08 PM

## 2023-05-03 NOTE — Patient Instructions (Signed)
Medication Instructions:  Your physician recommends that you continue on your current medications as directed. Please refer to the Current Medication list given to you today.  *If you need a refill on your cardiac medications before your next appointment, please call your pharmacy*   Lab Work: BMET & CBC today  Testing/Procedures: Your physician has requested that you have a cardiac catheterization. Cardiac catheterization is used to diagnose and/or treat various heart conditions. Doctors may recommend this procedure for a number of different reasons. The most common reason is to evaluate chest pain. Chest pain can be a symptom of coronary artery disease (CAD), and cardiac catheterization can show whether plaque is narrowing or blocking your heart's arteries. This procedure is also used to evaluate the valves, as well as measure the blood flow and oxygen levels in different parts of your heart. For further information please visit [email or web address] Please follow instruction sheet, as given.    Follow-Up: At [facility], you and your health needs are our priority.  As part of our continuing mission to provide you with exceptional heart care, we have created designated Provider Care Teams.  These Care Teams include your primary Cardiologist (physician) and Advanced Practice Providers (APPs -  Physician Assistants and Nurse Practitioners) who all work together to provide you with the care you need, when you need it.  We recommend signing up for the patient portal called "MyChart".  Sign up information is provided on this After Visit Summary.  MyChart is used to connect with patients for Virtual Visits (Telemedicine).  Patients are able to view lab/test results, encounter notes, upcoming appointments, etc.  Non-urgent messages can be sent to your provider as well.   To learn more about what you can do with MyChart, go to [email or web address].    Your next appointment:   2 week post  cath   Provider:   Bernadene Person, NP        Other Instructions  [facility] A DEPT OF [place]. [facility] AT [facility] [address] [place] [address] Dept: [phone] Loc: [phone]  Kathryn [name]  05/03/2023  You are scheduled for a Cardiac Catheterization on Tuesday, October 22 with Dr. Peter [name].  1. Please arrive at the [facility] (Main Entrance A) at [facility]: [address] [place], [place] [address] at 7:00 AM (This time is 2 hour(s) before your procedure to ensure your preparation). Free valet parking service is available. You will check in at ADMITTING. The support [name] will be asked to wait in the waiting room.  It is OK to have someone drop you off and come back when you are ready to be discharged.    Special note: Every effort is made to have your procedure done on time. Please understand that emergencies sometimes delay scheduled procedures.  2. Diet: Do not eat solid foods after midnight.  The patient may have clear liquids until 5am upon the day of the procedure.  3. Labs:lab work today  4. Medication instructions in preparation for your procedure:   Contrast Allergy: No   Current Outpatient Medications (Endocrine & Metabolic):    levothyroxine (SYNTHROID) 112 MCG tablet, Take 112 mcg by mouth daily before breakfast.  Current Outpatient Medications (Cardiovascular):    amLODipine (NORVASC) 10 MG tablet, Take 1 tablet (10 mg total) by mouth daily.   losartan (COZAAR) 100 MG tablet, Take 100 mg by mouth daily.   nitroGLYCERIN (NITROSTAT) 0.4 MG  SL tablet, Place 1 tablet (0.4 mg total) under the tongue every 5 (five) minutes as needed.   rosuvastatin (CRESTOR) 40 MG tablet, TAKE 1 TABLET DAILY (SCHEDULE AN APPOINTMENT FOR FURTHER REFILLS)   triamterene-hydrochlorothiazide (MAXZIDE) 75-50 MG tablet, Take 1 tablet by mouth daily. Restart this after FU with PCP   Current  Outpatient Medications (Analgesics):    aspirin 81 MG tablet, Take 81 mg by mouth daily.  Current Outpatient Medications (Hematological):    clopidogrel (PLAVIX) 75 MG tablet, Take 1 tablet (75 mg total) by mouth daily.   cyanocobalamin (VITAMIN B12) 1000 MCG tablet, Take 3,000 mcg by mouth daily. (Patient not taking: Reported on 05/03/2023)  Current Outpatient Medications (Other):    ADDERALL XR 30 MG 24 hr capsule, Take 30 mg by mouth daily.   ALPRAZolam (XANAX) 0.5 MG tablet, Take 0.5 mg by mouth at bedtime as needed for anxiety.   Brimonidine Tartrate (LUMIFY) 0.025 % SOLN, Place 1 drop into both eyes 4 (four) times daily as needed (dry eyes).   buPROPion (WELLBUTRIN XL) 300 MG 24 hr tablet, Take 300 mg by mouth every morning.   clobetasol (TEMOVATE) 0.05 % external solution, Apply 1 Application topically 2 (two) times daily as needed (irritation).   desvenlafaxine (PRISTIQ) 100 MG 24 hr tablet, Take 100 mg by mouth every morning.   MAGNESIUM GLYCINATE PO, Take 420 mg by mouth daily.   Melatonin 10 MG CHEW, Chew 10 mg by mouth at bedtime.   Potassium Chloride ER 20 MEQ TBCR, TAKE 1 TABLET BY MOUTH TWO  TIMES DAILY   valACYclovir (VALTREX) 500 MG tablet, Take 500 mg by mouth daily as needed (for outbreaks).    cholecalciferol (VITAMIN D3) 25 MCG (1000 UNIT) tablet, Take 1,000 Units by mouth daily. (Patient not taking: Reported on 05/03/2023) *For reference purposes while preparing patient instructions.   Delete this med list prior to printing instructions for patient.*   On the morning of your procedure, take your Aspirin 81 mg and any morning medicines NOT listed above.  You may use sips of water.  5. Plan to go home the same day, you will only stay overnight if medically necessary. 6. Bring a current list of your medications and current insurance cards. 7. You MUST have a responsible [name] to drive you home. 8. Someone MUST be with you the first 24 hours after you arrive home or  your discharge will be delayed. 9. Please wear clothes that are easy to get on and off and wear slip-on shoes.  Thank you for allowing [place] to care for you!   -- [facility]

## 2023-05-04 LAB — CBC
Hematocrit: 46.2 % (ref 34.0–46.6)
Hemoglobin: 15.8 g/dL (ref 11.1–15.9)
MCH: 30.8 pg (ref 26.6–33.0)
MCHC: 34.2 g/dL (ref 31.5–35.7)
MCV: 90 fL (ref 79–97)
Platelets: 323 10*3/uL (ref 150–450)
RBC: 5.13 x10E6/uL (ref 3.77–5.28)
RDW: 12.5 % (ref 11.7–15.4)
WBC: 8.8 10*3/uL (ref 3.4–10.8)

## 2023-05-04 LAB — BASIC METABOLIC PANEL
BUN/Creatinine Ratio: 21 (ref 12–28)
BUN: 21 mg/dL (ref 8–27)
CO2: 23 mmol/L (ref 20–29)
Calcium: 10.1 mg/dL (ref 8.7–10.3)
Chloride: 101 mmol/L (ref 96–106)
Creatinine, Ser: 0.98 mg/dL (ref 0.57–1.00)
Glucose: 111 mg/dL — ABNORMAL HIGH (ref 70–99)
Potassium: 4.7 mmol/L (ref 3.5–5.2)
Sodium: 141 mmol/L (ref 134–144)
eGFR: 65 mL/min/{1.73_m2} (ref >59)

## 2023-05-07 ENCOUNTER — Telehealth: Payer: Self-pay | Admitting: *Deleted

## 2023-05-07 NOTE — Plan of Care (Signed)
[facility] Tonsillectomy/Adenoidectomy, Postoperative PEDS care plan entered in error.

## 2023-05-07 NOTE — Telephone Encounter (Signed)
Cardiac Catheterization scheduled at [facility] for: Tuesday May 08, 2023 9 AM Arrival time [facility] Main Entrance A at: 7 AM  Nothing to eat after midnight prior to procedure, clear liquids until 5 AM day of procedure.  Medication instructions: -Hold:  Triamterene/HCT/KCl-AM of procedure -Other usual morning medications can be taken with sips of water including aspirin 81 mg and Plavix 75 mg.   Plan to go home the same day, you will only stay overnight if medically necessary.  You must have responsible adult to drive you home.  Someone must be with you the first 24 hours after you arrive home.  Reviewed procedure instructions with patient.

## 2023-05-08 ENCOUNTER — Encounter (HOSPITAL_COMMUNITY)
Admission: RE | Disposition: A | Discharge status: Home / Self Care | Payer: Self-pay | Source: Home / Self Care | Attending: Cardiology

## 2023-05-08 ENCOUNTER — Ambulatory Visit (HOSPITAL_COMMUNITY)
Admission: RE | Admit: 2023-05-08 | Discharge: 2023-05-08 | Disposition: A | Discharge status: Home / Self Care | Payer: Commercial Managed Care - HMO | Attending: Cardiology | Admitting: Cardiology

## 2023-05-08 ENCOUNTER — Other Ambulatory Visit: Payer: Self-pay

## 2023-05-08 DIAGNOSIS — K219 Gastro-esophageal reflux disease without esophagitis: Secondary | ICD-10-CM | POA: Insufficient documentation

## 2023-05-08 DIAGNOSIS — I10 Essential (primary) hypertension: Secondary | ICD-10-CM | POA: Diagnosis not present

## 2023-05-08 DIAGNOSIS — E785 Hyperlipidemia, unspecified: Secondary | ICD-10-CM | POA: Insufficient documentation

## 2023-05-08 DIAGNOSIS — Z955 Presence of coronary angioplasty implant and graft: Secondary | ICD-10-CM | POA: Insufficient documentation

## 2023-05-08 DIAGNOSIS — I2510 Atherosclerotic heart disease of native coronary artery without angina pectoris: Secondary | ICD-10-CM | POA: Insufficient documentation

## 2023-05-08 DIAGNOSIS — Z7902 Long term (current) use of antithrombotics/antiplatelets: Secondary | ICD-10-CM | POA: Diagnosis not present

## 2023-05-08 DIAGNOSIS — I701 Atherosclerosis of renal artery: Secondary | ICD-10-CM | POA: Diagnosis not present

## 2023-05-08 DIAGNOSIS — Z79890 Hormone replacement therapy (postmenopausal): Secondary | ICD-10-CM | POA: Insufficient documentation

## 2023-05-08 DIAGNOSIS — E039 Hypothyroidism, unspecified: Secondary | ICD-10-CM | POA: Insufficient documentation

## 2023-05-08 DIAGNOSIS — Z7982 Long term (current) use of aspirin: Secondary | ICD-10-CM | POA: Diagnosis not present

## 2023-05-08 DIAGNOSIS — R5383 Other fatigue: Secondary | ICD-10-CM | POA: Diagnosis not present

## 2023-05-08 DIAGNOSIS — R0602 Shortness of breath: Secondary | ICD-10-CM | POA: Insufficient documentation

## 2023-05-08 DIAGNOSIS — I2584 Coronary atherosclerosis due to calcified coronary lesion: Secondary | ICD-10-CM | POA: Diagnosis not present

## 2023-05-08 DIAGNOSIS — I25118 Atherosclerotic heart disease of native coronary artery with other forms of angina pectoris: Secondary | ICD-10-CM

## 2023-05-08 DIAGNOSIS — I6523 Occlusion and stenosis of bilateral carotid arteries: Secondary | ICD-10-CM | POA: Insufficient documentation

## 2023-05-08 DIAGNOSIS — Z79899 Other long term (current) drug therapy: Secondary | ICD-10-CM | POA: Insufficient documentation

## 2023-05-08 DIAGNOSIS — R0609 Other forms of dyspnea: Secondary | ICD-10-CM | POA: Insufficient documentation

## 2023-05-08 HISTORY — PX: LEFT HEART CATH AND CORONARY ANGIOGRAPHY: CATH118249

## 2023-05-08 SURGERY — LEFT HEART CATH AND CORONARY ANGIOGRAPHY
Anesthesia: LOCAL

## 2023-05-08 MED ORDER — ONDANSETRON HCL 4 MG/2ML IJ SOLN: 4.0000 mg | Freq: Four times a day (QID) | INTRAMUSCULAR | Status: DC | PRN

## 2023-05-08 MED ORDER — VERAPAMIL HCL 2.5 MG/ML IV SOLN
INTRAVENOUS | Status: AC
  Filled 2023-05-08: qty 2

## 2023-05-08 MED ORDER — MIDAZOLAM HCL 2 MG/2ML IJ SOLN
INTRAMUSCULAR | Status: DC | PRN
  Administered 2023-05-08: 1 mg via INTRAVENOUS

## 2023-05-08 MED ORDER — HEPARIN SODIUM (PORCINE) 1000 UNIT/ML IJ SOLN
INTRAMUSCULAR | Status: DC | PRN
  Administered 2023-05-08: 3500 [IU] via INTRAVENOUS

## 2023-05-08 MED ORDER — SODIUM CHLORIDE 0.9 % IV SOLN: 250.0000 mL | INTRAVENOUS | Status: DC | PRN

## 2023-05-08 MED ORDER — IOHEXOL 350 MG/ML SOLN
INTRAVENOUS | Status: DC | PRN
  Administered 2023-05-08: 45 mL

## 2023-05-08 MED ORDER — SODIUM CHLORIDE 0.9% FLUSH: 3.0000 mL | Freq: Two times a day (BID) | INTRAVENOUS | Status: DC

## 2023-05-08 MED ORDER — SODIUM CHLORIDE 0.9 % WEIGHT BASED INFUSION: 1.0000 mL/kg/h | INTRAVENOUS | Status: DC

## 2023-05-08 MED ORDER — VERAPAMIL HCL 2.5 MG/ML IV SOLN
INTRAVENOUS | Status: DC | PRN
  Administered 2023-05-08: 10 mL via INTRA_ARTERIAL

## 2023-05-08 MED ORDER — HYDRALAZINE HCL 20 MG/ML IJ SOLN: 10.0000 mg | INTRAMUSCULAR | Status: DC | PRN

## 2023-05-08 MED ORDER — SODIUM CHLORIDE 0.9% FLUSH
3.0000 mL | INTRAVENOUS | Status: DC | PRN
Start: 2023-05-08 — End: 2023-05-08

## 2023-05-08 MED ORDER — FENTANYL CITRATE (PF) 100 MCG/2ML IJ SOLN
INTRAMUSCULAR | Status: DC | PRN
  Administered 2023-05-08: 25 ug via INTRAVENOUS

## 2023-05-08 MED ORDER — FENTANYL CITRATE (PF) 100 MCG/2ML IJ SOLN
INTRAMUSCULAR | Status: AC
  Filled 2023-05-08: qty 2

## 2023-05-08 MED ORDER — MIDAZOLAM HCL 2 MG/2ML IJ SOLN
INTRAMUSCULAR | Status: AC
  Filled 2023-05-08: qty 2

## 2023-05-08 MED ORDER — LIDOCAINE HCL (PF) 1 % IJ SOLN
INTRAMUSCULAR | Status: AC
  Filled 2023-05-08: qty 30

## 2023-05-08 MED ORDER — LIDOCAINE HCL (PF) 1 % IJ SOLN
INTRAMUSCULAR | Status: DC | PRN
  Administered 2023-05-08: 2 mL

## 2023-05-08 MED ORDER — SODIUM CHLORIDE 0.9 % WEIGHT BASED INFUSION
3.0000 mL/kg/h | INTRAVENOUS | Status: AC
  Administered 2023-05-08: 3 mL/kg/h via INTRAVENOUS

## 2023-05-08 MED ORDER — HEPARIN (PORCINE) IN NACL 1000-0.9 UT/500ML-% IV SOLN
INTRAVENOUS | Status: DC | PRN
  Administered 2023-05-08 (×2): 500 mL

## 2023-05-08 MED ORDER — ACETAMINOPHEN 325 MG PO TABS: 650.0000 mg | ORAL_TABLET | ORAL | Status: DC | PRN

## 2023-05-08 MED ORDER — HEPARIN SODIUM (PORCINE) 1000 UNIT/ML IJ SOLN
INTRAMUSCULAR | Status: AC
  Filled 2023-05-08: qty 10

## 2023-05-08 MED ORDER — ASPIRIN 81 MG PO CHEW: 81.0000 mg | CHEWABLE_TABLET | ORAL | Status: DC

## 2023-05-08 SURGICAL SUPPLY — 8 items
CATH 5FR JL3.5 JR4 ANG PIG MP (CATHETERS) IMPLANT
DEVICE RAD COMP TR BAND LRG (VASCULAR PRODUCTS) IMPLANT
GLIDESHEATH SLEND SS 6F .021 (SHEATH) IMPLANT
GUIDEWIRE INQWIRE 1.5J.035X260 (WIRE) IMPLANT
INQWIRE 1.5J .035X260CM (WIRE) ×1
PACK CARDIAC CATHETERIZATION (CUSTOM PROCEDURE TRAY) ×1 IMPLANT
SET ATX-X65L (MISCELLANEOUS) IMPLANT
SHEATH PROBE COVER 6X72 (BAG) IMPLANT

## 2023-05-08 NOTE — Discharge Instructions (Addendum)

## 2023-05-08 NOTE — Plan of Care (Signed)
[facility] Tonsillectomy/Adenoidectomy, Postoperative PEDS care plan entered in error.

## 2023-05-08 NOTE — Interval H&P Note (Signed)
History and Physical Interval Note:  05/08/2023 8:18 AM  Kathryn [name]  has presented today for surgery, with the diagnosis of DOE, CAD.  The various methods of treatment have been discussed with the patient and family. After consideration of risks, benefits and other options for treatment, the patient has consented to  Procedure(s): LEFT HEART CATH AND CORONARY ANGIOGRAPHY (N/A) as a surgical intervention.  The patient's history has been reviewed, patient examined, no change in status, stable for surgery.  I have reviewed the patient's chart and labs.  Questions were answered to the patient's satisfaction.   Cath Lab Visit (complete for each Cath Lab visit)  Clinical Evaluation Leading to the Procedure:   ACS: No.  Non-ACS:    Anginal Classification: CCS III  Anti-ischemic medical therapy: Minimal Therapy (1 class of medications)  Non-Invasive Test Results: No non-invasive testing performed  Prior CABG: No previous CABG        Theron Arista [facility] 05/08/2023 8:19 AM

## 2023-05-09 ENCOUNTER — Encounter (HOSPITAL_COMMUNITY): Payer: Self-pay | Admitting: Cardiology

## 2023-05-24 ENCOUNTER — Ambulatory Visit: Payer: Managed Care, Other (non HMO) | Attending: Nurse Practitioner | Admitting: Nurse Practitioner

## 2023-05-24 ENCOUNTER — Encounter: Payer: Self-pay | Admitting: Nurse Practitioner

## 2023-05-24 VITALS — BP 118/76 | HR 74 | Ht 63.0 in | Wt 158.4 lb

## 2023-05-24 DIAGNOSIS — R0609 Other forms of dyspnea: Secondary | ICD-10-CM | POA: Diagnosis not present

## 2023-05-24 DIAGNOSIS — I701 Atherosclerosis of renal artery: Secondary | ICD-10-CM

## 2023-05-24 DIAGNOSIS — I2510 Atherosclerotic heart disease of native coronary artery without angina pectoris: Secondary | ICD-10-CM

## 2023-05-24 DIAGNOSIS — R5382 Chronic fatigue, unspecified: Secondary | ICD-10-CM

## 2023-05-24 DIAGNOSIS — I6523 Occlusion and stenosis of bilateral carotid arteries: Secondary | ICD-10-CM | POA: Diagnosis not present

## 2023-05-24 DIAGNOSIS — E785 Hyperlipidemia, unspecified: Secondary | ICD-10-CM

## 2023-05-24 DIAGNOSIS — I10 Essential (primary) hypertension: Secondary | ICD-10-CM

## 2023-05-24 DIAGNOSIS — E039 Hypothyroidism, unspecified: Secondary | ICD-10-CM

## 2023-05-24 NOTE — Progress Notes (Signed)
Office Visit    Patient Name: Kathryn [name] Date of Encounter: 05/24/2023  Primary Care Provider:  April Manson, NP Primary Cardiologist:  Olga Millers, MD  Chief Complaint    62 year old female with a history of CAD s/p PTCA/DES-mid LAD, DES-mid RCA in 08/2022, bilateral carotid artery stenosis, hypertension, hyperlipidemia, hypothyroidism, renal artery stenosis, and GERD who presents for follow-up related to CAD.   Past Medical History    Past Medical History:  Diagnosis Date   AKI (acute kidney injury) (HCC) 01/21/2019   Anxiety    GERD (gastroesophageal reflux disease)    Graves disease    with hyperthyroidism diagnosed at age 89 (treated with propylthiouracil for 2 years)   Hyperlipidemia    Hypertension    Hypothyroidism    Sleep disturbance    Past Surgical History:  Procedure Laterality Date   CARPAL TUNNEL RELEASE     CESAREAN SECTION     CORONARY STENT INTERVENTION N/A 09/11/2022   Procedure: CORONARY STENT INTERVENTION;  Surgeon: Tonny Bollman, MD;  Location: [facility] INVASIVE CV LAB;  Service: Cardiovascular;  Laterality: N/A;   LEFT HEART CATH AND CORONARY ANGIOGRAPHY N/A 09/11/2022   Procedure: LEFT HEART CATH AND CORONARY ANGIOGRAPHY;  Surgeon: Tonny Bollman, MD;  Location: [facility] INVASIVE CV LAB;  Service: Cardiovascular;  Laterality: N/A;   LEFT HEART CATH AND CORONARY ANGIOGRAPHY N/A 05/08/2023   Procedure: LEFT HEART CATH AND CORONARY ANGIOGRAPHY;  Surgeon: [place], Peter M, MD;  Location: [facility] INVASIVE CV LAB;  Service: Cardiovascular;  Laterality: N/A;   Rt ovary and fallopian tube removed due to cysts      Allergies  Allergies  Allergen Reactions   Hydrocortisone Rash   Flagyl [Metronidazole] Nausea Only    Nausea and Headache   Tetracyclines & Related Nausea And Vomiting     Labs/Other Studies Reviewed    The following studies were reviewed today:  Cardiac Studies & Procedures   CARDIAC CATHETERIZATION  CARDIAC CATHETERIZATION  05/08/2023  Narrative Nonobstructive CAD. Continue excellent patency of LAD and RCA stents Normal LV function Normal LVEDP  Plan: continue medical therapy  Findings Coronary Findings Diagnostic  Dominance: Right  Left Anterior Descending Non-stenotic Mid LAD lesion was previously treated. The lesion is severely calcified. Mid LAD to Dist LAD lesion is 55% stenosed. The lesion is calcified. LAD is small caliber through this area  Left Circumflex Mid Cx to Dist Cx lesion is 50% stenosed.  Right Coronary Artery Non-stenotic Mid RCA lesion was previously treated. The lesion is calcified.  Intervention  No interventions have been documented.   CARDIAC CATHETERIZATION  CARDIAC CATHETERIZATION 09/11/2022  Narrative 1.  Severe near subtotal occlusion of the mid LAD with heavy calcification, lesion treated successfully with PTCA and stenting using a 3.0 x 16 mm Synergy DES, reducing 99% stenosis to 0% post PCI. 2.  Severe mid RCA stenosis, treated successfully with a 3.5 x 16 mm Synergy DES 3.  Mild nonobstructive plaquing in the left main and left circumflex appropriate for medical therapy 4.  Normal LVEDP  Recommendations: Aggressive risk reduction measures, DAPT with aspirin and clopidogrel minimum 6 months, favor 12 months if tolerated due to complexity of CAD, same-day PCI discharge if criteria met.  Findings Coronary Findings Diagnostic  Dominance: Right  Left Anterior Descending Mid LAD lesion is 99% stenosed. The lesion is severely calcified. Mid LAD to Dist LAD lesion is 70% stenosed. The lesion is calcified. LAD is small caliber through this area  Left Circumflex Mid Cx to  Dist Cx lesion is 40% stenosed.  Right Coronary Artery Mid RCA lesion is 75% stenosed. The lesion is calcified.  Intervention  Mid LAD lesion Stent CATH [place]ISTA GUIDE 6FR XBLAD3.5 guide catheter was inserted. Lesion crossed with guidewire using a WIRE COUGAR XT STRL 190CM. Pre-stent  angioplasty was performed using a BALLOON TAKERU 2.0X12. Maximum pressure:  12 atm. A drug-eluting stent was successfully placed using a SYNERGY XD 3.0X16. Post-stent angioplasty was performed using a BALLN [place] EMERGE MR [id number]. Maximum pressure:  18 atm. Initially, the 2.0 mm balloon is used to predilate the lesion, then a 3.0 noncompliant balloon is used to predilate the lesion to make sure that we would get good stent expansion.  Both balloons inflated fully.  The lesion is stented and postdilated without complication. Post-Intervention Lesion Assessment The intervention was successful. Pre-interventional TIMI flow is 2. Post-intervention TIMI flow is 3. No complications occurred at this lesion. There is a 0% residual stenosis post intervention.  Mid RCA lesion Stent CATH LAUNCHER 6FR JR4 guide catheter was inserted. Lesion crossed with guidewire using a WIRE COUGAR XT STRL 190CM. Pre-stent angioplasty was performed using a BALL SAPPHIRE NC24 3.0X12. A drug-eluting stent was successfully placed using a SYNERGY XD 3.50X16. Post-stent angioplasty was performed using a BALLN [place] EUPHORA RX [id number]. Maximum pressure:  18 atm. Post-Intervention Lesion Assessment The intervention was successful. Pre-interventional TIMI flow is 3. Post-intervention TIMI flow is 3. No complications occurred at this lesion. There is a 0% residual stenosis post intervention.   STRESS TESTS  MYOCARDIAL PERFUSION IMAGING 03/27/2019  Narrative  There was no ST segment deviation noted during stress.  Nuclear stress EF: 56%.  The left ventricular ejection fraction is normal (55-65%).  Defect 1: There is a small fixed defect of mild severity present in the apical lateral location. Artifact vs small infarct. Suspect artifact, as normal wall motion.  The study is normal.  This is a low risk study.   ECHOCARDIOGRAM  ECHOCARDIOGRAM COMPLETE 09/28/2022  Narrative ECHOCARDIOGRAM REPORT    Patient Name:   Kathryn [name] Date of Exam: 09/28/2022 Medical Rec #:  [id number]      Height:       64.0 in Accession #:    [id number]     Weight:       164.0 lb Date of Birth:  04-17-1961       BSA:          1.798 m Patient Age:    61 years       BP:           154/91 mmHg Patient Gender: F              HR:           73 bpm. Exam Location:  [address]  Procedure: 2D Echo, 3D Echo, Cardiac Doppler and Color Doppler  Indications:    I25.10 CAD  History:        Patient has prior history of Echocardiogram examinations, most recent 01/22/2019. Carotid Disease; Risk Factors:Hypertension and HLD.  Sonographer:    Clearence Ped RCS Referring Phys: [id number] BRIAN S CRENSHAW  IMPRESSIONS   1. Left ventricular ejection fraction, by estimation, is 60 to 65%. The left ventricle has normal function. The left ventricle has no regional wall motion abnormalities. Left ventricular diastolic parameters are consistent with Grade I diastolic dysfunction (impaired relaxation). 2. Right ventricular systolic function is normal. The right ventricular size is normal. 3. The mitral valve is normal in  structure. No evidence of mitral valve regurgitation. No evidence of mitral stenosis. 4. The aortic valve is tricuspid. Aortic valve regurgitation is not visualized. No aortic stenosis is present. 5. Aortic dilatation noted. 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 6. The inferior vena cava is normal in size with greater than 50% respiratory variability, suggesting right atrial pressure of 3 mmHg.  FINDINGS Left Ventricle: Left ventricular ejection fraction, by estimation, is 60 to 65%. The left ventricle has normal function. The left ventricle has no regional wall motion abnormalities. The left ventricular internal cavity size was normal in size. There is no left ventricular hypertrophy. Left ventricular diastolic parameters are consistent with Grade I diastolic dysfunction (impaired relaxation). Normal left  ventricular filling pressure.  Right Ventricle: The right ventricular size is normal. No increase in right ventricular wall thickness. Right ventricular systolic function is normal.  Left Atrium: Left atrial size was normal in size.  Right Atrium: Right atrial size was normal in size.  Pericardium: There is no evidence of pericardial effusion.  Mitral Valve: The mitral valve is normal in structure. No evidence of mitral valve regurgitation. No evidence of mitral valve stenosis.  Tricuspid Valve: The tricuspid valve is normal in structure. Tricuspid valve regurgitation is not demonstrated. No evidence of tricuspid stenosis.  Aortic Valve: The aortic valve is tricuspid. Aortic valve regurgitation is not visualized. No aortic stenosis is present.  Pulmonic Valve: The pulmonic valve was normal in structure. Pulmonic valve regurgitation is not visualized. No evidence of pulmonic stenosis.  Aorta: Aortic dilatation noted. 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  Venous: The inferior vena cava is normal in size with greater than 50% respiratory variability, suggesting right atrial pressure of 3 mmHg.  IAS/Shunts: No atrial level shunt detected by color flow Doppler.   LEFT VENTRICLE PLAX 2D LVIDd:         4.50 cm   Diastology LVIDs:         3.00 cm   LV e' medial:    7.62 cm/s LV PW:         1.00 cm   LV E/e' medial:  9.6 LV IVS:        0.90 cm   LV e' lateral:   8.38 cm/s LVOT diam:     2.20 cm   LV E/e' lateral: 8.8 LV SV:         76 LV SV Index:   42 LVOT Area:     3.80 cm  3D Volume EF: 3D EF:        58 % LV EDV:       104 ml LV ESV:       44 ml LV SV:        61 ml  RIGHT VENTRICLE RV Basal diam:  2.60 cm RV S prime:     14.00 cm/s TAPSE (M-mode): 2.0 cm  LEFT ATRIUM             Index        RIGHT ATRIUM          Index LA diam:        3.60 cm 2.00 cm/m   RA Area:     9.96 cm LA Vol (A2C):   39.8 ml 22.13 ml/m  RA Volume:   16.20 ml 9.01  ml/m LA Vol (A4C):   32.2 ml 17.91 ml/m LA Biplane Vol: 36.7 ml 20.41 ml/m AORTIC VALVE LVOT Vmax:  101.00 cm/s LVOT Vmean:  66.200 cm/s LVOT VTI:    0.199 m  AORTA Ao Root diam: 3.10 cm Ao Asc diam:  3.70 cm  MITRAL VALVE MV Area (PHT):              SHUNTS MV Decel Time:              Systemic VTI:  0.20 m MV E velocity: 73.40 cm/s   Systemic Diam: 2.20 cm MV A velocity: 116.00 cm/s MV E/A ratio:  0.63  Chilton Si MD Electronically signed by Chilton Si MD Signature Date/Time: 09/29/2022/12:40:43 AM    Final     CT SCANS  CT CARDIAC SCORING (SELF PAY ONLY) 06/01/2020  Addendum 06/01/2020  3:57 PM ADDENDUM REPORT: 06/01/2020 15:54  CLINICAL DATA:  Risk stratification  EXAM: Coronary Calcium Score  TECHNIQUE: The patient was scanned on a Siemens Somatom 64 slice scanner. Axial non-contrast 3 mm slices were carried out through the heart. The data set was analyzed on a dedicated work station and scored using the Agatson method.  FINDINGS: Non-cardiac: See separate report from [facility].  Ascending aorta: Normal size; aortic atherosclerosis noted  Pericardium: Normal  Coronary arteries: Normal origin  IMPRESSION: Coronary calcium score of 2813. This was 4 percentile for age and sex matched control.  Olga Millers   Electronically Signed By: Olga Millers M.D. On: 06/01/2020 15:54  Narrative EXAM: OVER-READ INTERPRETATION  CT CHEST  The following report is an over-read performed by radiologist Dr. Trudie Reed of [facility] on 06/01/2020. This over-read does not include interpretation of cardiac or coronary anatomy or pathology. The coronary calcium score interpretation by the cardiologist is attached.  COMPARISON:  None.  FINDINGS: Within the visualized portions of the thorax there are no suspicious appearing pulmonary nodules or masses, there is no acute consolidative airspace disease, no pleural  effusions, no pneumothorax and no lymphadenopathy. Visualized portions of the upper abdomen are unremarkable. There are no aggressive appearing lytic or blastic lesions noted in the visualized portions of the skeleton.  IMPRESSION: No significant incidental noncardiac findings are noted.  Electronically Signed: By: Trudie Reed M.D. On: 06/01/2020 14:49         Recent Labs: 10/30/2022: ALT 31 05/03/2023: BUN 21; Creatinine, Ser 0.98; Hemoglobin 15.8; Platelets 323; Potassium 4.7; Sodium 141  Recent Lipid Panel    Component Value Date/Time   CHOL 120 10/30/2022 1014   TRIG 98 10/30/2022 1014   HDL 43 10/30/2022 1014   CHOLHDL 2.8 10/30/2022 1014   LDLCALC 58 10/30/2022 1014    History of Present Illness    62 year old female with a history with the above past medical history including CAD s/p PTCA/DES-mid LAD, DES-mid RCA in 08/2022, bilateral carotid artery stenosis, hypertension, hyperlipidemia, hypothyroidism, renal artery stenosis, and GERD.   Renal Dopplers in September 2020 showed 1 to 59% bilateral renal artery stenosis.  Coronary calcium score in November 2021 was 2813, 99th percentile.  Cardiac catheterization in February 2024 showed subtotal occlusion of the mid LAD, severe stenosis in the mid RCA s/p DES-LAD and RCA.  LVEDP was normal. Echocardiogram in March 2024 showed normal LV function, G1 DD.  Carotid Dopplers in March 2024 showed 1 to 39% B ICA stenosis. She was last seen in the office on 05/03/2023 and noted increased dyspnea on exertion, fatigue, similar to prior anginal equivalent.  She underwent repeat cardiac catheterization on 05/08/2023 which revealed nonobstructive CAD, continued excellent patency of LAD and RCA stents, normal LV  function, normal LVEDP.  Ongoing medical therapy was recommended.   She presents today for follow-up.  Since her last visit she has been stable from a cardiac standpoint, though she continues to note significant fatigue, dyspnea on  exertion, and chest heaviness. Additionally, she notes balance issues, and mild memory impairment.  She is questioning whether or not these could be symptoms of long COVID.  Upon further questioning, her symptoms of fatigue, balance issues, and mild memory impairment have been ongoing for years (since around the time she transitioned to Crestor).  She denies any palpitations, dizziness, presyncope, syncope, edema, PND, orthopnea, weight gain.  Overall, her symptoms are unchanged from prior visit.   Home Medications    Current Outpatient Medications  Medication Sig Dispense Refill   ALPRAZolam (XANAX) 0.5 MG tablet Take 0.5 mg by mouth at bedtime as needed for anxiety.     amLODipine (NORVASC) 10 MG tablet Take 1 tablet (10 mg total) by mouth daily. 90 tablet 3   amphetamine-dextroamphetamine (ADDERALL XR) 10 MG 24 hr capsule Take 10 mg by mouth daily as needed (ADHD).     aspirin 81 MG tablet Take 81 mg by mouth daily.     Brimonidine Tartrate (LUMIFY) 0.025 % SOLN Place 1 drop into both eyes 4 (four) times daily as needed (dry eyes).     buPROPion (WELLBUTRIN XL) 300 MG 24 hr tablet Take 300 mg by mouth every morning.     cholecalciferol (VITAMIN D3) 25 MCG (1000 UNIT) tablet Take 1,000 Units by mouth daily.     clobetasol (TEMOVATE) 0.05 % external solution Apply 1 Application topically 2 (two) times daily as needed (irritation).     clopidogrel (PLAVIX) 75 MG tablet Take 1 tablet (75 mg total) by mouth daily. 90 tablet 3   cyanocobalamin (VITAMIN B12) 1000 MCG tablet Take 3,000 mcg by mouth daily.     desvenlafaxine (PRISTIQ) 100 MG 24 hr tablet Take 100 mg by mouth every morning.     levothyroxine (SYNTHROID) 112 MCG tablet Take 112 mcg by mouth daily before breakfast.     losartan (COZAAR) 100 MG tablet Take 100 mg by mouth daily.     MAGNESIUM GLYCINATE PO Take 420 mg by mouth at bedtime.     Melatonin 5 MG CHEW Chew 10 mg by mouth at bedtime. Gummy     Multiple Vitamin (MULTIVITAMIN)  capsule Take 1 capsule by mouth daily.     nitroGLYCERIN (NITROSTAT) 0.4 MG SL tablet Place 1 tablet (0.4 mg total) under the tongue every 5 (five) minutes as needed. 25 tablet 2   Potassium Chloride ER 20 MEQ TBCR TAKE 1 TABLET BY MOUTH TWO  TIMES DAILY 30 tablet 0   rosuvastatin (CRESTOR) 40 MG tablet TAKE 1 TABLET DAILY (SCHEDULE AN APPOINTMENT FOR FURTHER REFILLS) 90 tablet 3   triamterene-hydrochlorothiazide (MAXZIDE) 75-50 MG tablet Take 1 tablet by mouth daily. Restart this after FU with PCP     valACYclovir (VALTREX) 500 MG tablet Take 500 mg by mouth daily as needed (for outbreaks).      No current facility-administered medications for this visit.     Review of Systems    She denies chest pain, palpitations, pnd, orthopnea, n, v, dizziness, syncope, edema, weight gain, or early satiety. All other systems reviewed and are otherwise negative except as noted above.   Physical Exam    VS:  BP 118/76 (BP Location: Left Arm, Patient Position: Sitting, Cuff Size: Normal)   Pulse 74   Ht 5'  3" (1.6 m)   Wt 158 lb 6.4 oz (71.8 kg)   SpO2 96%   BMI 28.06 kg/m  GEN: Well nourished, well developed, in no acute distress. HEENT: normal. Neck: Supple, no JVD, carotid bruits, or masses. Cardiac: RRR, no murmurs, rubs, or gallops. No clubbing, cyanosis, edema. Radials/DP/PT 2+ and equal bilaterally. R radial cath site without bruising, bleeding, bruit or hematoma.  Respiratory:  Respirations regular and unlabored, clear to auscultation bilaterally. GI: Soft, nontender, nondistended, BS + x 4. MS: no deformity or atrophy. Skin: warm and dry, no rash. Neuro:  Strength and sensation are intact. Psych: Normal affect.  Accessory Clinical Findings    ECG personally reviewed by me today - EKG Interpretation Date/Time:  Thursday May 24 2023 14:15:42 EST Ventricular Rate:  74 PR Interval:  152 QRS Duration:  82 QT Interval:  400 QTC Calculation: 444 R Axis:   -23  Text  Interpretation: Normal sinus rhythm Low voltage QRS T wave abnormality No significant change was found Confirmed by Bernadene Person ([id number]) on 05/24/2023 2:18:59 PM  - no acute changes.   Lab Results  Component Value Date   WBC 8.8 05/03/2023   HGB 15.8 05/03/2023   HCT 46.2 05/03/2023   MCV 90 05/03/2023   PLT 323 05/03/2023   Lab Results  Component Value Date   CREATININE 0.98 05/03/2023   BUN 21 05/03/2023   NA 141 05/03/2023   K 4.7 05/03/2023   CL 101 05/03/2023   CO2 23 05/03/2023   Lab Results  Component Value Date   ALT 31 10/30/2022   AST 31 10/30/2022   ALKPHOS 76 10/30/2022   BILITOT 0.5 10/30/2022   Lab Results  Component Value Date   CHOL 120 10/30/2022   HDL 43 10/30/2022   LDLCALC 58 10/30/2022   TRIG 98 10/30/2022   CHOLHDL 2.8 10/30/2022    Lab Results  Component Value Date   HGBA1C 5.8 (H) 01/21/2019    Assessment & Plan    1. CAD/dyspnea on exertion/fatigue: S/p PTCA/DES-mid LAD, DES-mid RCA in 08/2022.  Echo in 09/2022 was essentially normal.  At her last office visit she noted a 1 month history of increased dyspnea on exertion, significant fatigue. Repeat cardiac catheterization on 05/08/2023 revealed nonobstructive CAD, continued excellent patency of LAD and RCA stents, normal LV function, normal LVEDP.  Ongoing medical therapy was recommended. She continues to note significant fatigue, dyspnea on exertion, and chest heaviness. Additionally, she notes balance issues, and mild memory impairment.  Recent cath/echo reassuring. She is questioning whether or not these could be symptoms of long COVID.  Upon further questioning, her symptoms of fatigue, balance issues, and mild memory impairment have been ongoing for years.  Thyroid function was checked recently and was normal.  We discussed possible etiology of symptoms including adverse reaction to statin therapy, possible long COVID (patient has had COVID 3 times).  Will trial 2-week statin holiday. If symptoms  improve, will plan to refer to lipid clinic Pharm.D. for consideration of PCSK9 inhibitor.  If no improvement in symptoms, will resume statin.  If dyspnea on exertion persists, could consider pulmonology referral.  Continue to monitor symptoms. Continue aspirin, Plavix, losartan, amlodipine, triamterene-HCTZ, and Crestor as above.   2. Bilateral carotid artery stenosis: Carotid Dopplers in March 2024 showed 1 to 39% B ICA stenosis.  Asymptomatic.  Consider repeat study as clinically indicated.    3. Hypertension: BP well controlled. Continue current antihypertensive regimen.    4. Hyperlipidemia: LDL was 58  in 10/2022.  Continue Crestor as above.    5. Hypothyroidism: Follows with endocrinology, on Synthroid.   6. RAS: Renal Dopplers in September 2020 showed 1 to 59% bilateral renal artery stenosis. Follows with nephrology.    7. Disposition: Follow-up as scheduled with Dr. Jens Som in 06/2023.       Joylene Grapes, NP 05/24/2023, 6:27 PM

## 2023-05-24 NOTE — Patient Instructions (Addendum)
Medication Instructions:  Hold Rosuvastatin 40 mg for 2 weeks. Please call with an update of symptoms.   *If you need a refill on your cardiac medications before your next appointment, please call your pharmacy*   Lab Work: NONE ordered at this time of appointment     Testing/Procedures: NONE ordered at this time of appointment     Follow-Up: At [facility], you and your health needs are our priority.  As part of our continuing mission to provide you with exceptional heart care, we have created designated Provider Care Teams.  These Care Teams include your primary Cardiologist (physician) and Advanced Practice Providers (APPs -  Physician Assistants and Nurse Practitioners) who all work together to provide you with the care you need, when you need it.  We recommend signing up for the patient portal called "MyChart".  Sign up information is provided on this After Visit Summary.  MyChart is used to connect with patients for Virtual Visits (Telemedicine).  Patients are able to view lab/test results, encounter notes, upcoming appointments, etc.  Non-urgent messages can be sent to your provider as well.   To learn more about what you can do with MyChart, go to [email or web address].    Your next appointment:    Keep follow up   Provider:   Olga Millers, MD

## 2023-07-03 NOTE — Progress Notes (Signed)
HPI: FU hypertension and CAD. Renal Dopplers September 2020 showed 1 to 59% bilateral renal artery stenosis. Calcium score November 2021 2813 which was 99th percentile. Cardiac catheterization February 2024 showed subtotal occlusion of the mid LAD, severe stenosis in the mid right coronary artery and normal left ventricular end-diastolic pressure.  Patient had PCI of the LAD and right coronary artery with drug-eluting stents.  Echocardiogram March 2024 showed normal LV function, grade 1 diastolic dysfunction.  Carotid Dopplers March 2024 showed 1 to 39% bilateral stenosis.  Repeat catheterization October 2024 due to recurrent dyspnea showed nonobstructive coronary disease with patency of the LAD and RCA stents, normal LV function and normal LVEDP.  Since last seen, she has dyspnea with more vigorous activities but not routine activities.  She denies chest pain or syncope.  She feels as though she is having myalgias related to her Crestor.  Current Outpatient Medications  Medication Sig Dispense Refill   ALPRAZolam (XANAX) 0.5 MG tablet Take 0.5 mg by mouth at bedtime as needed for anxiety.     amLODipine (NORVASC) 10 MG tablet Take 1 tablet (10 mg total) by mouth daily. 90 tablet 3   amphetamine-dextroamphetamine (ADDERALL XR) 10 MG 24 hr capsule Take 10 mg by mouth daily as needed (ADHD).     aspirin 81 MG tablet Take 81 mg by mouth daily.     Brimonidine Tartrate (LUMIFY) 0.025 % SOLN Place 1 drop into both eyes 4 (four) times daily as needed (dry eyes).     buPROPion (WELLBUTRIN XL) 300 MG 24 hr tablet Take 300 mg by mouth every morning.     clobetasol (TEMOVATE) 0.05 % external solution Apply 1 Application topically 2 (two) times daily as needed (irritation).     clopidogrel (PLAVIX) 75 MG tablet Take 1 tablet (75 mg total) by mouth daily. 90 tablet 3   desvenlafaxine (PRISTIQ) 100 MG 24 hr tablet Take 100 mg by mouth every morning.     levothyroxine (SYNTHROID) 112 MCG tablet Take 112 mcg  by mouth daily before breakfast.     losartan (COZAAR) 100 MG tablet Take 100 mg by mouth daily.     MAGNESIUM GLYCINATE PO Take 420 mg by mouth at bedtime.     Melatonin 5 MG CHEW Chew 10 mg by mouth at bedtime. Gummy     nitroGLYCERIN (NITROSTAT) 0.4 MG SL tablet Place 1 tablet (0.4 mg total) under the tongue every 5 (five) minutes as needed. 25 tablet 2   Potassium Chloride ER 20 MEQ TBCR TAKE 1 TABLET BY MOUTH TWO  TIMES DAILY 30 tablet 0   rosuvastatin (CRESTOR) 40 MG tablet TAKE 1 TABLET DAILY (SCHEDULE AN APPOINTMENT FOR FURTHER REFILLS) 90 tablet 3   triamterene-hydrochlorothiazide (MAXZIDE) 75-50 MG tablet Take 1 tablet by mouth daily. Restart this after FU with PCP     valACYclovir (VALTREX) 500 MG tablet Take 500 mg by mouth daily as needed (for outbreaks).      cholecalciferol (VITAMIN D3) 25 MCG (1000 UNIT) tablet Take 1,000 Units by mouth daily.     cyanocobalamin (VITAMIN B12) 1000 MCG tablet Take 3,000 mcg by mouth daily. (Patient not taking: Reported on 07/09/2023)     Multiple Vitamin (MULTIVITAMIN) capsule Take 1 capsule by mouth daily.     No current facility-administered medications for this visit.     Past Medical History:  Diagnosis Date   AKI (acute kidney injury) (HCC) 01/21/2019   Anxiety    GERD (gastroesophageal reflux disease)  Graves disease    with hyperthyroidism diagnosed at age 70 (treated with propylthiouracil for 2 years)   Hyperlipidemia    Hypertension    Hypothyroidism    Sleep disturbance     Past Surgical History:  Procedure Laterality Date   CARPAL TUNNEL RELEASE     CESAREAN SECTION     CORONARY STENT INTERVENTION N/A 09/11/2022   Procedure: CORONARY STENT INTERVENTION;  Surgeon: Tonny Bollman, MD;  Location: [facility] INVASIVE CV LAB;  Service: Cardiovascular;  Laterality: N/A;   LEFT HEART CATH AND CORONARY ANGIOGRAPHY N/A 09/11/2022   Procedure: LEFT HEART CATH AND CORONARY ANGIOGRAPHY;  Surgeon: Tonny Bollman, MD;  Location: [facility] INVASIVE  CV LAB;  Service: Cardiovascular;  Laterality: N/A;   LEFT HEART CATH AND CORONARY ANGIOGRAPHY N/A 05/08/2023   Procedure: LEFT HEART CATH AND CORONARY ANGIOGRAPHY;  Surgeon: Swaziland, Peter M, MD;  Location: [facility] INVASIVE CV LAB;  Service: Cardiovascular;  Laterality: N/A;   Rt ovary and fallopian tube removed due to cysts      Social History   Socioeconomic History   Marital status: Single    Spouse name: Not on file   Number of children: 1   Years of education: Not on file   Highest education level: Not on file  Occupational History   Not on file  Tobacco Use   Smoking status: Never   Smokeless tobacco: Never  Vaping Use   Vaping status: Never Used  Substance and Sexual Activity   Alcohol use: Yes   Drug use: No   Sexual activity: Not on file  Other Topics Concern   Not on file  Social History Narrative   Not on file   Social Drivers of Health   Financial Resource Strain: Patient Declined (10/07/2022)   Received from [facility], [facility]   Overall Financial Resource Strain (CARDIA)    Difficulty of Paying Living Expenses: Patient declined  Food Insecurity: Patient Declined (10/07/2022)   Received from [facility], [facility]   Hunger Vital Sign    Worried About Running Out of Food in the Last Year: Patient declined    Ran Out of Food in the Last Year: Patient declined  Transportation Needs: Patient Declined (10/07/2022)   Received from [facility], [facility]   [facility] - Transportation    Lack of Transportation (Medical): Patient declined    Lack of Transportation (Non-Medical): Patient declined  Physical Activity: Unknown (10/07/2022)   Received from [facility], [facility]   Exercise Vital Sign    Days of Exercise per Week: Patient declined    Minutes of Exercise per Session: 20 min  Recent Concern: Physical Activity - Insufficiently Active (07/24/2022)   Received from [facility]   Exercise Vital Sign    Days of Exercise per Week: 2 days     Minutes of Exercise per Session: 20 min  Stress: Patient Declined (10/07/2022)   Received from [facility], [facility] of Occupational Health - Occupational Stress Questionnaire    Feeling of Stress : Patient declined  Recent Concern: Stress - Stress Concern Present (07/24/2022)   Received from [facility] of Occupational Health - Occupational Stress Questionnaire    Feeling of Stress : To some extent  Social Connections: Moderately Integrated (07/24/2022)   Received from [facility], [facility]   Social Network    How would you rate your social network (family, work, friends)?: Adequate participation with social networks  Intimate Partner Violence: Not  At Risk (07/24/2022)   Received from [facility], [facility]   HITS    Over the last 12 months how often did your partner physically hurt you?: Never    Over the last 12 months how often did your partner insult you or talk down to you?: Never    Over the last 12 months how often did your partner threaten you with physical harm?: Never    Over the last 12 months how often did your partner scream or curse at you?: Never    Family History  Problem Relation Age of Onset   Heart disease Mother        died of heart failure   Heart attack Mother 64   CAD Brother     ROS: Fatigue but no fevers or chills, productive cough, hemoptysis, dysphasia, odynophagia, melena, hematochezia, dysuria, hematuria, rash, seizure activity, orthopnea, PND, pedal edema, claudication. Remaining systems are negative.  Physical Exam: Well-developed well-nourished in no acute distress.  Skin is warm and dry.  HEENT is normal.  Neck is supple.  Chest is clear to auscultation with normal expansion.  Cardiovascular exam is regular rate and rhythm.  Abdominal exam nontender or distended. No masses palpated. Extremities show no edema. neuro grossly intact   A/P  1 coronary artery disease-patient denies chest  pain.  Will continue aspirin and Plavix (discontinue Plavix February 2025) and statin.  Recent catheterization revealed patent stents.  2 hypertension-patient's blood pressure is controlled.  Continue present medical regimen.  3 hyperlipidemia-patient is describing occasional myalgias.  I will decrease Crestor from 40 to 20 mg daily and add Zetia 10 mg daily.  Check lipids and liver in 8 weeks.  If her symptoms do not improve can consider discontinuing Crestor and treating with PCSK9 inhibitor.  4 renal insufficiency-Per nephrology.  Olga Millers, MD

## 2023-07-09 ENCOUNTER — Encounter: Payer: Self-pay | Admitting: Cardiology

## 2023-07-09 ENCOUNTER — Ambulatory Visit: Payer: Managed Care, Other (non HMO) | Attending: Cardiology | Admitting: Cardiology

## 2023-07-09 VITALS — BP 130/76 | HR 79 | Ht 63.5 in | Wt 160.0 lb

## 2023-07-09 DIAGNOSIS — I2510 Atherosclerotic heart disease of native coronary artery without angina pectoris: Secondary | ICD-10-CM

## 2023-07-09 DIAGNOSIS — E785 Hyperlipidemia, unspecified: Secondary | ICD-10-CM

## 2023-07-09 DIAGNOSIS — E7800 Pure hypercholesterolemia, unspecified: Secondary | ICD-10-CM

## 2023-07-09 DIAGNOSIS — I10 Essential (primary) hypertension: Secondary | ICD-10-CM | POA: Diagnosis not present

## 2023-07-09 MED ORDER — ROSUVASTATIN CALCIUM 20 MG PO TABS: 20.0000 mg | ORAL_TABLET | Freq: Every day | ORAL | 3 refills | Status: AC

## 2023-07-09 MED ORDER — EZETIMIBE 10 MG PO TABS: 10.0000 mg | ORAL_TABLET | Freq: Every day | ORAL | 3 refills | Status: DC

## 2023-07-09 NOTE — Patient Instructions (Signed)
Medication Instructions:   Decrease rosuvastatin to 20 mg once daily= 1/2 of the 40 mg tablet once daily  Start ezetimibe 10 mg once daily ' *If you need a refill on your cardiac medications before your next appointment, please call your pharmacy*   Lab Work:  Your physician recommends that you return for lab work in: 8 weeks-fasting  If you have labs (blood work) drawn today and your tests are completely normal, you will receive your results only by: MyChart Message (if you have MyChart) OR A paper copy in the mail If you have any lab test that is abnormal or we need to change your treatment, we will call you to review the results.   Follow-Up: At [facility], you and your health needs are our priority.  As part of our continuing mission to provide you with exceptional heart care, we have created designated Provider Care Teams.  These Care Teams include your primary Cardiologist (physician) and Advanced Practice Providers (APPs -  Physician Assistants and Nurse Practitioners) who all work together to provide you with the care you need, when you need it.  We recommend signing up for the patient portal called "MyChart".  Sign up information is provided on this After Visit Summary.  MyChart is used to connect with patients for Virtual Visits (Telemedicine).  Patients are able to view lab/test results, encounter notes, upcoming appointments, etc.  Non-urgent messages can be sent to your provider as well.   To learn more about what you can do with MyChart, go to [email or web address].    Your next appointment:   6 month(s)  Provider:   Olga Millers, MD

## 2023-08-24 LAB — LIPID PANEL
Chol/HDL Ratio: 2.3 {ratio} (ref 0.0–4.4)
Cholesterol, Total: 115 mg/dL (ref 100–199)
HDL: 50 mg/dL (ref >39)
LDL Chol Calc (NIH): 53 mg/dL (ref 0–99)
Triglycerides: 53 mg/dL (ref 0–149)
VLDL Cholesterol Cal: 12 mg/dL (ref 5–40)

## 2023-08-24 LAB — HEPATIC FUNCTION PANEL
ALT: 28 [IU]/L (ref 0–32)
AST: 28 [IU]/L (ref 0–40)
Albumin: 4.7 g/dL (ref 3.9–4.9)
Alkaline Phosphatase: 73 [IU]/L (ref 44–121)
Bilirubin Total: 0.6 mg/dL (ref 0.0–1.2)
Bilirubin, Direct: 0.21 mg/dL (ref 0.00–0.40)
Total Protein: 6.6 g/dL (ref 6.0–8.5)

## 2023-08-27 ENCOUNTER — Encounter: Payer: Self-pay | Admitting: *Deleted

## 2023-10-09 ENCOUNTER — Ambulatory Visit (INDEPENDENT_AMBULATORY_CARE_PROVIDER_SITE_OTHER): Payer: Commercial Managed Care - HMO | Admitting: Professional

## 2023-10-09 ENCOUNTER — Encounter: Payer: Self-pay | Admitting: Professional

## 2023-10-09 DIAGNOSIS — F411 Generalized anxiety disorder: Secondary | ICD-10-CM | POA: Diagnosis not present

## 2023-10-09 DIAGNOSIS — F331 Major depressive disorder, recurrent, moderate: Secondary | ICD-10-CM
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
## 2023-10-09 NOTE — Progress Notes (Signed)
   Kathryn [name], [facility]

## 2023-10-11 ENCOUNTER — Encounter: Payer: Self-pay | Admitting: Cardiology

## 2023-10-30 ENCOUNTER — Ambulatory Visit: Admitting: Professional

## 2023-10-30 ENCOUNTER — Encounter: Payer: Self-pay | Admitting: Professional

## 2023-10-30 DIAGNOSIS — F331 Major depressive disorder, recurrent, moderate: Secondary | ICD-10-CM | POA: Diagnosis not present

## 2023-10-30 DIAGNOSIS — F411 Generalized anxiety disorder: Secondary | ICD-10-CM | POA: Diagnosis not present
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
## 2023-10-30 NOTE — Progress Notes (Signed)
   Teofilo Pod, [facility]

## 2023-11-05 ENCOUNTER — Ambulatory Visit (INDEPENDENT_AMBULATORY_CARE_PROVIDER_SITE_OTHER): Admitting: Professional

## 2023-11-05 ENCOUNTER — Encounter: Payer: Self-pay | Admitting: Professional

## 2023-11-05 DIAGNOSIS — F331 Major depressive disorder, recurrent, moderate: Secondary | ICD-10-CM | POA: Diagnosis not present

## 2023-11-05 DIAGNOSIS — F411 Generalized anxiety disorder: Secondary | ICD-10-CM
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
## 2023-11-14 ENCOUNTER — Ambulatory Visit: Admitting: Professional

## 2023-11-14 ENCOUNTER — Encounter: Payer: Self-pay | Admitting: Professional

## 2023-11-14 NOTE — Progress Notes (Signed)
 A user error has taken place: encounter opened in error, closed for administrative reasons.

## 2023-11-19 ENCOUNTER — Encounter: Payer: Self-pay | Admitting: Professional

## 2023-11-19 ENCOUNTER — Ambulatory Visit (INDEPENDENT_AMBULATORY_CARE_PROVIDER_SITE_OTHER): Admitting: Professional

## 2023-11-19 DIAGNOSIS — F331 Major depressive disorder, recurrent, moderate: Secondary | ICD-10-CM | POA: Diagnosis not present

## 2023-11-19 DIAGNOSIS — F411 Generalized anxiety disorder: Secondary | ICD-10-CM | POA: Diagnosis not present
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
## 2023-11-26 ENCOUNTER — Encounter: Payer: Self-pay | Admitting: Professional

## 2023-11-26 ENCOUNTER — Ambulatory Visit (INDEPENDENT_AMBULATORY_CARE_PROVIDER_SITE_OTHER): Admitting: Professional

## 2023-11-26 DIAGNOSIS — F331 Major depressive disorder, recurrent, moderate: Secondary | ICD-10-CM | POA: Diagnosis not present

## 2023-11-26 DIAGNOSIS — F4321 Adjustment disorder with depressed mood: Secondary | ICD-10-CM | POA: Diagnosis not present

## 2023-11-26 DIAGNOSIS — F411 Generalized anxiety disorder: Secondary | ICD-10-CM | POA: Diagnosis not present
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
## 2023-12-04 ENCOUNTER — Encounter: Payer: Self-pay | Admitting: Professional

## 2023-12-04 ENCOUNTER — Ambulatory Visit (INDEPENDENT_AMBULATORY_CARE_PROVIDER_SITE_OTHER): Admitting: Professional

## 2023-12-04 DIAGNOSIS — F331 Major depressive disorder, recurrent, moderate: Secondary | ICD-10-CM | POA: Diagnosis not present

## 2023-12-04 DIAGNOSIS — F4321 Adjustment disorder with depressed mood: Secondary | ICD-10-CM

## 2023-12-04 DIAGNOSIS — F411 Generalized anxiety disorder: Secondary | ICD-10-CM
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
## 2023-12-12 ENCOUNTER — Ambulatory Visit: Admitting: Professional

## 2023-12-12 ENCOUNTER — Encounter: Payer: Self-pay | Admitting: Professional

## 2023-12-12 DIAGNOSIS — F411 Generalized anxiety disorder: Secondary | ICD-10-CM | POA: Diagnosis not present

## 2023-12-12 DIAGNOSIS — F331 Major depressive disorder, recurrent, moderate: Secondary | ICD-10-CM | POA: Diagnosis not present
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
## 2023-12-20 ENCOUNTER — Encounter: Payer: Self-pay | Admitting: Cardiology

## 2023-12-20 ENCOUNTER — Encounter: Payer: Self-pay | Admitting: Professional

## 2023-12-20 ENCOUNTER — Ambulatory Visit: Admitting: Professional

## 2023-12-20 DIAGNOSIS — F411 Generalized anxiety disorder: Secondary | ICD-10-CM

## 2023-12-20 DIAGNOSIS — F331 Major depressive disorder, recurrent, moderate: Secondary | ICD-10-CM | POA: Diagnosis not present
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
## 2023-12-26 ENCOUNTER — Ambulatory Visit: Admitting: Professional

## 2024-01-01 ENCOUNTER — Ambulatory Visit: Admitting: Professional

## 2024-01-01 ENCOUNTER — Encounter: Payer: Self-pay | Admitting: Professional

## 2024-01-01 DIAGNOSIS — F331 Major depressive disorder, recurrent, moderate: Secondary | ICD-10-CM

## 2024-01-01 DIAGNOSIS — F411 Generalized anxiety disorder: Secondary | ICD-10-CM | POA: Diagnosis not present

## 2024-01-01 DIAGNOSIS — F4321 Adjustment disorder with depressed mood: Secondary | ICD-10-CM

## 2024-01-01 NOTE — Progress Notes (Signed)
   Kathryn [name], [facility]
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
## 2024-01-07 ENCOUNTER — Ambulatory Visit (INDEPENDENT_AMBULATORY_CARE_PROVIDER_SITE_OTHER): Admitting: Professional

## 2024-01-07 ENCOUNTER — Encounter: Payer: Self-pay | Admitting: Professional

## 2024-01-07 DIAGNOSIS — F411 Generalized anxiety disorder: Secondary | ICD-10-CM

## 2024-01-07 DIAGNOSIS — F331 Major depressive disorder, recurrent, moderate: Secondary | ICD-10-CM
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
## 2024-01-21 ENCOUNTER — Ambulatory Visit (INDEPENDENT_AMBULATORY_CARE_PROVIDER_SITE_OTHER): Admitting: Professional

## 2024-01-21 ENCOUNTER — Encounter: Payer: Self-pay | Admitting: Professional

## 2024-01-21 DIAGNOSIS — F4321 Adjustment disorder with depressed mood: Secondary | ICD-10-CM | POA: Diagnosis not present

## 2024-01-21 DIAGNOSIS — F411 Generalized anxiety disorder: Secondary | ICD-10-CM

## 2024-01-21 DIAGNOSIS — F331 Major depressive disorder, recurrent, moderate: Secondary | ICD-10-CM
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
## 2024-01-29 ENCOUNTER — Ambulatory Visit (INDEPENDENT_AMBULATORY_CARE_PROVIDER_SITE_OTHER): Admitting: Professional

## 2024-01-29 ENCOUNTER — Encounter: Payer: Self-pay | Admitting: Professional

## 2024-01-29 DIAGNOSIS — F331 Major depressive disorder, recurrent, moderate: Secondary | ICD-10-CM | POA: Diagnosis not present

## 2024-01-29 DIAGNOSIS — F411 Generalized anxiety disorder: Secondary | ICD-10-CM | POA: Diagnosis not present
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
## 2024-02-06 ENCOUNTER — Ambulatory Visit: Admitting: Professional

## 2024-02-06 ENCOUNTER — Encounter: Payer: Self-pay | Admitting: Professional

## 2024-02-06 DIAGNOSIS — F331 Major depressive disorder, recurrent, moderate: Secondary | ICD-10-CM | POA: Insufficient documentation

## 2024-02-06 DIAGNOSIS — F411 Generalized anxiety disorder: Secondary | ICD-10-CM | POA: Insufficient documentation
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
## 2024-02-12 ENCOUNTER — Ambulatory Visit: Admitting: Professional

## 2024-02-18 NOTE — Progress Notes (Unsigned)
 HPI: FU hypertension and CAD. Renal Dopplers September 2020 showed 1 to 59% bilateral renal artery stenosis. Calcium  score November 2021 2813 which was 99th percentile. Cardiac catheterization February 2024 showed subtotal occlusion of the mid LAD, severe stenosis in the mid right coronary artery and normal left ventricular end-diastolic pressure.  Patient had PCI of the LAD and right coronary artery with drug-eluting stents.  Echocardiogram March 2024 showed normal LV function, grade 1 diastolic dysfunction.  Carotid Dopplers March 2024 showed 1 to 39% bilateral stenosis.  Repeat catheterization October 2024 due to recurrent dyspnea showed nonobstructive coronary disease with patency of the LAD and RCA stents, normal LV function and normal LVEDP.  Since last seen, the patient has dyspnea with more extreme activities but not with routine activities. It is relieved with rest. It is not associated with chest pain. There is no orthopnea, PND or pedal edema. There is no syncope or palpitations. There is no exertional chest pain.   Current Outpatient Medications  Medication Sig Dispense Refill   ALPRAZolam  (XANAX ) 0.5 MG tablet Take 0.5 mg by mouth at bedtime as needed for anxiety.     amLODipine  (NORVASC ) 10 MG tablet Take 1 tablet (10 mg total) by mouth daily. 90 tablet 3   amphetamine-dextroamphetamine (ADDERALL XR) 10 MG 24 hr capsule Take 10 mg by mouth daily as needed (ADHD).     aspirin  81 MG tablet Take 81 mg by mouth daily.     Brimonidine Tartrate (LUMIFY) 0.025 % SOLN Place 1 drop into both eyes 4 (four) times daily as needed (dry eyes).     buPROPion  (WELLBUTRIN  XL) 300 MG 24 hr tablet Take 300 mg by mouth every morning.     clobetasol (TEMOVATE) 0.05 % external solution Apply 1 Application topically 2 (two) times daily as needed (irritation).     cyanocobalamin  (VITAMIN B12) 1000 MCG tablet Take 3,000 mcg by mouth daily.     desvenlafaxine  (PRISTIQ ) 100 MG 24 hr tablet Take 100 mg by  mouth every morning.     ezetimibe  (ZETIA ) 10 MG tablet Take 1 tablet (10 mg total) by mouth daily. 90 tablet 3   levothyroxine  (SYNTHROID ) 112 MCG tablet Take 112 mcg by mouth daily before breakfast.     losartan  (COZAAR ) 100 MG tablet Take 100 mg by mouth daily.     MAGNESIUM GLYCINATE PO Take 420 mg by mouth at bedtime.     Melatonin 5 MG CHEW Chew 10 mg by mouth at bedtime. Gummy     nitroGLYCERIN  (NITROSTAT ) 0.4 MG SL tablet Place 1 tablet (0.4 mg total) under the tongue every 5 (five) minutes as needed. 25 tablet 2   Potassium Chloride  ER 20 MEQ TBCR TAKE 1 TABLET BY MOUTH TWO  TIMES DAILY 30 tablet 0   rosuvastatin  (CRESTOR ) 20 MG tablet Take 1 tablet (20 mg total) by mouth daily. TAKE 1 TABLET DAILY 90 tablet 3   triamterene -hydrochlorothiazide (MAXZIDE) 75-50 MG tablet Take 1 tablet by mouth daily. Restart this after FU with PCP     valACYclovir (VALTREX) 500 MG tablet Take 500 mg by mouth daily as needed (for outbreaks).      cholecalciferol (VITAMIN D3) 25 MCG (1000 UNIT) tablet Take 1,000 Units by mouth daily.     Multiple Vitamin (MULTIVITAMIN) capsule Take 1 capsule by mouth daily.     No current facility-administered medications for this visit.     Past Medical History:  Diagnosis Date   AKI (acute kidney injury) (HCC) 01/21/2019  Anxiety    GERD (gastroesophageal reflux disease)    Graves disease    with hyperthyroidism diagnosed at age 40 (treated with propylthiouracil for 2 years)   Hyperlipidemia    Hypertension    Hypothyroidism    Sleep disturbance     Past Surgical History:  Procedure Laterality Date   CARPAL TUNNEL RELEASE     CESAREAN SECTION     CORONARY STENT INTERVENTION N/A 09/11/2022   Procedure: CORONARY STENT INTERVENTION;  Surgeon: Wonda Sharper, MD;  Location: [facility] INVASIVE CV LAB;  Service: Cardiovascular;  Laterality: N/A;   LEFT HEART CATH AND CORONARY ANGIOGRAPHY N/A 09/11/2022   Procedure: LEFT HEART CATH AND CORONARY ANGIOGRAPHY;  Surgeon:  Wonda Sharper, MD;  Location: [facility] INVASIVE CV LAB;  Service: Cardiovascular;  Laterality: N/A;   LEFT HEART CATH AND CORONARY ANGIOGRAPHY N/A 05/08/2023   Procedure: LEFT HEART CATH AND CORONARY ANGIOGRAPHY;  Surgeon: [place], Peter M, MD;  Location: [facility] INVASIVE CV LAB;  Service: Cardiovascular;  Laterality: N/A;   Rt ovary and fallopian tube removed due to cysts      Social History   Socioeconomic History   Marital status: Divorced    Spouse name: Not on file   Number of children: 1   Years of education: Not on file   Highest education level: Not on file  Occupational History   Not on file  Tobacco Use   Smoking status: Never   Smokeless tobacco: Never  Vaping Use   Vaping status: Never Used  Substance and Sexual Activity   Alcohol use: Yes   Drug use: No   Sexual activity: Not on file  Other Topics Concern   Not on file  Social History Narrative   Not on file   Social Drivers of Health   Financial Resource Strain: Patient Declined (12/20/2023)   Received from [facility]   Overall Financial Resource Strain (CARDIA)    Difficulty of Paying Living Expenses: Patient declined  Food Insecurity: Patient Declined (12/20/2023)   Received from [facility]   Hunger Vital Sign    Within the past 12 months, you worried that your food would run out before you got the money to buy more.: Patient declined    Within the past 12 months, the food you bought just didn't last and you didn't have money to get more.: Patient declined  Transportation Needs: Patient Declined (12/20/2023)   Received from [facility] - Transportation    Lack of Transportation (Medical): Patient declined    Lack of Transportation (Non-Medical): Patient declined  Physical Activity: Unknown (12/20/2023)   Received from [facility]   Exercise Vital Sign    On average, how many days per week do you engage in moderate to strenuous exercise (like a brisk walk)?: Patient declined    Minutes of Exercise  per Session: Not on file  Stress: Patient Declined (12/20/2023)   Received from [facility] - Occupational Stress Questionnaire    Feeling of Stress : Patient declined  Social Connections: Patient Declined (12/20/2023)   Received from [facility]   Social Network    How would you rate your social network (family, work, friends)?: Patient declined  Intimate Partner Violence: Patient Declined (12/20/2023)   Received from [facility]   HITS    Over the last 12 months how often did your partner physically hurt you?: Patient declined    Over the last 12 months how often did your partner  insult you or talk down to you?: Patient declined    Over the last 12 months how often did your partner threaten you with physical harm?: Patient declined    Over the last 12 months how often did your partner scream or curse at you?: Patient declined    Family History  Problem Relation Age of Onset   Heart disease Mother        died of heart failure   Heart attack Mother 60   CAD Brother     ROS: no fevers or chills, productive cough, hemoptysis, dysphasia, odynophagia, melena, hematochezia, dysuria, hematuria, rash, seizure activity, orthopnea, PND, pedal edema, claudication. Remaining systems are negative.  Physical Exam: Well-developed well-nourished in no acute distress.  Skin is warm and dry.  HEENT is normal.  Neck is supple.  Chest is clear to auscultation with normal expansion.  Cardiovascular exam is regular rate and rhythm.  Abdominal exam nontender or distended. No masses palpated. Extremities show no edema. neuro grossly intact  EKG Interpretation Date/Time:  Wednesday February 20 2024 14:16:48 EDT Ventricular Rate:  83 PR Interval:  154 QRS Duration:  84 QT Interval:  374 QTC Calculation: 439 R Axis:   -18  Text Interpretation: Normal sinus rhythm Inferior infarct Anterior infarct Confirmed by Pietro Rogue ([id number]) on 02/20/2024 2:24:43  PM    A/P  1 coronary artery disease-patient doing well from a symptomatic standpoint.  Continue aspirin  and statin.  Most recent cardiac catheterization revealed patent stents.  2 hyperlipidemia-continue Crestor  and Zetia .  Notes she had some myalgias with higher doses of Crestor  previously.  3 hypertension-patient's blood pressure is controlled.  Continue present medications.  4 history of renal insufficiency-followed by nephrology.  Rogue Pietro, MD

## 2024-02-19 ENCOUNTER — Ambulatory Visit: Admitting: Professional

## 2024-02-20 ENCOUNTER — Ambulatory Visit: Attending: Cardiology | Admitting: Cardiology

## 2024-02-20 ENCOUNTER — Encounter: Payer: Self-pay | Admitting: Cardiology

## 2024-02-20 VITALS — BP 125/85 | HR 75 | Ht 63.5 in | Wt 157.2 lb

## 2024-02-20 DIAGNOSIS — I10 Essential (primary) hypertension: Secondary | ICD-10-CM

## 2024-02-20 DIAGNOSIS — I2510 Atherosclerotic heart disease of native coronary artery without angina pectoris: Secondary | ICD-10-CM | POA: Diagnosis not present

## 2024-02-20 DIAGNOSIS — E785 Hyperlipidemia, unspecified: Secondary | ICD-10-CM

## 2024-02-20 NOTE — Patient Instructions (Signed)

## 2024-03-04 ENCOUNTER — Ambulatory Visit: Admitting: Professional

## 2024-03-04 ENCOUNTER — Encounter: Payer: Self-pay | Admitting: Professional

## 2024-03-04 DIAGNOSIS — F331 Major depressive disorder, recurrent, moderate: Secondary | ICD-10-CM | POA: Diagnosis not present

## 2024-03-04 DIAGNOSIS — F411 Generalized anxiety disorder: Secondary | ICD-10-CM | POA: Diagnosis not present
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
## 2024-03-11 ENCOUNTER — Encounter: Payer: Self-pay | Admitting: Professional

## 2024-03-11 ENCOUNTER — Ambulatory Visit: Admitting: Professional

## 2024-03-11 DIAGNOSIS — F411 Generalized anxiety disorder: Secondary | ICD-10-CM

## 2024-03-11 DIAGNOSIS — F331 Major depressive disorder, recurrent, moderate: Secondary | ICD-10-CM
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
## 2024-03-19 ENCOUNTER — Ambulatory Visit: Admitting: Cardiology

## 2024-03-19 ENCOUNTER — Ambulatory Visit: Admitting: Professional

## 2024-03-19 ENCOUNTER — Encounter: Payer: Self-pay | Admitting: Professional

## 2024-03-19 DIAGNOSIS — F411 Generalized anxiety disorder: Secondary | ICD-10-CM | POA: Diagnosis not present

## 2024-03-19 DIAGNOSIS — F331 Major depressive disorder, recurrent, moderate: Secondary | ICD-10-CM

## 2024-03-19 DIAGNOSIS — F4321 Adjustment disorder with depressed mood: Secondary | ICD-10-CM
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
## 2024-03-25 ENCOUNTER — Ambulatory Visit: Admitting: Professional

## 2024-03-25 ENCOUNTER — Encounter: Payer: Self-pay | Admitting: Professional

## 2024-03-25 DIAGNOSIS — F331 Major depressive disorder, recurrent, moderate: Secondary | ICD-10-CM

## 2024-03-25 DIAGNOSIS — F411 Generalized anxiety disorder: Secondary | ICD-10-CM | POA: Diagnosis not present
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
## 2024-04-01 ENCOUNTER — Encounter: Payer: Self-pay | Admitting: Professional

## 2024-04-01 ENCOUNTER — Ambulatory Visit: Admitting: Professional

## 2024-04-01 DIAGNOSIS — F331 Major depressive disorder, recurrent, moderate: Secondary | ICD-10-CM | POA: Diagnosis not present

## 2024-04-01 DIAGNOSIS — F411 Generalized anxiety disorder: Secondary | ICD-10-CM | POA: Diagnosis not present
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
## 2024-04-04 ENCOUNTER — Other Ambulatory Visit: Payer: Self-pay

## 2024-04-08 ENCOUNTER — Ambulatory Visit: Admitting: Professional

## 2024-04-15 ENCOUNTER — Encounter: Payer: Self-pay | Admitting: Professional

## 2024-04-15 ENCOUNTER — Ambulatory Visit: Admitting: Professional

## 2024-04-15 DIAGNOSIS — F331 Major depressive disorder, recurrent, moderate: Secondary | ICD-10-CM

## 2024-04-15 DIAGNOSIS — F411 Generalized anxiety disorder: Secondary | ICD-10-CM
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
## 2024-04-29 ENCOUNTER — Encounter: Payer: Self-pay | Admitting: Professional

## 2024-04-29 ENCOUNTER — Ambulatory Visit: Admitting: Professional

## 2024-04-29 DIAGNOSIS — F411 Generalized anxiety disorder: Secondary | ICD-10-CM

## 2024-04-29 DIAGNOSIS — F331 Major depressive disorder, recurrent, moderate: Secondary | ICD-10-CM
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
## 2024-05-13 ENCOUNTER — Ambulatory Visit: Admitting: Professional

## 2024-05-27 ENCOUNTER — Encounter: Payer: Self-pay | Admitting: Professional

## 2024-05-27 ENCOUNTER — Ambulatory Visit: Admitting: Professional

## 2024-05-27 DIAGNOSIS — F331 Major depressive disorder, recurrent, moderate: Secondary | ICD-10-CM | POA: Diagnosis not present

## 2024-05-27 DIAGNOSIS — F411 Generalized anxiety disorder: Secondary | ICD-10-CM | POA: Diagnosis not present
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
## 2024-05-27 NOTE — Progress Notes (Signed)
   Kathryn [name], [facility]

## 2024-06-02 ENCOUNTER — Ambulatory Visit: Admitting: Professional

## 2024-06-10 ENCOUNTER — Ambulatory Visit: Admitting: Professional

## 2024-06-10 ENCOUNTER — Encounter: Payer: Self-pay | Admitting: Professional

## 2024-06-10 DIAGNOSIS — F331 Major depressive disorder, recurrent, moderate: Secondary | ICD-10-CM

## 2024-06-10 DIAGNOSIS — F411 Generalized anxiety disorder: Secondary | ICD-10-CM
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
## 2024-06-16 ENCOUNTER — Encounter: Payer: Self-pay | Admitting: Behavioral Health

## 2024-06-16 ENCOUNTER — Ambulatory Visit (INDEPENDENT_AMBULATORY_CARE_PROVIDER_SITE_OTHER): Admitting: Behavioral Health

## 2024-06-16 VITALS — BP 128/87 | HR 87 | Ht 64.0 in | Wt 153.0 lb

## 2024-06-16 DIAGNOSIS — F5105 Insomnia due to other mental disorder: Secondary | ICD-10-CM | POA: Diagnosis not present

## 2024-06-16 DIAGNOSIS — F411 Generalized anxiety disorder: Secondary | ICD-10-CM

## 2024-06-16 DIAGNOSIS — F902 Attention-deficit hyperactivity disorder, combined type: Secondary | ICD-10-CM | POA: Diagnosis not present

## 2024-06-16 DIAGNOSIS — F331 Major depressive disorder, recurrent, moderate: Secondary | ICD-10-CM | POA: Diagnosis not present

## 2024-06-16 DIAGNOSIS — F99 Mental disorder, not otherwise specified: Secondary | ICD-10-CM

## 2024-06-16 MED ORDER — DESVENLAFAXINE SUCCINATE ER 50 MG PO TB24: 50.0000 mg | ORAL_TABLET | Freq: Every day | ORAL | 1 refills | Status: DC

## 2024-06-16 MED ORDER — BUPROPION HCL ER (XL) 300 MG PO TB24: 300.0000 mg | ORAL_TABLET | ORAL | 1 refills | Status: AC

## 2024-06-16 MED ORDER — DESVENLAFAXINE SUCCINATE ER 100 MG PO TB24: 100.0000 mg | ORAL_TABLET | ORAL | 1 refills | Status: AC

## 2024-06-16 MED ORDER — QUETIAPINE FUMARATE 25 MG PO TABS: ORAL_TABLET | ORAL | 1 refills | Status: DC

## 2024-06-16 NOTE — Progress Notes (Signed)
 [facility] MD/PA/NP Initial Note  06/16/2024 4:27 PM Kathryn [name]  MRN:  [id number]  Chief Complaint:  Chief Complaint   Depression; Anxiety; Follow-up; Medication Refill; Patient Education     HPI:  Kathryn [name], 63 year old female presents to this office for initial visit and to establish care.  Collateral information should be considered reliable.  Patient appears distracted at times with fair eye contact.  Patient reports getting divorced from husband approximately 2 years ago.  Still experiencing some unresolved grief over the process.  She has been getting psychotherapy from [facility].  Her PCP has been managing her psychotropic medications.  Says that she has been suffering from anxiety, depression, and ADHD since her 30s.  She cannot remember all of the medications she has tried but has experienced multiple failures with SSRIs.  Says that she has been on her current medication regimen for approximately 8 years without making any changes.  Still experiencing a level of anxiety and depression.  Reports anxiety today 6/10, and depression at 7/10.  Her MDQ was negative with only 3/14 criterion marked yes.  Reports poor sleep but only 4 to 5 hours per night.  Appetite is good.  Reports having a hard time motivating herself.  She does endorse increased talkative but he at times, racing thoughts, rumination especially at bedtime, and lack of concentration.  Patient states that she is open to adjusting her medications.  Patient reports that she is living alone in [facility] .  She is currently not in any relationships nor she currently sexually active.  She is socializing with friends on a regular basis.  Enjoys going to concerts.  Denies any current mania, no history of psychosis, no past hospitalizations, no history of auditory or visual hallucinations.  Denies SI or HI.   Visit Diagnosis:    ICD-10-CM   1. Attention deficit hyperactivity disorder (ADHD), combined type  F90.2     2.  Major depressive disorder, recurrent episode, moderate (HCC)  F33.1 desvenlafaxine  (PRISTIQ ) 50 MG 24 hr tablet    desvenlafaxine  (PRISTIQ ) 100 MG 24 hr tablet    buPROPion  (WELLBUTRIN  XL) 300 MG 24 hr tablet    3. Generalized anxiety disorder  F41.1 desvenlafaxine  (PRISTIQ ) 50 MG 24 hr tablet    desvenlafaxine  (PRISTIQ ) 100 MG 24 hr tablet    buPROPion  (WELLBUTRIN  XL) 300 MG 24 hr tablet    4. Insomnia due to other mental disorder  F51.05 QUEtiapine  (SEROQUEL ) 25 MG tablet   F99       Past Psychiatric History: anxiety, MDD, ADHD  Past Medical History:  Past Medical History:  Diagnosis Date   AKI (acute kidney injury) 01/21/2019   Anxiety    GERD (gastroesophageal reflux disease)    Graves disease    with hyperthyroidism diagnosed at age 56 (treated with propylthiouracil for 2 years)   Hyperlipidemia    Hypertension    Hypothyroidism    Sleep disturbance     Past Surgical History:  Procedure Laterality Date   CARPAL TUNNEL RELEASE     CESAREAN SECTION     CORONARY STENT INTERVENTION N/A 09/11/2022   Procedure: CORONARY STENT INTERVENTION;  Surgeon: Wonda Sharper, MD;  Location: [facility] INVASIVE CV LAB;  Service: Cardiovascular;  Laterality: N/A;   LEFT HEART CATH AND CORONARY ANGIOGRAPHY N/A 09/11/2022   Procedure: LEFT HEART CATH AND CORONARY ANGIOGRAPHY;  Surgeon: Wonda Sharper, MD;  Location: [facility] INVASIVE CV LAB;  Service: Cardiovascular;  Laterality: N/A;   LEFT HEART CATH AND CORONARY ANGIOGRAPHY  N/A 05/08/2023   Procedure: LEFT HEART CATH AND CORONARY ANGIOGRAPHY;  Surgeon: Jordan, Peter M, MD;  Location: [facility] INVASIVE CV LAB;  Service: Cardiovascular;  Laterality: N/A;   Rt ovary and fallopian tube removed due to cysts      Family Psychiatric History: ETOH, Substance Abuse, Depression, Anxiety  Family History:  Family History  Problem Relation Age of Onset   Heart disease Mother        died of heart failure   Heart attack Mother 65   Alcohol abuse Father    Anxiety  disorder Sister    Depression Sister    Alcohol abuse Brother    Anxiety disorder Brother    Depression Brother    CAD Brother    Drug abuse Child    Alcohol abuse Child     Social History:  Social History   Socioeconomic History   Marital status: Divorced    Spouse name: Not on file   Number of children: 1   Years of education: Not on file   Highest education level: Not on file  Occupational History   Occupation: Unemployed  Tobacco Use   Smoking status: Never   Smokeless tobacco: Never  Vaping Use   Vaping status: Never Used  Substance and Sexual Activity   Alcohol use: Yes    Comment: light social   Drug use: No    Comment: recently tried edibles cannabis   Sexual activity: Not Currently  Other Topics Concern   Not on file  Social History Narrative   Living in [place]. Lives alone.    Social Drivers of Health   Financial Resource Strain: Patient Declined (12/20/2023)   Received from [facility]   Overall Financial Resource Strain (CARDIA)    Difficulty of Paying Living Expenses: Patient declined  Food Insecurity: Patient Declined (12/20/2023)   Received from [facility]   Hunger Vital Sign    Within the past 12 months, you worried that your food would run out before you got the money to buy more.: Patient declined    Within the past 12 months, the food you bought just didn't last and you didn't have money to get more.: Patient declined  Transportation Needs: Patient Declined (12/20/2023)   Received from [facility] - Transportation    Lack of Transportation (Medical): Patient declined    Lack of Transportation (Non-Medical): Patient declined  Physical Activity: Unknown (12/20/2023)   Received from [facility]   Exercise Vital Sign    On average, how many days per week do you engage in moderate to strenuous exercise (like a brisk walk)?: Patient declined    Minutes of Exercise per Session: Not on file  Stress: Patient Declined (12/20/2023)    Received from [facility] - Occupational Stress Questionnaire    Feeling of Stress : Patient declined  Social Connections: Patient Declined (12/20/2023)   Received from [facility]   Social Network    How would you rate your social network (family, work, friends)?: Patient declined    Allergies:  Allergies  Allergen Reactions   Hydrocortisone Rash   Flagyl [Metronidazole] Nausea Only    Nausea and Headache   Tetracyclines & Related Nausea And Vomiting    Metabolic Disorder Labs: Lab Results  Component Value Date   HGBA1C 5.8 (H) 01/21/2019   MPG 119.76 01/21/2019   MPG 123 (H) 12/10/2013   No results found for: PROLACTIN Lab Results  Component  Value Date   CHOL 115 08/23/2023   TRIG 53 08/23/2023   HDL 50 08/23/2023   CHOLHDL 2.3 08/23/2023   LDLCALC 53 08/23/2023   LDLCALC 58 10/30/2022   Lab Results  Component Value Date   TSH 3.858 01/21/2019   TSH 3.042 12/10/2013    Therapeutic Level Labs: No results found for: LITHIUM No results found for: VALPROATE No results found for: CBMZ  Current Medications: Current Outpatient Medications  Medication Sig Dispense Refill   desvenlafaxine  (PRISTIQ ) 50 MG 24 hr tablet Take 1 tablet (50 mg total) by mouth daily. 90 tablet 1   QUEtiapine (SEROQUEL) 25 MG tablet Take 1-2 tablets at bedtime daily for sleep and anxiety 60 tablet 1   ALPRAZolam  (XANAX ) 0.5 MG tablet Take 0.5 mg by mouth at bedtime as needed for anxiety.     amLODipine  (NORVASC ) 10 MG tablet Take 1 tablet (10 mg total) by mouth daily. 90 tablet 3   amphetamine-dextroamphetamine (ADDERALL XR) 10 MG 24 hr capsule Take 10 mg by mouth daily as needed (ADHD).     aspirin  81 MG tablet Take 81 mg by mouth daily.     Brimonidine Tartrate (LUMIFY) 0.025 % SOLN Place 1 drop into both eyes 4 (four) times daily as needed (dry eyes).     buPROPion  (WELLBUTRIN  XL) 300 MG 24 hr tablet Take 1 tablet (300 mg total) by mouth  every morning. 90 tablet 1   cholecalciferol (VITAMIN D3) 25 MCG (1000 UNIT) tablet Take 1,000 Units by mouth daily.     clobetasol (TEMOVATE) 0.05 % external solution Apply 1 Application topically 2 (two) times daily as needed (irritation).     cyanocobalamin  (VITAMIN B12) 1000 MCG tablet Take 3,000 mcg by mouth daily.     desvenlafaxine  (PRISTIQ ) 100 MG 24 hr tablet Take 1 tablet (100 mg total) by mouth every morning. 90 tablet 1   Desvenlafaxine  ER 100 MG TB24 Take 100 mg by mouth daily after breakfast.     ezetimibe  (ZETIA ) 10 MG tablet Take 1 tablet (10 mg total) by mouth daily. 90 tablet 3   levothyroxine  (SYNTHROID ) 112 MCG tablet Take 112 mcg by mouth daily before breakfast.     losartan  (COZAAR ) 100 MG tablet Take 100 mg by mouth daily.     MAGNESIUM GLYCINATE PO Take 420 mg by mouth at bedtime.     Melatonin 5 MG CHEW Chew 10 mg by mouth at bedtime. Gummy     Multiple Vitamin (MULTIVITAMIN) capsule Take 1 capsule by mouth daily.     nitroGLYCERIN  (NITROSTAT ) 0.4 MG SL tablet Place 1 tablet (0.4 mg total) under the tongue every 5 (five) minutes as needed. 25 tablet 2   Potassium Chloride  ER 20 MEQ TBCR TAKE 1 TABLET BY MOUTH TWO  TIMES DAILY 30 tablet 0   rosuvastatin  (CRESTOR ) 20 MG tablet Take 1 tablet (20 mg total) by mouth daily. TAKE 1 TABLET DAILY 90 tablet 3   triamterene -hydrochlorothiazide (MAXZIDE) 75-50 MG tablet Take 1 tablet by mouth daily. Restart this after FU with PCP     valACYclovir (VALTREX) 500 MG tablet Take 500 mg by mouth daily as needed (for outbreaks).      No current facility-administered medications for this visit.    Medication Side Effects: none  Orders placed this visit:  No orders of the defined types were placed in this encounter.   Psychiatric Specialty Exam:  Review of Systems  Constitutional: Negative.   Allergic/Immunologic: Negative.   Neurological: Negative.   Psychiatric/Behavioral:  Positive for decreased concentration and dysphoric  mood. The patient is nervous/anxious.     Blood pressure 128/87, pulse 87, height 5' 4 (1.626 m), weight 153 lb (69.4 kg).Body mass index is 26.26 kg/m.  General Appearance: Casual, Neat, and Well Groomed  Eye Contact:  Fair  Speech:  Talkative  Volume:  Normal  Mood:  Anxious, Depressed, and Dysphoric  Affect:  Depressed, Tearful, and Anxious  Thought Process:  Coherent  Orientation:  Full (Time, Place, and Person)  Thought Content: Rumination   Suicidal Thoughts:  No  Homicidal Thoughts:  No  Memory:  WNL  Judgement:  Good  Insight:  Fair  Psychomotor Activity:  Normal  Concentration:  Concentration: Fair  Recall:  Fair  Fund of Knowledge: Fair  Language: Good  Assets:  Desire for Improvement  ADL's:  Intact  Cognition: WNL  Prognosis:  Good   Screenings:  PHQ2-9    Flowsheet Row INTENSIVE CARDIAC REHAB from 01/03/2023 in [facility] INTENSIVE CARDIAC REHAB ORIENT from 11/01/2022 in [facility]  PHQ-2 Total Score 2 2  PHQ-9 Total Score 7 13   Flowsheet Row Admission (Discharged) from 05/08/2023 in [facility] CARDIAC CATH LAB  C-SSRS RISK CATEGORY No Risk    Receiving Psychotherapy: No   Treatment Plan/Recommendations:  Greater than 50% of 60 min face to face time with patient was spent on counseling and coordination of care. We discussed her long history of anxiety,depression, and ADHD stemming back to her 30s.  We discussed her previous plan of care with PCP and psychotherapy with Nathanel Collet.  Her PCP Aldona Pizza has been bridging her psychotropic medication.  She is currently experiencing high levels of anxiety and depression in which multiple trials of antidepressants have not been effective long-term.  We agreed today to: To increase Pristiq  to 150 mg daily in the a.m.  Rx written in 2 separate scripts 100 mg and 50 mg  capsules. To continue Wellbutrin  300 mg daily. Will start Seroquel  25-50 mg daily at bedtime for sleep and anxiety. Will report worsening symptoms or side effects promptly Provided emergency contact information Discussed potential metabolic side effects associated with atypical antipsychotics, as well as potential risk for movement side effects. Advised pt to contact office if movement side effects occur.   Reviewed PDMP   Redell DELENA Pizza, NP

## 2024-06-17 ENCOUNTER — Encounter: Payer: Self-pay | Admitting: Professional

## 2024-06-17 ENCOUNTER — Ambulatory Visit: Admitting: Professional

## 2024-06-17 DIAGNOSIS — F411 Generalized anxiety disorder: Secondary | ICD-10-CM

## 2024-06-17 DIAGNOSIS — F331 Major depressive disorder, recurrent, moderate: Secondary | ICD-10-CM

## 2024-06-17 DIAGNOSIS — F902 Attention-deficit hyperactivity disorder, combined type: Secondary | ICD-10-CM
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
## 2024-06-24 ENCOUNTER — Ambulatory Visit: Admitting: Professional

## 2024-07-01 ENCOUNTER — Ambulatory Visit: Admitting: Professional

## 2024-07-01 ENCOUNTER — Encounter: Payer: Self-pay | Admitting: Professional

## 2024-07-01 DIAGNOSIS — F902 Attention-deficit hyperactivity disorder, combined type: Secondary | ICD-10-CM | POA: Diagnosis not present

## 2024-07-01 DIAGNOSIS — F411 Generalized anxiety disorder: Secondary | ICD-10-CM

## 2024-07-01 DIAGNOSIS — F331 Major depressive disorder, recurrent, moderate: Secondary | ICD-10-CM
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
## 2024-07-09 ENCOUNTER — Other Ambulatory Visit: Payer: Self-pay | Admitting: Behavioral Health

## 2024-07-09 DIAGNOSIS — F5105 Insomnia due to other mental disorder: Secondary | ICD-10-CM

## 2024-07-14 ENCOUNTER — Other Ambulatory Visit: Payer: Self-pay | Admitting: Cardiology

## 2024-07-14 ENCOUNTER — Ambulatory Visit: Admitting: Behavioral Health

## 2024-07-14 ENCOUNTER — Encounter: Payer: Self-pay | Admitting: Behavioral Health

## 2024-07-14 DIAGNOSIS — F411 Generalized anxiety disorder: Secondary | ICD-10-CM

## 2024-07-14 DIAGNOSIS — F99 Mental disorder, not otherwise specified: Secondary | ICD-10-CM

## 2024-07-14 DIAGNOSIS — E7800 Pure hypercholesterolemia, unspecified: Secondary | ICD-10-CM

## 2024-07-14 DIAGNOSIS — F902 Attention-deficit hyperactivity disorder, combined type: Secondary | ICD-10-CM | POA: Diagnosis not present

## 2024-07-14 DIAGNOSIS — F331 Major depressive disorder, recurrent, moderate: Secondary | ICD-10-CM | POA: Diagnosis not present

## 2024-07-14 DIAGNOSIS — F5105 Insomnia due to other mental disorder: Secondary | ICD-10-CM

## 2024-07-14 MED ORDER — QUETIAPINE FUMARATE 25 MG PO TABS: ORAL_TABLET | ORAL | 1 refills | Status: DC

## 2024-07-14 MED ORDER — DESVENLAFAXINE ER 100 MG PO TB24: 100.0000 mg | ORAL_TABLET | Freq: Every day | ORAL | 1 refills | Status: AC

## 2024-07-14 NOTE — Progress Notes (Signed)
 "     [facility]  Patient ID: Kathryn [name],  MRN: [id number]  PCP: Kathryn Aldona CROME, NP  Date of Evaluation: 07/14/2024 Time spent:30 minutes  Chief Complaint:  Chief Complaint   ADHD; Depression; Anxiety; Follow-up; Medication Refill; Patient Education     HISTORY/CURRENT STATUS: HPI Kathryn [name], 63 year old [name] presents to this office for follow-up and medication management.  Peers to have some slower processing when speaking, which may be related to ADHD.  States that she has been sick and not taking her Seroquel  over the last week.  Says that she will resume this evening.  Still experiencing a level of anxiety and depression.  Reports anxiety today 6/10, and depression at 6/10.    Sleep improved with Seroquel  7 to 8 hours per night.  Appetite is good.  Patient reports that she is living alone in [facility] .  She is socializing with friends on a regular basis.   Denies any current mania, no history of psychosis, no past hospitalizations, no history of auditory or visual hallucinations.  Denies SI or HI.     Individual Medical History/ Review of Systems: Changes? :No   Allergies: Hydrocortisone, Flagyl [metronidazole], and Tetracyclines & related  Current Medications: Current Medications[1] Medication Side Effects: none  Family Medical/ Social History: Changes? No  MENTAL HEALTH EXAM:  There were no vitals taken for this visit.There is no height or weight on file to calculate BMI.  General Appearance: Casual, Neat, and Well Groomed  Eye Contact:  Good  Speech:  Clear and Coherent  Volume:  Normal  Mood:  Anxious and Depressed  Affect:  Depressed and Anxious  Thought Process:  Coherent  Orientation:  Full (Time, Place, and Person)  Thought Content: Logical   Suicidal Thoughts:  No  Homicidal Thoughts:  No  Memory:  WNL  Judgement:  Good  Insight:  Good  Psychomotor Activity:  Normal  Concentration:  Concentration: Good  Recall:  Good  Fund of  Knowledge: Good  Language: Good  Assets:  Desire for Improvement  ADL's:  Intact  Cognition: WNL  Prognosis:  Good    DIAGNOSES:    ICD-10-CM   1. Major depressive disorder, recurrent episode, moderate (HCC)  F33.1 Desvenlafaxine  ER 100 MG TB24    2. Insomnia due to other mental disorder  F51.05 QUEtiapine  (SEROQUEL ) 25 MG tablet   F99     3. Generalized anxiety disorder  F41.1 Desvenlafaxine  ER 100 MG TB24    4. Attention deficit hyperactivity disorder (ADHD), combined type  F90.2       Receiving Psychotherapy: yes   RECOMMENDATIONS:   Greater than 50% of 30 min face to face time with patient was spent on counseling and coordination of care.  Discussed her minimal improvement with anxiety and depression since last visit, however she reports better sleep and reduce anxiety when taking Seroquel .  She has not taken the medication in approximately 1 week due to being sick.  She will resume this evening.    We agreed today to: To continue  Pristiq  to 150 mg daily in the a.m.  Rx written in 2 separate scripts 100 mg and 50 mg capsules. To continue Wellbutrin  300 mg daily. To continue Seroquel  25-50 mg daily at bedtime for sleep and anxiety. Will report worsening symptoms or side effects promptly Provided emergency contact information Discussed potential metabolic side effects associated with atypical antipsychotics, as well as potential risk for movement side effects. Advised pt to contact office if  movement side effects occur.   Reviewed PDMP   Kathryn DELENA Pizza, NP     [1]  Current Outpatient Medications:    ALPRAZolam  (XANAX ) 0.5 MG tablet, Take 0.5 mg by mouth at bedtime as needed for anxiety., Disp: , Rfl:    amLODipine  (NORVASC ) 10 MG tablet, Take 1 tablet (10 mg total) by mouth daily., Disp: 90 tablet, Rfl: 3   amphetamine-dextroamphetamine (ADDERALL XR) 10 MG 24 hr capsule, Take 10 mg by mouth daily as needed (ADHD)., Disp: , Rfl:    aspirin  81 MG tablet, Take 81 mg by  mouth daily., Disp: , Rfl:    Brimonidine Tartrate (LUMIFY) 0.025 % SOLN, Place 1 drop into both eyes 4 (four) times daily as needed (dry eyes)., Disp: , Rfl:    buPROPion  (WELLBUTRIN  XL) 300 MG 24 hr tablet, Take 1 tablet (300 mg total) by mouth every morning., Disp: 90 tablet, Rfl: 1   clobetasol (TEMOVATE) 0.05 % external solution, Apply 1 Application topically 2 (two) times daily as needed (irritation)., Disp: , Rfl:    cyanocobalamin  (VITAMIN B12) 1000 MCG tablet, Take 3,000 mcg by mouth daily., Disp: , Rfl:    desvenlafaxine  (PRISTIQ ) 100 MG 24 hr tablet, Take 1 tablet (100 mg total) by mouth every morning., Disp: 90 tablet, Rfl: 1   desvenlafaxine  (PRISTIQ ) 50 MG 24 hr tablet, Take 1 tablet (50 mg total) by mouth daily., Disp: 90 tablet, Rfl: 1   Desvenlafaxine  ER 100 MG TB24, Take 1 tablet (100 mg total) by mouth daily after breakfast., Disp: 90 tablet, Rfl: 1   ezetimibe  (ZETIA ) 10 MG tablet, Take 1 tablet (10 mg total) by mouth daily., Disp: 90 tablet, Rfl: 3   levothyroxine  (SYNTHROID ) 112 MCG tablet, Take 112 mcg by mouth daily before breakfast., Disp: , Rfl:    losartan  (COZAAR ) 100 MG tablet, Take 100 mg by mouth daily., Disp: , Rfl:    MAGNESIUM GLYCINATE PO, Take 420 mg by mouth at bedtime., Disp: , Rfl:    Melatonin 5 MG CHEW, Chew 10 mg by mouth at bedtime. Gummy, Disp: , Rfl:    nitroGLYCERIN  (NITROSTAT ) 0.4 MG SL tablet, Place 1 tablet (0.4 mg total) under the tongue every 5 (five) minutes as needed., Disp: 25 tablet, Rfl: 2   Potassium Chloride  ER 20 MEQ TBCR, TAKE 1 TABLET BY MOUTH TWO  TIMES DAILY, Disp: 30 tablet, Rfl: 0   QUEtiapine  (SEROQUEL ) 25 MG tablet, Take 1-2 tablets at bedtime daily for sleep and anxiety, Disp: 180 tablet, Rfl: 1   rosuvastatin  (CRESTOR ) 20 MG tablet, Take 1 tablet (20 mg total) by mouth daily. TAKE 1 TABLET DAILY, Disp: 90 tablet, Rfl: 3   triamterene -hydrochlorothiazide (MAXZIDE) 75-50 MG tablet, Take 1 tablet by mouth daily. Restart this after FU  with PCP, Disp: , Rfl:    valACYclovir (VALTREX) 500 MG tablet, Take 500 mg by mouth daily as needed (for outbreaks). , Disp: , Rfl:   "

## 2024-07-22 ENCOUNTER — Encounter: Payer: Self-pay | Admitting: Professional

## 2024-07-22 ENCOUNTER — Ambulatory Visit: Admitting: Professional

## 2024-07-22 DIAGNOSIS — F411 Generalized anxiety disorder: Secondary | ICD-10-CM

## 2024-07-22 DIAGNOSIS — F99 Mental disorder, not otherwise specified: Secondary | ICD-10-CM

## 2024-07-22 DIAGNOSIS — F331 Major depressive disorder, recurrent, moderate: Secondary | ICD-10-CM | POA: Diagnosis not present

## 2024-07-22 DIAGNOSIS — F902 Attention-deficit hyperactivity disorder, combined type: Secondary | ICD-10-CM | POA: Diagnosis not present

## 2024-07-22 DIAGNOSIS — F5105 Insomnia due to other mental disorder: Secondary | ICD-10-CM

## 2024-07-22 NOTE — Progress Notes (Signed)
 "  [facility] Counselor/Therapist Progress Note  Patient ID: Kathryn [name], MRN: [id number],    Date: 07/22/2024  Time Spent: 55 minutes 1005-11am  Treatment Type: Individual Therapy  Risk Assessment: Danger to Self:  No Self-injurious Behavior: No Danger to Others: No  Subjective: This session was held via video teletherapy. The patient consented to video teletherapy and was located in her home during this session. She is aware it is the responsibility of the patient to secure confidentiality on her end of the session. The provider was in a private home office for the duration of this session.    The patient arrived on time for her Caregility appointment.  Issues addressed: 1-homework a-referral submitted for CBT-I with Terri Bauert -pt has gotten ppw but did not complete in entirety -it has been resent by [facility] b-pt to monitor positive impacts of medication changes -pt reports she was not Seroquel  for the period that she was sick -pt did inform her dr at her appointment on Dec 27th -MD increased Pristiq  to 150mg  -pt feels calmer, or learning to be calmer -she did notice the Seroquel  was helping -she has not taken the Adderall since she got sick c-track/monitor sleep -she began making herself turn off the TV and go to bed until she got sick and was sleeping excessively 2-physical -was sick the entire Christmas season  Treatment Plan Problems: Anxiety, Low Self-Esteem, Unipolar Depression Symptoms: Excessive and/or unrealistic worry that is difficult to control occurring more days than not for at least 6 months about a number of events or activities. Hypervigilance (e.g., feeling constantly on edge, experiencing concentration difficulties, having trouble falling or staying asleep, exhibiting a general state of irritability). Inability to accept compliments. Makes self-disparaging remarks; sees self as unattractive, worthless, a loser, a burden, unimportant; takes  blame easily. Difficulty in saying no to others; assumes not being liked by others. Fear of rejection by others, especially peer group. Anxious and uncomfortable in social situations. Inability to identify positive characteristics of self. Decrease or loss of appetite. Diminished interest in or enjoyment of activities. Psychomotor agitation or retardation. Sleeplessness or hypersomnia. Lack of energy. Low self-esteem. History of chronic or recurrent depression for which the client has taken antidepressant medication, been hospitalized, had outpatient treatment, or had a course of electroconvulsive therapy. Goals: Reduce overall frequency, intensity, and duration of the anxiety so that daily functioning is not impaired. Enhance ability to effectively cope with the full variety of life's worries and anxieties. Learn and implement coping skills that result in a reduction of anxiety and worry, and improved daily functioning. Elevate self-esteem. Develop a consistent, positive self-image. Demonstrate improved self-esteem through more pride in appearance, more assertiveness, greater eye contact, and identification of positive traits in self-talk messages. Establish an inward sense of self-worth, confidence, and competence. Interact socially without undue distress or disability. Alleviate depressive symptoms and return to previous level of effective functioning. Develop healthy thinking patterns and beliefs about self, others, and the world that lead to the alleviation and help prevent the relapse of depression. Develop healthy interpersonal relationships that lead to the alleviation and help prevent the relapse of depression. Appropriately grieve the loss in order to normalize mood and to return to previously adaptive level of functioning. Objectives target date for all objectives is 10/29/2024: Describe situations, thoughts, feelings, and actions associated with anxieties and worries, their  impact on functioning, and attempts to resolve them.   60% Verbalize an understanding of the cognitive, physiological, and behavioral components of  anxiety and its treatment.   60% Learn and implement calming skills to reduce overall anxiety and manage anxiety symptoms.   70% Learn and implement a strategy to limit the association between various environmental settings and worry, delaying the worry until a designated worry time.  70% Verbalize an understanding of the role that cognitive biases play in excessive irrational worry and persistent anxiety symptoms.   80% Identify, challenge, and replace biased, fearful self-talk with positive, realistic, and empowering self-talk.   70% Learn and implement problem-solving strategies for realistically addressing worries.   70% Identify and engage in pleasant activities on a daily basis.   80% Increase insight into the historical and current sources of low self-esteem.   70% Decrease the frequency of negative self-descriptive statements and increase frequency of positive self-descriptive statements.   80% Identify and replace negative self-talk messages used to reinforce low self-esteem.   70% Decrease the verbalized fear of rejection while increasing statements of self-acceptance.   80%    Identify and engage in activities that would improve self-image by being consistent with one's values.   80% Identify positive traits and talents about self.   80% Demonstrate an increased ability to identify and express personal feelings.   70% Articulate a plan to be proactive in trying to get identified needs met.   70% Positively acknowledge verbal compliments from others.   80% Increase the frequency of assertive behaviors.   80% Describe current and past experiences with depression including their impact on functioning and attempts to resolve it.   80% Identify and replace thoughts and beliefs that support depression.   70% Learn and implement behavioral  strategies to overcome depression.   80% Identify important people in life, past and present, and describe the quality, good and poor, of those relationships.   80% Learn and implement problem-solving and decision-making skills.   60% Learn and implement conflict resolution skills to resolve interpersonal problems.   70% Learn and implement relapse prevention skills.   70% Implement mindfulness techniques for relapse prevention.   70% Verbalize insight into how past relationships may be influencing current experiences with depression.   50% Increasingly verbalize hopeful and positive statements regarding self, others, and the future.   60% Interventions: Assist the client in identifying and verbalizing his/her needs, met and unmet. Assist the client in identifying and labeling emotions. Assign the client to keep a journal of feelings on a daily basis. Help the client analyze his/her values and the congruence or incongruence between them and the client's daily activities. Identify and assign activities congruent with the client's values; process them toward improving self-concept and self-esteem. Assign the client the exercise of identifying his/her positive physical characteristics in a mirror to help him/her become more comfortable with himself/herself. Ask the client to keep building a list of positive traits and have him/her read the list at the beginning and end of each session (or assign Acknowledging My Strengths or What Are My Good Qualities? in the Adult Psychotherapy Homework Planner by [facility]); reinforce the client's positive self-descriptive statements. Assign the client to be aware of and acknowledge graciously (without discounting) praise and compliments from others. Train the client in assertiveness or refer him/her to a group that will educate and facilitate assertiveness skills via lectures and assignments. Help the client become aware of his/her fear of rejection and its  connection with past rejection or abandonment experiences; begin to contrast past experiences of pain with present experiences of acceptance and competence. Discuss, emphasize, and  interpret the client's incidents of abuse (emotional, physical, and sexual) and how they have impacted his/her feelings about himself/herself. Assist the client in becoming aware of how he/she expresses or acts out negative feelings about himself/herself. Help the client reframe his/her negative assessment of himself/herself. Assist the client in developing positive self-talk as a way of boosting his/her confidence and self-image (or assign Positive Self-Talk in the Adult Psychotherapy Homework Planner by Jenniffer). Help the client identify his/her distorted, negative beliefs about self and the world and replace these messages with more realistic, affirmative messages (or assign Journal and Replace Self-Defeating Thoughts in the Adult Psychotherapy Homework Planner by Jenniffer or read What to Say When You Talk to Yourself by Helmstetter). Ask the client to make one positive statement about himself/herself daily and record it on a chart or in a journal) or assign Replacing Fears with Positive Messages in the Adult Psychotherapy Homework Planner by Jenniffer). Verbally reinforce the client's  use of positive statements of confidence and accomplishments. Ask the client to describe his/her past experiences of anxiety and their impact on functioning; assess the focus, excessiveness, and uncontrollability of the worry and the type, frequency, intensity, and duration of his/her anxiety symptoms (consider using a structured interview such as The Anxiety Disorders Interview Schedule-Adult Version). Explore the client's schema and self-talk that mediate his/her fear response; assist him/her in challenging the biases; replace the distorted messages with reality-based alternatives and positive, realistic self-talk that will increase  his/her self-confidence in coping with irrational fears (see Cognitive Therapy of Anxiety Disorders by Gretta armin Mon). Teach the client problem-solving strategies involving specifically defining a problem, generating options for addressing it, evaluating the pros and cons of each option, selecting and implementing an optional action, and reevaluating and refining the action (or assign Applying Problem-Solving to Interpersonal Conflict in the Adult Psychotherapy Homework Planner by Jenniffer). Engage the client in behavioral activation, increasing the client's contact with sources of reward, identifying processes that inhibit activation, and teaching skills to solve life problems (or assign Identify and Schedule Pleasant Activities in the Adult Psychotherapy Homework Planner by Jenniffer); use behavioral techniques such as instruction, rehearsal, role-playing, role reversal as needed to assist adoption into the client's daily life; reinforce success. Discuss how generalized anxiety typically involves excessive worry about unrealistic threats, various bodily expressions of tension, overarousal, and hypervigilance, and avoidance of what is threatening that interact to maintain the problem (see Mastery of Your Anxiety and Worry: Therapist Guide by Venson River, and Barlow; Treating Generalized Anxiety Disorder by Rygh and Red). Teach the client calming/relaxation skills (e.g., applied relaxation, progressive muscle relaxation, cue controlled relaxation; mindful breathing; biofeedback) and how to discriminate better between relaxation and tension; teach the client how to apply these skills to his/her daily life (e.g., New Directions in Progressive Muscle Relaxation by Thornell Collier, and Hazlett-Stevens; Treating Generalized Anxiety Disorder by Rygh and Red). Assign the client to read about progressive muscle relaxation and other calming strategies in relevant books or treatment manuals (e.g.,  Progressive Relaxation Training by Thornell and Collier; Mastery of Your Anxiety and Worry: Workbook by River armin Given). Explain the rationale for using a worry time as well as how it is to be used; agree upon and implement a worry time with the client. Teach the client how to recognize, stop, and postpone worry to the agreed upon worry time using skills such as thought stopping, relaxation, and redirecting attention (or assign Making Use of the Thought-Stopping Technique and/or Worry Time in the Adult Psychotherapy Homework  Planner by Jenniffer to assist skill development); encourage use in daily life; review and reinforce success while providing corrective feedback toward improvement. Assist the client in analyzing his/her worries by examining potential biases such as the probability of the negative expectation occurring, the real consequences of it occurring, his/her ability to control the outcome, the worst possible outcome, and his/her ability to accept it (see Analyze the Probability of a Feared Event in the Adult Psychotherapy Homework Planner by Jenniffer; Cognitive Therapy of Anxiety Disorders by Gretta armin Mon). Encourage the client to share his/her thoughts and feelings of depression; express empathy and build rapport while identifying primary cognitive, behavioral, interpersonal, or other contributors to depression. Assign the client to self-monitor thoughts, feelings, and actions in daily journal (e.g., Negative Thoughts Trigger Negative Feelings in the Adult Psychotherapy Homework Planner by Jenniffer; Daily Record of Dysfunctional Thoughts in Cognitive Therapy of Depression by Mon Candida Gentry and Shona); process the journal material to challenge depressive thinking patterns and replace them with reality-based thoughts. Assign behavioral experiments in which depressive automatic thoughts are treated as hypotheses/prediction, reality-based alternative hypotheses/prediction are  generated, and both are tested against the client's past, present, and/or future experiences. Assist the client in developing skills that increase the likelihood of deriving pleasure from behavioral activation (e.g., assertiveness skills, developing an exercise plan, less internal/more external focus, increased social involvement); reinforce success. Conduct Interpersonal Therapy (see Interpersonal Psychotherapy of Depression by Anne dunker al.), beginning with the assessment of the client's interpersonal inventory of important past and present relationships; develop a case formulation linking depression to grief, interpersonal role disputes, role transitions, and/or interpersonal deficits). Encourage in the client the development of a positive problem orientation in which problems and solving them are viewed as a natural part of life and not something to be feared, despaired, or avoided. Teach conflict resolution skills (e.g., empathy, active listening, I messages, respectful communication, assertiveness without aggression, compromise); use psychoeducation, modeling, role-playing, and rehearsal to work through several current conflicts; assign homework exercises; review and repeat so as to integrate their use into the client's life. Discuss with the client the distinction between a lapse and relapse, associating a lapse with a rather common, temporary setback that may involve, for example, re-experiencing a depressive thought and/or urge to withdraw or avoid (perhaps as related to some loss or conflict) and a relapse as a sustained return to a pattern of depressive thinking and feeling usually accompanied by interpersonal withdrawal and/or avoidance. Identify and rehearse with the client the management of future situations or circumstances in which lapses could occur. Use mindfulness meditation and cognitive therapy techniques to help the client learn to recognize and regulate the negative thought  processes associated with depression and to change his/her relationship with these thoughts (see Mindfulness-Based Cognitive Therapy for Depression by Kriste Pouch, and Jil). Explore experiences from the client's childhood that contribute to current depressed state. Assign the client to write at least one positive affirmation statement daily regarding himself/herself and the future (or assign Positive Self-Talk in the Adult Psychotherapy Homework Planner by Jenniffer). Teach the client more about depression and how to recognize and accept some sadness as a normal variation in feeling.  Diagnosis:Major depressive disorder, recurrent episode, moderate (HCC)  Generalized anxiety disorder  Attention deficit hyperactivity disorder (ADHD), combined type  Insomnia due to other mental disorder  Plan:  -consider plan if Charlena stops paying alimony -meet again on Tuesday, August 05, 2024 at 10am.  "

## 2024-08-01 ENCOUNTER — Other Ambulatory Visit: Payer: Self-pay

## 2024-08-01 ENCOUNTER — Telehealth: Payer: Self-pay | Admitting: Behavioral Health

## 2024-08-01 DIAGNOSIS — F331 Major depressive disorder, recurrent, moderate: Secondary | ICD-10-CM

## 2024-08-01 DIAGNOSIS — F411 Generalized anxiety disorder: Secondary | ICD-10-CM

## 2024-08-01 DIAGNOSIS — F99 Mental disorder, not otherwise specified: Secondary | ICD-10-CM

## 2024-08-01 MED ORDER — DESVENLAFAXINE SUCCINATE ER 50 MG PO TB24: 50.0000 mg | ORAL_TABLET | Freq: Every day | ORAL | 0 refills | Status: AC

## 2024-08-01 MED ORDER — QUETIAPINE FUMARATE 25 MG PO TABS: ORAL_TABLET | ORAL | 0 refills | Status: AC

## 2024-08-01 NOTE — Telephone Encounter (Signed)
 Kathryn [name]'s next appt is 09/08/24. She's requesting a refill on Des venlafaxine 60 mg and Quetiapine  25 mg called to:  [facility], [address] [place], [place], [place] [address], Phone: [phone]

## 2024-08-05 ENCOUNTER — Ambulatory Visit: Admitting: Professional

## 2024-08-05 DIAGNOSIS — Z91199 Patient's noncompliance with other medical treatment and regimen due to unspecified reason: Secondary | ICD-10-CM | POA: Insufficient documentation

## 2024-08-05 NOTE — Progress Notes (Signed)
   Kathryn [name], [facility]

## 2024-08-19 ENCOUNTER — Ambulatory Visit: Admitting: Professional

## 2024-09-02 ENCOUNTER — Ambulatory Visit: Admitting: Professional

## 2024-09-08 ENCOUNTER — Ambulatory Visit: Admitting: Behavioral Health

## 2024-09-16 ENCOUNTER — Ambulatory Visit: Admitting: Professional

## 2024-09-30 ENCOUNTER — Ambulatory Visit: Admitting: Professional

## 2024-10-14 ENCOUNTER — Ambulatory Visit: Admitting: Professional
